# Patient Record
Sex: Male | Born: 1955
Health system: Southern US, Community
[De-identification: ages and names within clinical notes are randomized; demographics above are authoritative.]

## PROBLEM LIST (undated history)

## (undated) DIAGNOSIS — F329 Major depressive disorder, single episode, unspecified: Secondary | ICD-10-CM

## (undated) DIAGNOSIS — Z85828 Personal history of other malignant neoplasm of skin: Secondary | ICD-10-CM

## (undated) DIAGNOSIS — F32A Depression, unspecified: Secondary | ICD-10-CM

## (undated) DIAGNOSIS — A6002 Herpesviral infection of other male genital organs: Secondary | ICD-10-CM

## (undated) DIAGNOSIS — Z8619 Personal history of other infectious and parasitic diseases: Secondary | ICD-10-CM

## (undated) DIAGNOSIS — T7840XA Allergy, unspecified, initial encounter: Secondary | ICD-10-CM

## (undated) DIAGNOSIS — F1020 Alcohol dependence, uncomplicated: Secondary | ICD-10-CM

## (undated) DIAGNOSIS — R569 Unspecified convulsions: Secondary | ICD-10-CM

## (undated) DIAGNOSIS — C801 Malignant (primary) neoplasm, unspecified: Secondary | ICD-10-CM

## (undated) HISTORY — DX: Unspecified convulsions: R56.9

## (undated) HISTORY — DX: Malignant (primary) neoplasm, unspecified: C80.1

## (undated) HISTORY — DX: Allergy, unspecified, initial encounter: T78.40XA

## (undated) HISTORY — DX: Depression, unspecified: F32.A

## (undated) HISTORY — PX: FOOT FRACTURE SURGERY: SHX645

## (undated) HISTORY — PX: VASECTOMY: SHX75

## (undated) HISTORY — DX: Personal history of other malignant neoplasm of skin: Z85.828

## (undated) HISTORY — DX: Alcohol dependence, uncomplicated: F10.20

## (undated) HISTORY — PX: NOSE SURGERY: SHX723

## (undated) HISTORY — DX: Personal history of other infectious and parasitic diseases: Z86.19

## (undated) HISTORY — DX: Herpesviral infection of other male genital organs: A60.02

---
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
## 2001-06-15 ENCOUNTER — Ambulatory Visit (HOSPITAL_BASED_OUTPATIENT_CLINIC_OR_DEPARTMENT_OTHER): Admission: RE | Admit: 2001-06-15 | Discharge: 2001-06-15 | Payer: Self-pay | Admitting: Orthopedic Surgery

## 2001-10-21 ENCOUNTER — Encounter: Payer: Self-pay | Admitting: Emergency Medicine

## 2001-10-21 ENCOUNTER — Emergency Department (HOSPITAL_COMMUNITY): Admission: EM | Admit: 2001-10-21 | Discharge: 2001-10-21 | Payer: Self-pay | Admitting: Emergency Medicine

## 2002-04-25 ENCOUNTER — Emergency Department (HOSPITAL_COMMUNITY): Admission: EM | Admit: 2002-04-25 | Discharge: 2002-04-25 | Payer: Self-pay | Admitting: Emergency Medicine

## 2002-10-26 ENCOUNTER — Emergency Department (HOSPITAL_COMMUNITY): Admission: EM | Admit: 2002-10-26 | Discharge: 2002-10-26 | Payer: Self-pay | Admitting: Emergency Medicine

## 2002-10-27 ENCOUNTER — Ambulatory Visit (HOSPITAL_BASED_OUTPATIENT_CLINIC_OR_DEPARTMENT_OTHER): Admission: RE | Admit: 2002-10-27 | Discharge: 2002-10-27 | Payer: Self-pay | Admitting: Orthopedic Surgery

## 2003-01-07 ENCOUNTER — Emergency Department (HOSPITAL_COMMUNITY): Admission: EM | Admit: 2003-01-07 | Discharge: 2003-01-07 | Payer: Self-pay | Admitting: Emergency Medicine

## 2006-02-19 ENCOUNTER — Emergency Department (HOSPITAL_COMMUNITY): Admission: EM | Admit: 2006-02-19 | Discharge: 2006-02-19 | Payer: Self-pay | Admitting: Emergency Medicine

## 2010-10-03 ENCOUNTER — Emergency Department (HOSPITAL_COMMUNITY): Payer: Self-pay

## 2010-10-03 ENCOUNTER — Inpatient Hospital Stay (HOSPITAL_COMMUNITY)
Admission: EM | Admit: 2010-10-03 | Discharge: 2010-10-09 | DRG: 504 | Disposition: A | Discharge status: Home Health | Payer: Self-pay

## 2010-10-03 DIAGNOSIS — F1020 Alcohol dependence, uncomplicated: Secondary | ICD-10-CM | POA: Diagnosis present

## 2010-10-03 DIAGNOSIS — E876 Hypokalemia: Secondary | ICD-10-CM | POA: Diagnosis not present

## 2010-10-03 DIAGNOSIS — Z79899 Other long term (current) drug therapy: Secondary | ICD-10-CM

## 2010-10-03 DIAGNOSIS — F10939 Alcohol use, unspecified with withdrawal, unspecified: Secondary | ICD-10-CM | POA: Diagnosis present

## 2010-10-03 DIAGNOSIS — S92009A Unspecified fracture of unspecified calcaneus, initial encounter for closed fracture: Principal | ICD-10-CM | POA: Diagnosis present

## 2010-10-03 DIAGNOSIS — Y93H9 Activity, other involving exterior property and land maintenance, building and construction: Secondary | ICD-10-CM

## 2010-10-03 DIAGNOSIS — D62 Acute posthemorrhagic anemia: Secondary | ICD-10-CM | POA: Diagnosis not present

## 2010-10-03 DIAGNOSIS — Y92009 Unspecified place in unspecified non-institutional (private) residence as the place of occurrence of the external cause: Secondary | ICD-10-CM

## 2010-10-03 DIAGNOSIS — W138XXA Fall from, out of or through other building or structure, initial encounter: Secondary | ICD-10-CM | POA: Diagnosis present

## 2010-10-03 DIAGNOSIS — F10239 Alcohol dependence with withdrawal, unspecified: Secondary | ICD-10-CM | POA: Diagnosis present

## 2010-10-03 DIAGNOSIS — Z880 Allergy status to penicillin: Secondary | ICD-10-CM

## 2010-10-03 DIAGNOSIS — F17200 Nicotine dependence, unspecified, uncomplicated: Secondary | ICD-10-CM | POA: Diagnosis present

## 2010-10-03 LAB — BASIC METABOLIC PANEL
BUN: 6 mg/dL (ref 6–23)
CO2: 22 mEq/L (ref 19–32)
Calcium: 8.5 mg/dL (ref 8.4–10.5)
Chloride: 99 mEq/L (ref 96–112)
Creatinine, Ser: 0.56 mg/dL (ref 0.50–1.35)
GFR calc Af Amer: >60 mL/min (ref >60)
GFR calc non Af Amer: >60 mL/min (ref >60)
Glucose, Bld: 101 mg/dL — ABNORMAL HIGH (ref 70–99)
Potassium: 4 mEq/L (ref 3.5–5.1)
Sodium: 133 mEq/L — ABNORMAL LOW (ref 135–145)

## 2010-10-03 LAB — DIFFERENTIAL
Basophils Absolute: 0 10*3/uL (ref 0.0–0.1)
Basophils Relative: 0 % (ref 0–1)
Eosinophils Absolute: 0.1 10*3/uL (ref 0.0–0.7)
Eosinophils Relative: 1 % (ref 0–5)
Lymphocytes Relative: 21 % (ref 12–46)
Lymphs Abs: 2.9 10*3/uL (ref 0.7–4.0)
Monocytes Absolute: 0.9 10*3/uL (ref 0.1–1.0)
Monocytes Relative: 6 % (ref 3–12)
Neutro Abs: 10 10*3/uL — ABNORMAL HIGH (ref 1.7–7.7)
Neutrophils Relative %: 72 % (ref 43–77)

## 2010-10-03 LAB — ETHANOL: Alcohol, Ethyl (B): 236 mg/dL — ABNORMAL HIGH (ref 0–11)

## 2010-10-03 LAB — CBC
HCT: 42.9 % (ref 39.0–52.0)
Hemoglobin: 15.7 g/dL (ref 13.0–17.0)
MCH: 35 pg — ABNORMAL HIGH (ref 26.0–34.0)
MCHC: 36.6 g/dL — ABNORMAL HIGH (ref 30.0–36.0)
MCV: 95.8 fL (ref 78.0–100.0)
Platelets: 228 10*3/uL (ref 150–400)
RBC: 4.48 MIL/uL (ref 4.22–5.81)
RDW: 12.1 % (ref 11.5–15.5)
WBC: 14 10*3/uL — ABNORMAL HIGH (ref 4.0–10.5)

## 2010-10-03 LAB — SAMPLE TO BLOOD BANK

## 2010-10-04 ENCOUNTER — Inpatient Hospital Stay (HOSPITAL_COMMUNITY): Payer: Self-pay

## 2010-10-04 LAB — BASIC METABOLIC PANEL
BUN: 5 mg/dL — ABNORMAL LOW (ref 6–23)
CO2: 22 mEq/L (ref 19–32)
Calcium: 8.6 mg/dL (ref 8.4–10.5)
Chloride: 105 mEq/L (ref 96–112)
Creatinine, Ser: <0.47 mg/dL — ABNORMAL LOW (ref 0.50–1.35)
Glucose, Bld: 94 mg/dL (ref 70–99)
Potassium: 4.1 mEq/L (ref 3.5–5.1)
Sodium: 140 mEq/L (ref 135–145)

## 2010-10-04 LAB — SURGICAL PCR SCREEN
MRSA, PCR: NEGATIVE
Staphylococcus aureus: POSITIVE — AB

## 2010-10-04 LAB — CBC
HCT: 38.7 % — ABNORMAL LOW (ref 39.0–52.0)
Hemoglobin: 13.8 g/dL (ref 13.0–17.0)
MCH: 34.3 pg — ABNORMAL HIGH (ref 26.0–34.0)
MCHC: 35.7 g/dL (ref 30.0–36.0)
MCV: 96.3 fL (ref 78.0–100.0)
Platelets: 210 10*3/uL (ref 150–400)
RBC: 4.02 MIL/uL — ABNORMAL LOW (ref 4.22–5.81)
RDW: 12.3 % (ref 11.5–15.5)
WBC: 12.1 10*3/uL — ABNORMAL HIGH (ref 4.0–10.5)
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
## 2010-10-05 ENCOUNTER — Inpatient Hospital Stay (HOSPITAL_COMMUNITY): Payer: Self-pay

## 2010-10-06 LAB — BASIC METABOLIC PANEL
Calcium: 8.5 mg/dL (ref 8.4–10.5)
Chloride: 101 mEq/L (ref 96–112)
Creatinine, Ser: 0.48 mg/dL — ABNORMAL LOW (ref 0.50–1.35)
GFR calc Af Amer: >60 mL/min (ref >60)
Sodium: 137 mEq/L (ref 135–145)

## 2010-10-06 LAB — CBC
HCT: 30.3 % — ABNORMAL LOW (ref 39.0–52.0)
Hemoglobin: 10.8 g/dL — ABNORMAL LOW (ref 13.0–17.0)
MCH: 34.2 pg — ABNORMAL HIGH (ref 26.0–34.0)
MCHC: 35.6 g/dL (ref 30.0–36.0)
MCV: 95.9 fL (ref 78.0–100.0)
Platelets: 151 10*3/uL (ref 150–400)
RBC: 3.16 MIL/uL — ABNORMAL LOW (ref 4.22–5.81)
RDW: 11.8 % (ref 11.5–15.5)
WBC: 13.4 10*3/uL — ABNORMAL HIGH (ref 4.0–10.5)

## 2010-10-07 LAB — COMPREHENSIVE METABOLIC PANEL
ALT: 20 U/L (ref 0–53)
Albumin: 2.9 g/dL — ABNORMAL LOW (ref 3.5–5.2)
Alkaline Phosphatase: 70 U/L (ref 39–117)
Chloride: 102 mEq/L (ref 96–112)
Potassium: 3.1 mEq/L — ABNORMAL LOW (ref 3.5–5.1)
Sodium: 139 mEq/L (ref 135–145)
Total Bilirubin: 1.4 mg/dL — ABNORMAL HIGH (ref 0.3–1.2)
Total Protein: 6.1 g/dL (ref 6.0–8.3)

## 2010-10-07 LAB — CBC
HCT: 30.3 % — ABNORMAL LOW (ref 39.0–52.0)
Platelets: 186 10*3/uL (ref 150–400)
RBC: 3.2 MIL/uL — ABNORMAL LOW (ref 4.22–5.81)
RDW: 11.6 % (ref 11.5–15.5)
WBC: 10 10*3/uL (ref 4.0–10.5)
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
## 2010-10-16 ENCOUNTER — Encounter (HOSPITAL_COMMUNITY)
Admission: RE | Admit: 2010-10-16 | Discharge: 2010-10-16 | Disposition: A | Discharge status: Home / Self Care | Payer: Self-pay | Source: Ambulatory Visit | Attending: Orthopedic Surgery | Admitting: Orthopedic Surgery

## 2010-10-16 LAB — BASIC METABOLIC PANEL
BUN: 3 mg/dL — ABNORMAL LOW (ref 6–23)
Calcium: 9.6 mg/dL (ref 8.4–10.5)
Creatinine, Ser: 0.49 mg/dL — ABNORMAL LOW (ref 0.50–1.35)
GFR calc Af Amer: >60 mL/min (ref >60)
GFR calc non Af Amer: >60 mL/min (ref >60)

## 2010-10-16 LAB — CBC
HCT: 38.7 % — ABNORMAL LOW (ref 39.0–52.0)
MCH: 33.7 pg (ref 26.0–34.0)
MCHC: 35.7 g/dL (ref 30.0–36.0)
MCV: 94.4 fL (ref 78.0–100.0)
RDW: 12.5 % (ref 11.5–15.5)

## 2010-10-16 LAB — SURGICAL PCR SCREEN: Staphylococcus aureus: NEGATIVE

## 2010-10-18 ENCOUNTER — Ambulatory Visit (HOSPITAL_COMMUNITY): Payer: Self-pay

## 2010-10-18 ENCOUNTER — Ambulatory Visit (HOSPITAL_COMMUNITY)
Admission: RE | Admit: 2010-10-18 | Discharge: 2010-10-19 | Disposition: A | Discharge status: Home / Self Care | Payer: Self-pay | Source: Ambulatory Visit | Attending: Orthopedic Surgery | Admitting: Orthopedic Surgery

## 2010-10-18 DIAGNOSIS — Y999 Unspecified external cause status: Secondary | ICD-10-CM | POA: Insufficient documentation

## 2010-10-18 DIAGNOSIS — W138XXA Fall from, out of or through other building or structure, initial encounter: Secondary | ICD-10-CM | POA: Insufficient documentation

## 2010-10-18 DIAGNOSIS — S92009A Unspecified fracture of unspecified calcaneus, initial encounter for closed fracture: Secondary | ICD-10-CM | POA: Insufficient documentation

## 2010-10-18 DIAGNOSIS — F17200 Nicotine dependence, unspecified, uncomplicated: Secondary | ICD-10-CM | POA: Insufficient documentation

## 2010-10-18 DIAGNOSIS — Z01812 Encounter for preprocedural laboratory examination: Secondary | ICD-10-CM | POA: Insufficient documentation
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
## 2010-10-31 ENCOUNTER — Inpatient Hospital Stay (HOSPITAL_COMMUNITY)
Admission: AD | Admit: 2010-10-31 | Discharge: 2010-11-04 | DRG: 909 | Disposition: A | Discharge status: Home Health | Payer: Self-pay | Source: Ambulatory Visit | Attending: Orthopedic Surgery | Admitting: Orthopedic Surgery

## 2010-10-31 DIAGNOSIS — Y849 Medical procedure, unspecified as the cause of abnormal reaction of the patient, or of later complication, without mention of misadventure at the time of the procedure: Secondary | ICD-10-CM | POA: Diagnosis present

## 2010-10-31 DIAGNOSIS — F17200 Nicotine dependence, unspecified, uncomplicated: Secondary | ICD-10-CM | POA: Diagnosis present

## 2010-10-31 DIAGNOSIS — T8131XA Disruption of external operation (surgical) wound, not elsewhere classified, initial encounter: Principal | ICD-10-CM | POA: Diagnosis present

## 2010-10-31 DIAGNOSIS — IMO0002 Reserved for concepts with insufficient information to code with codable children: Secondary | ICD-10-CM

## 2010-10-31 DIAGNOSIS — F1010 Alcohol abuse, uncomplicated: Secondary | ICD-10-CM | POA: Diagnosis present

## 2010-10-31 LAB — CBC
HCT: 41.2 % (ref 39.0–52.0)
Hemoglobin: 14.6 g/dL (ref 13.0–17.0)
MCHC: 35.4 g/dL (ref 30.0–36.0)
MCV: 92.4 fL (ref 78.0–100.0)

## 2010-11-01 ENCOUNTER — Inpatient Hospital Stay (HOSPITAL_COMMUNITY): Admission: RE | Admit: 2010-11-01 | Payer: Self-pay | Source: Ambulatory Visit | Admitting: Orthopedic Surgery

## 2010-11-01 LAB — BASIC METABOLIC PANEL
CO2: 29 mEq/L (ref 19–32)
Calcium: 9.6 mg/dL (ref 8.4–10.5)
GFR calc non Af Amer: >60 mL/min (ref >60)
Sodium: 139 mEq/L (ref 135–145)

## 2010-11-01 LAB — PROTIME-INR: INR: 1.03 (ref 0.00–1.49)

## 2010-11-01 LAB — SURGICAL PCR SCREEN: Staphylococcus aureus: POSITIVE — AB

## 2010-11-02 NOTE — H&P (Signed)
  NAMEJAZZ, Edwin Torres NO.:  [id number]  MEDICAL RECORD NO.:  [id number]  LOCATION:  5033                         FACILITY:  [facility]  PHYSICIAN:  Toni Arthurs, MD        DATE OF BIRTH:  04-24-1955  DATE OF ADMISSION:  10/31/2010 DATE OF DISCHARGE:                             HISTORY & PHYSICAL   HISTORY OF PRESENT ILLNESS:  The patient is a 55 year old male who is now 2 weeks status post ORIF of his left calcaneus fracture.  He presented to clinic today for a wound check and convert into a cast.  He was found to have a dehisced left heel wound and is admitted now for I and D and application of a wound VAC.  ADMISSION DIAGNOSES: 1. Left foot wound dehiscence status post open reduction and internal     fixation of left calcaneus fracture. 2. Status post open reduction and internal fixation right calcaneus     fracture. 3. History of alcohol abuse. 4. History of tobacco use. 5. History of alcohol withdrawal syndrome on his most recent     admission.  PAST SURGICAL HISTORY:  As above, ORIF of multiple facial fractures in the past.  SOCIAL HISTORY:  The patient is self-employed.  He smokes about three cigarettes a day.  Currently, he drinks 4-6 beers a day.  FAMILY HISTORY:  Unknown.  REVIEW OF SYSTEMS:  No recent fever, chills, nausea, vomiting or changes in his appetite.  Review of systems as above and otherwise normal.  MEDICATIONS:  Admission medications Xarelto, Dilaudid, Tylenol.  PHYSICAL EXAMINATION:  GENERAL:  The patient is a well-nourished, well- developed male in no apparent distress, alert and oriented x4. EYES:  Extraocular motions are intact. RESPIRATIONS:  Unlabored. NEUROLOGIC:  His gait is nonweightbearing on both lower extremities.  He has a splint on his left lower extremity and a cast on the right.  The left lower extremity cast is removed.  The posterolateral heel incision has extensive necrosis along the suture line with significant  fibrinous exudate.  The posterolateral corner has a fairly deep hole but no metal is evident.  There is no evidence of cellulitis surrounding this area. Pulses are palpable.  Sensibility to light touch is intact in the medial and lateral plantar nerve distribution.  No lymphadenopathy is noted. He has 5/5 strength.  Plantar flexion and dorsiflexion at the ankle.  ASSESSMENT:  Wound dehiscence left foot status post open reduction and internal fixation of left heel.  PLAN:  At this point, the patient is admitted to the inpatient ward 5000 at [facility] directly from clinic.  Will be n.p.o. overnight.  He will be taken to surgery tomorrow for I and D of the left heel wound with likely application of a wound VAC.  He understands this plan and agrees.     Toni Arthurs, MD     JH/MEDQ  D:  10/31/2010  T:  11/01/2010  Job:  [id number]  Electronically Signed by Toni Arthurs  on 11/02/2010 08:51:20 AM

## 2010-11-02 NOTE — Op Note (Signed)
NAMENICCOLAS, Torres NO.:  [id number]  MEDICAL RECORD NO.:  [id number]  LOCATION:  5033                         FACILITY:  [facility]  PHYSICIAN:  Toni Arthurs, MD        DATE OF BIRTH:  [birth year]  DATE OF PROCEDURE:  11/01/2010 DATE OF DISCHARGE:                              OPERATIVE REPORT   PREOPERATIVE DIAGNOSIS:  Left foot wound dehiscence status post open reduction and internal fixation of left calcaneus fracture.  POSTOPERATIVE DIAGNOSIS:  Left foot wound dehiscence status post open reduction and internal fixation of left calcaneus fracture.  PROCEDURE: 1. Incision and drainage of left foot, postoperative wound including     skin, subcutaneous tissue, muscle, and bone. 2. Application of wound VAC to left foot wound.  SURGEON:  Toni Arthurs, MD  ANESTHESIA:  General.  IV FLUIDS:  See anesthesia record.  ESTIMATED BLOOD LOSS:  Minimal.  COMPLICATIONS:  None apparent.  DISPOSITION:  Extubated, awakened, stable to recovery.  INDICATIONS FOR PROCEDURE:  The patient is a 55 year old male who is now 2 weeks status post ORIF of his left calcaneus fracture.  He has a long history of cigarette smoking and has continued to smoke in the postoperative period.  He presented to clinic yesterday for routine followup and was found to have dehisced left hindfoot wound.  He was admitted to the hospital, was started on vancomycin given his PENICILLIN allergy.  He presents now for I and D of this wound and application of a wound VAC.  He understands the risks and benefits of this procedure including risks of bleeding, infection, nerve damage, blood clots, need for additional surgery, amputation and death.  He elects to proceed.  PROCEDURE IN DETAIL:  After preoperative consent was obtained and the correct operative site was identified, the patient was brought to the operating room and placed supine on the operating table.  General anesthesia was induced.  The  patient was already on therapeutic antibiotics.  A surgical time-out was taken.  The patient was then turned into the lateral decubitus position with the left side up.  The left lower extremity was prepped and draped in standard sterile fashion. The patient's previous surgical incision was reopened.  There was fibrinous exudate noted at the superficial aspect of the wound, but no gross purulence was noted.  There is no evidence of surrounding cellulitis.  The wound edges appeared necrotic.  The wound was circumferentially debrided from the level of the skin down through the level of subcutaneous tissue, muscle, and bone.  The wound was irrigated copiously with 3 liters of normal saline.  Hemostasis was achieved. Inverted simple sutures of #2 PDS were used to approximate the subcutaneous tissue.  The distal and proximal ends of the wound were closed in tension-free manner with horizontal mattress sutures of 3-0 nylon.  The most central portion of the wound on the inferior end could not be closed due to the extensive skin loss in this area.  A wound VAC sponge was cut to fit this hole, which measured 1 cm x 27 mm x 1 cm deep.  The proximal and distal extent of the suture lines were covered with Adaptic.  The wound VAC sponge was cut to fit the full length of the incision and a sponge was placed in the central wound and then over the pieces of Adaptic over the proximal and distal ends of the incisions.  The wound VAC occlusive dressing was applied followed by 125 mmHg of vacuum.  An appropriate seal was achieved.  Sterile dressings were applied followed by a posterior splint.  The patient was then awakened by Anesthesia and transported to recovery room in stable condition.  FOLLOWUP PLAN:  The patient will be discharged home on oral antibiotics. He will have wound VAC changes on Monday, Wednesday, Friday and will follow up with me in approximately 2 weeks.     Toni Arthurs,  MD     JH/MEDQ  D:  11/01/2010  T:  11/02/2010  Job:  [id number]  Electronically Signed by Jonny Ruiz Kalle Bernath  on 11/02/2010 08:51:23 AM

## 2010-11-04 LAB — VANCOMYCIN, TROUGH: Vancomycin Tr: 7.9 ug/mL — ABNORMAL LOW (ref 10.0–20.0)

## 2010-11-04 NOTE — Discharge Summary (Signed)
NAMESTEEL, Edwin Torres  MEDICAL RECORD NO.:  [id number]  LOCATION:  5022                         FACILITY:  [facility]  PHYSICIAN:  Edwin Torres, M.D.    DATE OF BIRTH:  November 23, 1955  DATE OF ADMISSION:  10/03/2010 DATE OF DISCHARGE:  10/09/2010                              DISCHARGE SUMMARY   The patient was discharged home with Home Health Services and followup.  ADMITTING TRAUMA SURGEON:  Juanetta Gosling, MD  CONSULTANTS:  Toni Arthurs, MD and Almedia Balls. Ranell Patrick, MD, Orthopedic Surgery.  PROCEDURES:  ORIF right calcaneus fracture and splinting of left calcaneus fracture on October 05, 2010 with Dr. Victorino Dike.  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  He reports hearing pop in both of his ankles upon landing.  He is complaining of severe pain in both his feet.  He has no other complaints of pain.  Workup in the ED including bilateral lower extremity x-rays did show bilateral severely comminuted calcaneal fractures.  He had plain films of his cervical and lumbar spine which were negative for acute fractures.  The patient was admitted to the Trauma Service.  He was seen in consultation by Dr. Ranell Patrick of Orthopedic Surgery and was admitted for pain control and probable surgery.  He was initially started on a PCA. He did have significant history of EtOH use and was started on an Ativan, alcohol withdrawal protocol as well as allowed to drink beer with which he was normal.  He was then seen in consultation by Dr. Victorino Dike and it was felt he would benefit from ORIF and he was taken to the OR for ORIF of his right calcaneus fracture and application of a short-leg splint to the left lower extremity per Dr. Victorino Dike on October 05, 2010.  The left lower extremity could not be operated at this time due to skin issues  and the patient was maintained nonweightbearing on both lower extremities.  He was mobilized bed to chair with therapies and is doing well enough that he can be discharged home with the assistance of his family at this point.  He did have a lot of pain control issues and was started on OxyContin sustained release 20 mg b.i.d. by Dr. Victorino Dike as well as Lisabeth Devoid for breakthrough pain and Celebrex.  He will be discharged on all these medications.  He will be following up with Dr. Victorino Dike early next week to follow up on the ORIF of his right ankle and for wound recheck on the left side as this will likely need to be done in delayed fashion.  Alcohol withdrawal syndrome.  The patient did develop significant alcohol withdrawal syndrome in the postoperative course.  This took several days to clear and once he cleared sufficiently, he was able to fully participate in therapies and is ready at this point for discharge home.  MEDICATIONS AT TIME OF DISCHARGE: 1. Celebrex 200 mg p.o. b.i.d.  He was given a prescription for 60, no     refill. 2.  Benadryl 25 mg to 50 mg p.o. q.6 h. p.r.n. itching. 3. Colace 100 mg p.o. b.i.d. 4. Multivitamin 1 p.o. daily. 5. Oxycodone 20 mg sustained release tablets b.i.d., #20, no refill.     Further refills will need to be per Dr. Victorino Dike as he feels     necessary. 6. OxyIR 5 mg tablets, 10-15 mg p.o. q.3 h. p.r.n. breakthrough pain,     #80, no refill. 7. Senna tabs 2 daily.  Again the patient is going to follow up with Dr. Victorino Dike early next week. He will follow up with Trauma Service on as-needed basis.  He is nonweightbearing, bilateral lower extremities.  Diet is regular as tolerated.  He was seen by the social worker during this admission for multiple issues but substance abuse was also addressed.  The patient will pursue this community resources for assistance with alcohol dependence after discharge as he does wish some assistance with this.  It was  discussed with both the patient and his wife that he should not abruptly stop drinking as he had significant symptoms of alcohol withdrawal syndrome and this could be potentially life threatening.     Shawn Rayburn, P.A.   ______________________________ Edwin Torres, M.D.    SR/MEDQ  D:  10/09/2010  T:  10/09/2010  Job:  [id number]  cc:   Toni Arthurs, MD [facility] Surgery  Electronically Signed by Lazaro Arms P.A. on 10/15/2010 06:09:27 PM Electronically Signed by Jimmye Norman M.D. on 11/04/2010 08:46:47 AM

## 2010-11-07 NOTE — Discharge Summary (Signed)
  NAMEBRAYLEE, Edwin Torres NO.:  [id number]  MEDICAL RECORD NO.:  [id number]  LOCATION:  5033                         FACILITY:  [facility]  PHYSICIAN:  Toni Arthurs, MD        DATE OF BIRTH:  03-05-1955  DATE OF ADMISSION:  10/31/2010 DATE OF DISCHARGE:  11/04/2010                              DISCHARGE SUMMARY   ADMISSION DIAGNOSES: 1. Left foot wound dehiscence status post open reduction and internal     fixation of the left calcaneus fracture. 2. Status post open reduction and internal fixation right calcaneus     fracture. 3. History of alcohol abuse. 4. History of tobacco use. 5. History of alcohol withdrawal syndrome.  DISCHARGE DIAGNOSES: 1. Left foot wound dehiscence status post open reduction and internal     fixation of the left calcaneus fracture. 2. Status post open reduction and internal fixation right calcaneus     fracture. 3. History of alcohol abuse. 4. History of tobacco use. 5. History of alcohol withdrawal syndrome. 6. Status post incision and drainage of left foot wound with     application of wound VAC.  HOSPITAL COURSE:  The patient was admitted to the hospital on October 31, 2010, directly from clinic.  He was found to have a dehiscence of his left heel wound.  He was taken to surgery the following day where he underwent I and D of the left foot wound with application of a wound VAC.  He has been maintained on IV vancomycin, then transitioned to oral Bactrim.  He now has home health wound VAC treatment set up and is stable for discharge.  He is discharged home in good condition tonight.  DISCHARGE CONDITION:  Stable.  WEIGHTBEARING STATUS:  Nonweightbearing bilateral lower extremities.  FOLLOWUP PLAN:  The patient will have the left foot wound VAC changed Monday, Wednesday, Friday by [facility].  He will go back on his oral anticoagulant once a day.  He will be on Bactrim DS one p.o. b.i.d.  He will follow up with me on  November 15, 2010, in clinic.  DISCHARGE MEDICATIONS:  See medication reconciliation.     Toni Arthurs, MD     JH/MEDQ  D:  11/04/2010  T:  11/05/2010  Job:  [id number]  Electronically Signed by Toni Arthurs  on 11/07/2010 03:29:09 PM

## 2012-06-09 IMAGING — RF DG OS CALCIS 2+V*L*
1 series · 3 of 3 positions shown · non-contrast
Comparison: 10/03/2010.

CLINICAL DATA: ORIF left calcaneal fracture.

LEFT OS CALCIS - 2+ VIEW

[Series 1: run · 3 of 3 slices shown]
[im 1/3]
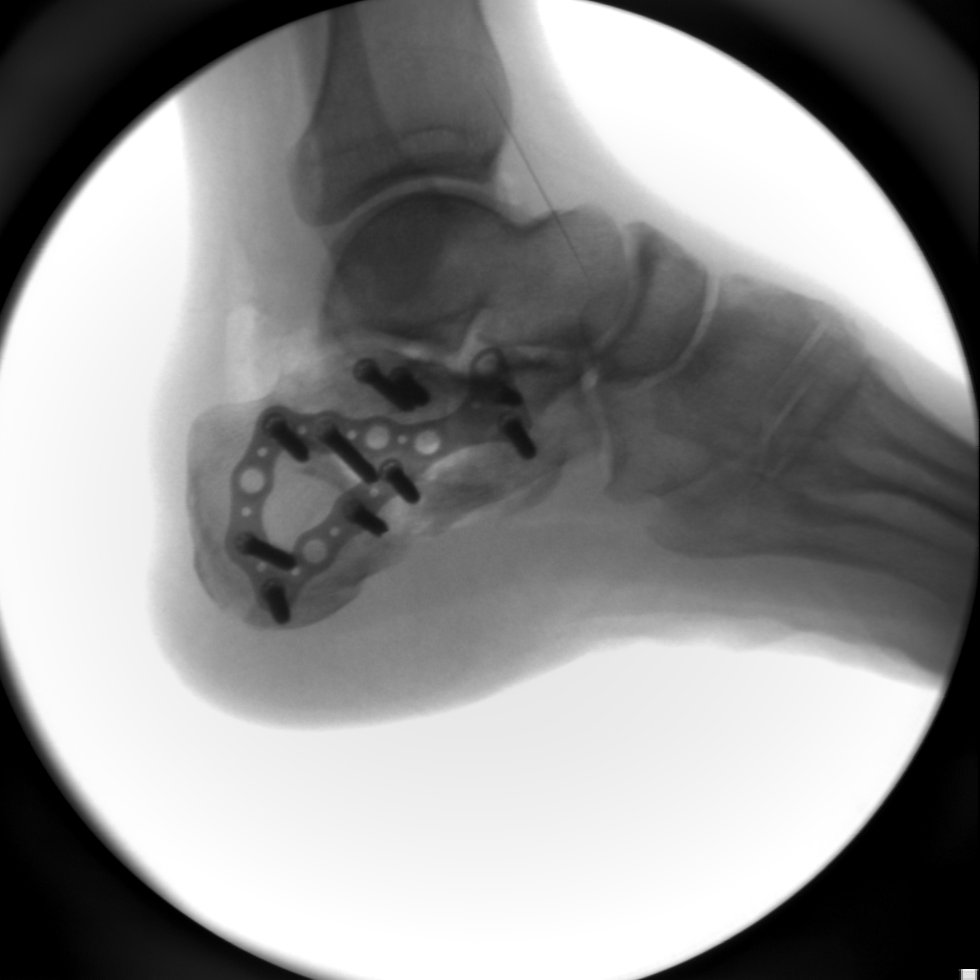
[im 2/3]
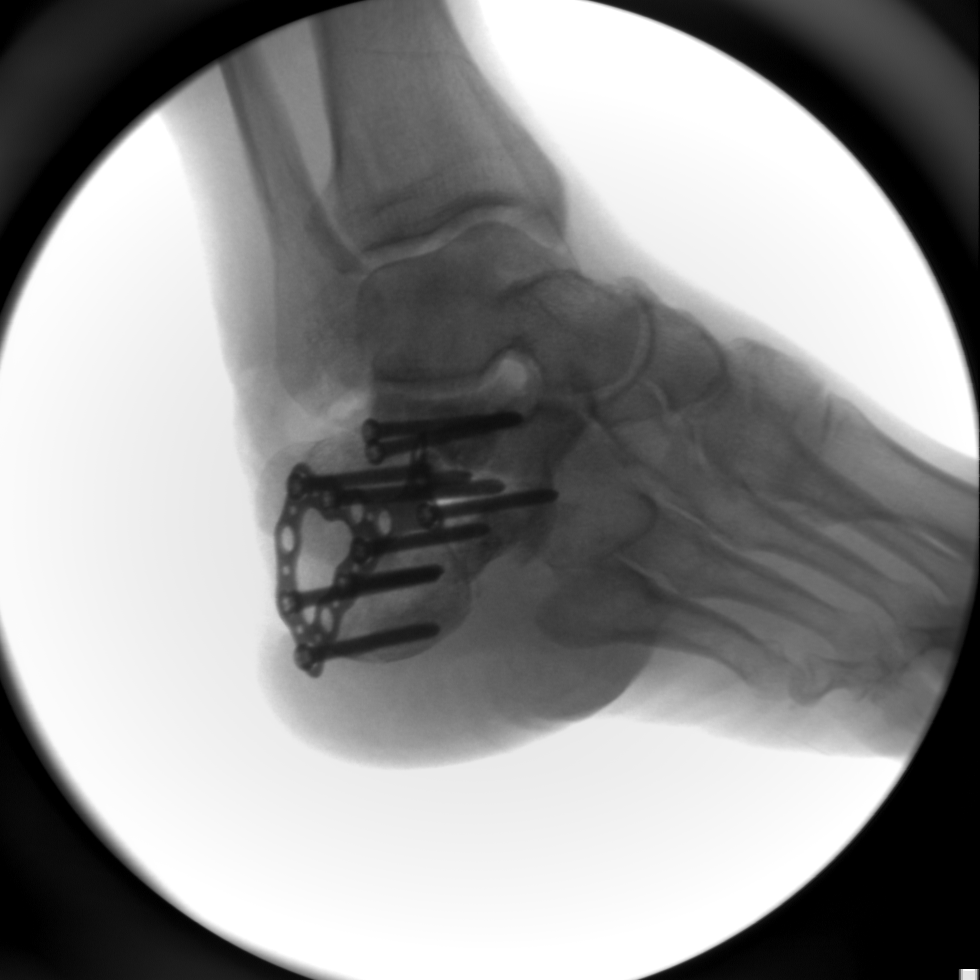
[im 3/3]
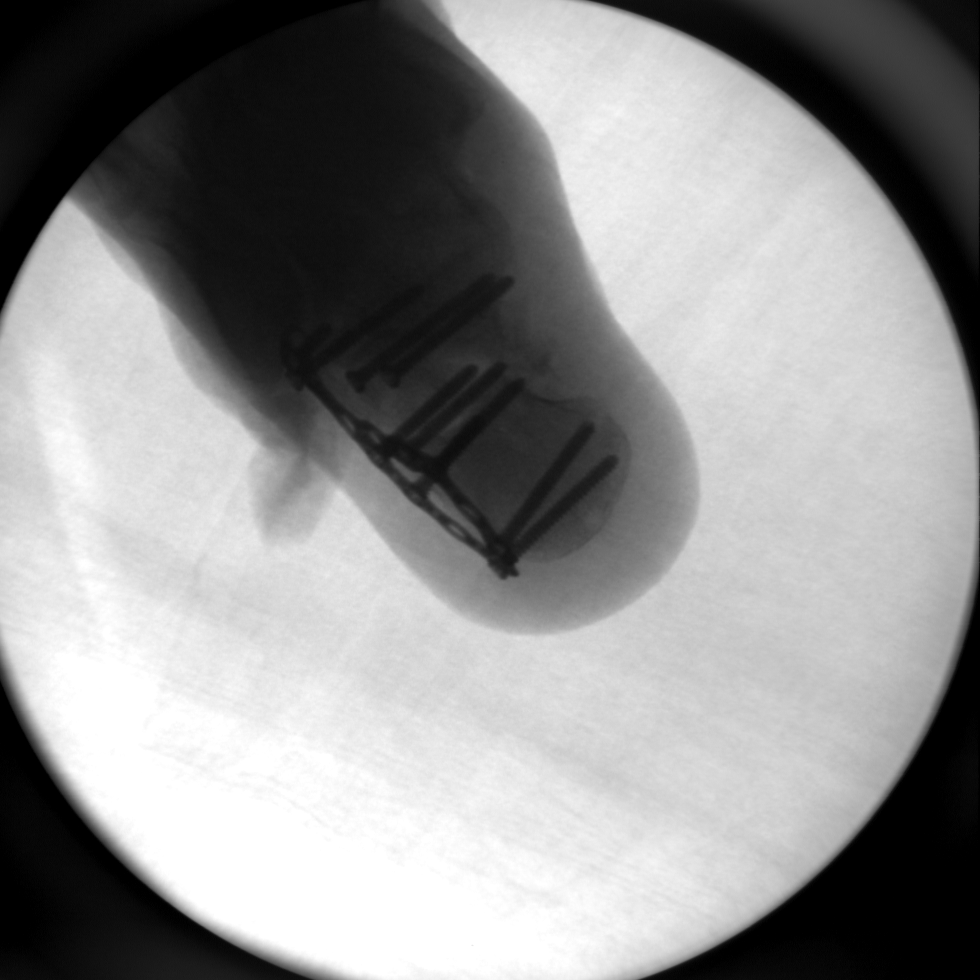

[3 of 3 positions shown; findings below may reference images not displayed]

FINDINGS: Three intraoperative fluoroscopic spot films demonstrate
ORIF of comminuted calcaneal fracture of the lateral plate and
screw fixation.  There are at least 2 screws that do not extend
through the calcaneal plate, compatible with interfragmentary
screws.  Expected soft tissue changes.
IMPRESSION: ORIF left calcaneus.

## 2016-03-10 ENCOUNTER — Ambulatory Visit (INDEPENDENT_AMBULATORY_CARE_PROVIDER_SITE_OTHER): Payer: PPO | Admitting: Emergency Medicine

## 2016-03-10 VITALS — BP 140/80 | HR 110 | Temp 97.7°F | Resp 18 | Ht 66.0 in | Wt 140.0 lb

## 2016-03-10 DIAGNOSIS — H1031 Unspecified acute conjunctivitis, right eye: Secondary | ICD-10-CM

## 2016-03-10 MED ORDER — TOBRAMYCIN 0.3 % OP SOLN: 2.0000 [drp] | Freq: Four times a day (QID) | OPHTHALMIC | 0 refills | Status: AC

## 2016-03-10 MED ORDER — PREDNISONE 20 MG PO TABS: 40.0000 mg | ORAL_TABLET | Freq: Every day | ORAL | 0 refills | Status: AC

## 2016-03-10 NOTE — Progress Notes (Signed)
Edwin Torres 61 y.o.   Chief Complaint  Patient presents with  . eye infection    2 weeks    HISTORY OF PRESENT ILLNESS: This is a 61 y.o. male complaining of redness, itching, and discharge to right eye for several days.  Conjunctivitis   The current episode started 3 to 5 days ago. The onset was gradual. The problem occurs continuously. The problem has been gradually worsening. The problem is mild. Associated symptoms include eye itching, congestion, eye discharge and eye redness. Pertinent negatives include no fever, no decreased vision, no double vision, no photophobia, no abdominal pain, no nausea, no vomiting, no headaches, no rhinorrhea, no sore throat, no neck pain, no neck stiffness, no cough, no URI, no rash and no eye pain. The eye pain is mild. The right eye is affected. The eye pain is not associated with movement. The eyelid exhibits swelling.     Prior to Admission medications   Not on File    Allergies  Allergen Reactions  . Penicillins Anaphylaxis    There are no active problems to display for this patient.   Past Medical History:  Diagnosis Date  . Seizures ([place])     Past Surgical History:  Procedure Laterality Date  . VASECTOMY      Social History   Social History  . Marital status: Married    Spouse name: N/A  . Number of children: N/A  . Years of education: N/A   Occupational History  . Not on file.   Social History Main Topics  . Smoking status: Current Every Day Smoker  . Smokeless tobacco: Never Used  . Alcohol use Yes  . Drug use: No  . Sexual activity: Not on file   Other Topics Concern  . Not on file   Social History Narrative  . No narrative on file    Family History  Problem Relation Age of Onset  . Cancer Mother   . Diabetes Mother   . Hyperlipidemia Father      Review of Systems  Constitutional: Negative for chills and fever.  HENT: Positive for congestion. Negative for rhinorrhea and sore throat.   Eyes:  Positive for discharge, redness and itching. Negative for blurred vision, double vision, photophobia and pain.  Respiratory: Negative for cough.   Cardiovascular: Negative for chest pain and palpitations.  Gastrointestinal: Negative for abdominal pain, nausea and vomiting.  Genitourinary: Negative.   Musculoskeletal: Negative for neck pain.  Skin: Negative for rash.  Neurological: Negative for headaches.  Endo/Heme/Allergies: Negative.   Psychiatric/Behavioral: Negative.    Vitals:   03/10/16 1727  BP: 140/80  Pulse: (!) 110  Resp: 18  Temp: 97.7 F (36.5 C)    Physical Exam  Constitutional: He is oriented to person, place, and time. He appears well-developed and well-nourished.  HENT:  Head: Normocephalic and atraumatic.  Eyes: EOM are normal. Pupils are equal, round, and reactive to light. Right conjunctiva is injected.  Right eyelid edema  Neck: Normal range of motion. Neck supple.  Cardiovascular: Normal rate, regular rhythm and normal heart sounds.   Pulmonary/Chest: Effort normal and breath sounds normal.  Musculoskeletal: Normal range of motion.  Neurological: He is alert and oriented to person, place, and time.  Skin: Skin is warm and dry.  Psychiatric: He has a normal mood and affect. His behavior is normal.  Vitals reviewed.    ASSESSMENT & PLAN: Edwin Torres was seen today for eye infection.  Diagnoses and all orders for this visit:  Acute conjunctivitis of right eye, unspecified acute conjunctivitis type -     Ambulatory referral to Ophthalmology  Other orders -     tobramycin (TOBREX) 0.3 % ophthalmic solution; Place 2 drops into the right eye every 6 (six) hours. -     predniSONE (DELTASONE) 20 MG tablet; Take 2 tablets (40 mg total) by mouth daily with breakfast.    Patient Instructions       IF you received an x-ray today, you will receive an invoice from [facility]. Please contact [facility] at [phone] with questions or  concerns regarding your invoice.   IF you received labwork today, you will receive an invoice from [facility]. Please contact [facility] at [phone] with questions or concerns regarding your invoice.   Our billing staff will not be able to assist you with questions regarding bills from these companies.  You will be contacted with the lab results as soon as they are available. The fastest way to get your results is to activate your My Chart account. Instructions are located on the last page of this paperwork. If you have not heard from [place] regarding the results in 2 weeks, please contact this office.     Bacterial Conjunctivitis Introduction Bacterial conjunctivitis is an infection of your conjunctiva. This is the clear membrane that covers the white part of your eye and the inner surface of your eyelid. This condition can make your eye:  Red or pink.  Itchy. This condition is caused by bacteria. This condition spreads very easily from person to person (is contagious) and from one eye to the other eye. Follow these instructions at home: Medicines  Take or apply your antibiotic medicine as told by your doctor. Do not stop taking or applying the antibiotic even if you start to feel better.  Take or apply over-the-counter and prescription medicines only as told by your doctor.  Do not touch your eyelid with the eye drop bottle or the ointment tube. Managing discomfort  Wipe any fluid from your eye with a warm, wet washcloth or a cotton ball.  Place a cool, clean washcloth on your eye. Do this for 10-20 minutes, 3-4 times per day. General instructions  Do not wear contact lenses until the irritation is gone. Wear glasses until your doctor says it is okay to wear contacts.  Do not wear eye makeup until your symptoms are gone. Throw away any old makeup.  Change or wash your pillowcase every day.  Do not share towels or washcloths with anyone.  Wash your hands often with soap and  water. Use paper towels to dry your hands.  Do not touch or rub your eyes.  Do not drive or use heavy machinery if your vision is blurry. Contact a doctor if:  You have a fever.  Your symptoms do not get better after 10 days. Get help right away if:  You have a fever and your symptoms suddenly get worse.  You have very bad pain when you move your eye.  Your face:  Hurts.  Is red.  Is swollen.  You have sudden loss of vision. This information is not intended to replace advice given to you by your health care provider. Make sure you discuss any questions you have with your health care provider. Document Released: 11/20/2007 Document Revised: 07/19/2015 Document Reviewed: 11/23/2014  2017 Elsevier      Agustina Caroli, MD [facility]

## 2016-03-10 NOTE — Patient Instructions (Addendum)
     IF you received an x-ray today, you will receive an invoice from [facility]. Please contact [facility] at [phone] with questions or concerns regarding your invoice.   IF you received labwork today, you will receive an invoice from [facility]. Please contact [facility] at [phone] with questions or concerns regarding your invoice.   Our billing staff will not be able to assist you with questions regarding bills from these companies.  You will be contacted with the lab results as soon as they are available. The fastest way to get your results is to activate your My Chart account. Instructions are located on the last page of this paperwork. If you have not heard from [place] regarding the results in 2 weeks, please contact this office.     Bacterial Conjunctivitis Introduction Bacterial conjunctivitis is an infection of your conjunctiva. This is the clear membrane that covers the white part of your eye and the inner surface of your eyelid. This condition can make your eye:  Red or pink.  Itchy. This condition is caused by bacteria. This condition spreads very easily from person to person (is contagious) and from one eye to the other eye. Follow these instructions at home: Medicines  Take or apply your antibiotic medicine as told by your doctor. Do not stop taking or applying the antibiotic even if you start to feel better.  Take or apply over-the-counter and prescription medicines only as told by your doctor.  Do not touch your eyelid with the eye drop bottle or the ointment tube. Managing discomfort  Wipe any fluid from your eye with a warm, wet washcloth or a cotton ball.  Place a cool, clean washcloth on your eye. Do this for 10-20 minutes, 3-4 times per day. General instructions  Do not wear contact lenses until the irritation is gone. Wear glasses until your doctor says it is okay to wear contacts.  Do not wear eye makeup until your symptoms are gone.  Throw away any old makeup.  Change or wash your pillowcase every day.  Do not share towels or washcloths with anyone.  Wash your hands often with soap and water. Use paper towels to dry your hands.  Do not touch or rub your eyes.  Do not drive or use heavy machinery if your vision is blurry. Contact a doctor if:  You have a fever.  Your symptoms do not get better after 10 days. Get help right away if:  You have a fever and your symptoms suddenly get worse.  You have very bad pain when you move your eye.  Your face:  Hurts.  Is red.  Is swollen.  You have sudden loss of vision. This information is not intended to replace advice given to you by your health care provider. Make sure you discuss any questions you have with your health care provider. Document Released: 11/20/2007 Document Revised: 07/19/2015 Document Reviewed: 11/23/2014  2017 Elsevier

## 2016-03-18 ENCOUNTER — Emergency Department (HOSPITAL_COMMUNITY)
Admission: EM | Admit: 2016-03-18 | Discharge: 2016-03-19 | Disposition: A | Discharge status: Home / Self Care | Payer: PPO | Attending: Emergency Medicine | Admitting: Emergency Medicine

## 2016-03-18 ENCOUNTER — Emergency Department (HOSPITAL_COMMUNITY): Payer: PPO

## 2016-03-18 ENCOUNTER — Encounter (HOSPITAL_COMMUNITY): Payer: Self-pay | Admitting: Emergency Medicine

## 2016-03-18 DIAGNOSIS — H02409 Unspecified ptosis of unspecified eyelid: Secondary | ICD-10-CM | POA: Diagnosis not present

## 2016-03-18 DIAGNOSIS — H4901 Third [oculomotor] nerve palsy, right eye: Secondary | ICD-10-CM | POA: Diagnosis not present

## 2016-03-18 DIAGNOSIS — H5711 Ocular pain, right eye: Secondary | ICD-10-CM | POA: Diagnosis not present

## 2016-03-18 DIAGNOSIS — H02401 Unspecified ptosis of right eyelid: Secondary | ICD-10-CM | POA: Diagnosis not present

## 2016-03-18 DIAGNOSIS — F17200 Nicotine dependence, unspecified, uncomplicated: Secondary | ICD-10-CM | POA: Insufficient documentation

## 2016-03-18 DIAGNOSIS — H2513 Age-related nuclear cataract, bilateral: Secondary | ICD-10-CM | POA: Diagnosis not present

## 2016-03-18 LAB — CBC WITH DIFFERENTIAL/PLATELET
Basophils Absolute: 0 10*3/uL (ref 0.0–0.1)
Basophils Relative: 0 %
EOS ABS: 0.1 10*3/uL (ref 0.0–0.7)
Eosinophils Relative: 1 %
HEMATOCRIT: 47.8 % (ref 39.0–52.0)
HEMOGLOBIN: 17 g/dL (ref 13.0–17.0)
LYMPHS ABS: 3.2 10*3/uL (ref 0.7–4.0)
LYMPHS PCT: 30 %
MCH: 34.8 pg — AB (ref 26.0–34.0)
MCHC: 35.6 g/dL (ref 30.0–36.0)
MCV: 97.8 fL (ref 78.0–100.0)
MONOS PCT: 9 %
Monocytes Absolute: 0.9 10*3/uL (ref 0.1–1.0)
NEUTROS ABS: 6.6 10*3/uL (ref 1.7–7.7)
NEUTROS PCT: 60 %
Platelets: 193 10*3/uL (ref 150–400)
RBC: 4.89 MIL/uL (ref 4.22–5.81)
RDW: 13.2 % (ref 11.5–15.5)
WBC: 10.8 10*3/uL — AB (ref 4.0–10.5)

## 2016-03-18 LAB — BASIC METABOLIC PANEL
ANION GAP: 13 (ref 5–15)
BUN: <5 mg/dL — ABNORMAL LOW (ref 6–20)
CO2: 20 mmol/L — ABNORMAL LOW (ref 22–32)
CREATININE: 0.6 mg/dL — AB (ref 0.61–1.24)
Calcium: 9.2 mg/dL (ref 8.9–10.3)
Chloride: 104 mmol/L (ref 101–111)
GFR calc non Af Amer: >60 mL/min (ref >60)
Glucose, Bld: 70 mg/dL (ref 65–99)
Potassium: 4.3 mmol/L (ref 3.5–5.1)
SODIUM: 137 mmol/L (ref 135–145)

## 2016-03-18 LAB — C-REACTIVE PROTEIN: CRP: <0.8 mg/dL (ref <1.0)

## 2016-03-18 LAB — SEDIMENTATION RATE: SED RATE: 3 mm/h (ref 0–16)

## 2016-03-18 MED ORDER — GADOBENATE DIMEGLUMINE 529 MG/ML IV SOLN
15.0000 mL | Freq: Once | INTRAVENOUS | Status: AC | PRN
  Administered 2016-03-18: 13 mL via INTRAVENOUS

## 2016-03-18 MED ORDER — IOPAMIDOL (ISOVUE-370) INJECTION 76%
INTRAVENOUS | Status: AC
  Administered 2016-03-18: 50 mL
  Filled 2016-03-18: qty 50

## 2016-03-18 NOTE — ED Provider Notes (Signed)
[facility] DEPT Provider Note   CSN: [id number] Arrival date & time: 03/18/16  1729     History   Chief Complaint Chief Complaint  Patient presents with  . Eye Pain    HPI Orien AAYAAN AL is a 61 y.o. male who presents with R eye ptosis. PMH significant for tobacco abuse and hx of seizures (not currently on meds). He started to have eye redness, swelling, and discharge from the R eye for the past couple weeks. He was seen by his PCP who diagnosed him with conjunctivitis and was given antibiotic drops and PO steroids. He was given Ophthalmology f/u. His symptoms have been constant. Nothing has made it better or worse. No diplopia. He went to Dr. Katy Fitch with Opthalmology today and exam was concerning for incomplete right third nerve palsy. He recommended to come to the ED for imaging and recommends MRI of brain and CTA of head/neck to r/o aneurysm.  HPI  Past Medical History:  Diagnosis Date  . Seizures ([place])     There are no active problems to display for this patient.   Past Surgical History:  Procedure Laterality Date  . VASECTOMY       Home Medications    Prior to Admission medications   Not on File    Family History Family History  Problem Relation Age of Onset  . Cancer Mother   . Diabetes Mother   . Hyperlipidemia Father     Social History Social History  Substance Use Topics  . Smoking status: Current Every Day Smoker  . Smokeless tobacco: Never Used  . Alcohol use Yes     Allergies   Penicillins   Review of Systems Review of Systems  Constitutional: Negative for chills and fever.  HENT: Negative for congestion, rhinorrhea and sore throat.           Eyes: Positive for discharge (mild, intermittent) and visual disturbance. Negative for photophobia, pain, redness and itching.       +Right eye lid drooping  Neurological: Positive for weakness (eye lid). Negative for numbness and headaches.  All other systems reviewed and are  negative.    Physical Exam Updated Vital Signs BP 134/97 (BP Location: Right Arm)   Pulse 86   Temp 98.3 F (36.8 C) (Oral)   Resp 18   Ht 5\' 6"  (1.676 m)   Wt 63.5 kg   SpO2 99%   BMI 22.60 kg/m   Physical Exam  Constitutional: He is oriented to person, place, and time. He appears well-developed and well-nourished. No distress.  HENT:  Head: Normocephalic and atraumatic.  Eyes: Conjunctivae are normal. Right eye exhibits no discharge. Left eye exhibits no discharge. Right conjunctiva is not injected. Left conjunctiva is not injected. No scleral icterus. Right eye exhibits normal extraocular motion. Left eye exhibits normal extraocular motion.  Obvious R sided ptosis. Left pupil is dilated, round, reactive. Right pupil is dilated, round, sluggish but reactive.   Neck: Normal range of motion.  Cardiovascular: Normal rate.   Pulmonary/Chest: Effort normal. No respiratory distress.  Abdominal: He exhibits no distension.  Neurological: He is alert and oriented to person, place, and time.  Mental Status:  Alert, oriented, thought content appropriate, able to give a coherent history. Speech fluent without evidence of aphasia. Able to follow 2 step commands without difficulty.  Cranial Nerves:  II:  See eye for details III,IV, VI: See eye for details V,VII: smile symmetric, facial light touch sensation equal VIII: hearing grossly normal to voice  X: uvula elevates symmetrically  XI: bilateral shoulder shrug symmetric and strong XII: midline tongue extension without fassiculations   Skin: Skin is warm and dry.  Psychiatric: He has a normal mood and affect. His behavior is normal.  Nursing note and vitals reviewed.    ED Treatments / Results  Labs (all labs ordered are listed, but only abnormal results are displayed) Labs Reviewed  CBC WITH DIFFERENTIAL/PLATELET - Abnormal; Notable for the following:       Result Value   WBC 10.8 (*)    MCH 34.8 (*)    All other components  within normal limits  BASIC METABOLIC PANEL - Abnormal; Notable for the following:    CO2 20 (*)    BUN <5 (*)    Creatinine, Ser 0.60 (*)    All other components within normal limits  SEDIMENTATION RATE  C-REACTIVE PROTEIN    EKG  EKG Interpretation None       Radiology Ct Angio Head W Or Wo Contrast  Result Date: 03/19/2016 CLINICAL DATA:  Initial evaluation for right-sided ptosis. EXAM: CT ANGIOGRAPHY HEAD AND NECK TECHNIQUE: Multidetector CT imaging of the head and neck was performed using the standard protocol during bolus administration of intravenous contrast. Multiplanar CT image reconstructions and MIPs were obtained to evaluate the vascular anatomy. Carotid stenosis measurements (when applicable) are obtained utilizing NASCET criteria, using the distal internal carotid diameter as the denominator. CONTRAST:  50 cc of Isovue 370. COMPARISON:  Comparison made with prior brain MRI performed earlier the same day. FINDINGS: CT HEAD FINDINGS Brain: Cerebral volume normal. No evidence for acute intracranial hemorrhage or infarct. No mass lesion, midline shift or mass effect. No hydrocephalus. No extra-axial fluid collection. Vascular: No hyperdense vessel. Vascular calcifications noted within the carotid siphons. Skull: Scalp soft tissues within normal limits.  Calvarium intact. Sinuses: Visualized globes and orbits within normal limits. Paranasal sinuses are clear. Mastoids grossly clear. CTA NECK FINDINGS Aortic arch: Visualized aortic arch of normal caliber with normal branch pattern. No high-grade stenosis at the origin of the great vessels. Visualized subclavian arteries are widely patent. Right carotid system: Right common carotid artery patent from its origin to the bifurcation. Scattered atheromatous plaque about the carotid bifurcation without flow-limiting stenosis. Right ICA widely patent from the bifurcation to the skullbase. No stenosis, dissection, or vascular occlusion within  the right carotid artery system. Right external carotid artery and its branches grossly unremarkable. Left carotid system: Left common carotid artery patent from its origin to the bifurcation. Scattered calcified plaque about the left bifurcation without flow-limiting stenosis. Left ICA patent distally without stenosis, dissection, or occlusion. Left external carotid artery and its branches grossly unremarkable. Vertebral arteries: Left vertebral artery arises separately from the aortic arch. Vertebral arteries are code dominant. Vertebral arteries widely patent within the neck without stenosis, dissection, or occlusion. Skeleton: Advanced degenerative spondylolysis throughout the cervical spine, most notable at C6-7. Multilevel facet arthropathy noted as well. No acute osseous abnormality. No worrisome lytic or blastic osseous lesions. Other neck: No acute soft tissue abnormality within the neck. No adenopathy. Upper chest: Visualized upper esophagus demonstrates wall thickening and is somewhat patulous in appearance. Upper mediastinum otherwise unremarkable. Visualized lungs are clear. Review of the MIP images confirms the above findings CTA HEAD FINDINGS Anterior circulation: Petrous segments widely patent bilaterally. Minimal plaque within the cavernous ICAs without stenosis. Supraclinoid segments widely patent. A1 segments patent. Anterior communicating artery normal. Anterior cerebral arteries widely patent to their distal aspects. M1 segments widely patent  without stenosis or occlusion. MCA bifurcations normal. Distal MCA branches well opacified and symmetric. Posterior circulation: Vertebral arteries widely patent to the vertebrobasilar junction. Posterior inferior cerebral arteries patent proximally. Basilar artery widely patent. Superior cerebral arteries patent bilaterally. Left PCA supplied via the basilar artery and is widely patent to its distal aspect. There is a fetal type right PCA supplied via a  widely patent right posterior communicating artery. Right PCA also patent to its distal aspect. Venous sinuses: Grossly patent. Anatomic variants: No significant anatomic variant. No aneurysm or vascular malformation. Delayed phase: No pathologic enhancement. Review of the MIP images confirms the above findings IMPRESSION: 1. Negative CTA of the head and neck. No acute vascular abnormality identified. No aneurysm. No high-grade stenosis. 2. Mild atheromatous plaque about the carotid bifurcations bilaterally without flow-limiting stenosis. 3. Patulous upper esophagus with associated circumferential wall thickening. Primary differential considerations include esophagitis, sequelae of reflux disease, or possibly achalasia. Clinical correlation recommended. Electronically Signed   By: Jeannine Boga M.D.   On: 03/19/2016 01:13   Ct Angio Neck W Or Wo Contrast  Result Date: 03/19/2016 CLINICAL DATA:  Initial evaluation for right-sided ptosis. EXAM: CT ANGIOGRAPHY HEAD AND NECK TECHNIQUE: Multidetector CT imaging of the head and neck was performed using the standard protocol during bolus administration of intravenous contrast. Multiplanar CT image reconstructions and MIPs were obtained to evaluate the vascular anatomy. Carotid stenosis measurements (when applicable) are obtained utilizing NASCET criteria, using the distal internal carotid diameter as the denominator. CONTRAST:  50 cc of Isovue 370. COMPARISON:  Comparison made with prior brain MRI performed earlier the same day. FINDINGS: CT HEAD FINDINGS Brain: Cerebral volume normal. No evidence for acute intracranial hemorrhage or infarct. No mass lesion, midline shift or mass effect. No hydrocephalus. No extra-axial fluid collection. Vascular: No hyperdense vessel. Vascular calcifications noted within the carotid siphons. Skull: Scalp soft tissues within normal limits.  Calvarium intact. Sinuses: Visualized globes and orbits within normal limits. Paranasal  sinuses are clear. Mastoids grossly clear. CTA NECK FINDINGS Aortic arch: Visualized aortic arch of normal caliber with normal branch pattern. No high-grade stenosis at the origin of the great vessels. Visualized subclavian arteries are widely patent. Right carotid system: Right common carotid artery patent from its origin to the bifurcation. Scattered atheromatous plaque about the carotid bifurcation without flow-limiting stenosis. Right ICA widely patent from the bifurcation to the skullbase. No stenosis, dissection, or vascular occlusion within the right carotid artery system. Right external carotid artery and its branches grossly unremarkable. Left carotid system: Left common carotid artery patent from its origin to the bifurcation. Scattered calcified plaque about the left bifurcation without flow-limiting stenosis. Left ICA patent distally without stenosis, dissection, or occlusion. Left external carotid artery and its branches grossly unremarkable. Vertebral arteries: Left vertebral artery arises separately from the aortic arch. Vertebral arteries are code dominant. Vertebral arteries widely patent within the neck without stenosis, dissection, or occlusion. Skeleton: Advanced degenerative spondylolysis throughout the cervical spine, most notable at C6-7. Multilevel facet arthropathy noted as well. No acute osseous abnormality. No worrisome lytic or blastic osseous lesions. Other neck: No acute soft tissue abnormality within the neck. No adenopathy. Upper chest: Visualized upper esophagus demonstrates wall thickening and is somewhat patulous in appearance. Upper mediastinum otherwise unremarkable. Visualized lungs are clear. Review of the MIP images confirms the above findings CTA HEAD FINDINGS Anterior circulation: Petrous segments widely patent bilaterally. Minimal plaque within the cavernous ICAs without stenosis. Supraclinoid segments widely patent. A1 segments patent. Anterior communicating artery  normal.  Anterior cerebral arteries widely patent to their distal aspects. M1 segments widely patent without stenosis or occlusion. MCA bifurcations normal. Distal MCA branches well opacified and symmetric. Posterior circulation: Vertebral arteries widely patent to the vertebrobasilar junction. Posterior inferior cerebral arteries patent proximally. Basilar artery widely patent. Superior cerebral arteries patent bilaterally. Left PCA supplied via the basilar artery and is widely patent to its distal aspect. There is a fetal type right PCA supplied via a widely patent right posterior communicating artery. Right PCA also patent to its distal aspect. Venous sinuses: Grossly patent. Anatomic variants: No significant anatomic variant. No aneurysm or vascular malformation. Delayed phase: No pathologic enhancement. Review of the MIP images confirms the above findings IMPRESSION: 1. Negative CTA of the head and neck. No acute vascular abnormality identified. No aneurysm. No high-grade stenosis. 2. Mild atheromatous plaque about the carotid bifurcations bilaterally without flow-limiting stenosis. 3. Patulous upper esophagus with associated circumferential wall thickening. Primary differential considerations include esophagitis, sequelae of reflux disease, or possibly achalasia. Clinical correlation recommended. Electronically Signed   By: Jeannine Boga M.D.   On: 03/19/2016 01:13   Mr Jeri Cos And Wo Contrast  Result Date: 03/19/2016 CLINICAL DATA:  Initial evaluation for right-sided ptosis. EXAM: MRI HEAD WITHOUT AND WITH CONTRAST TECHNIQUE: Multiplanar, multiecho pulse sequences of the brain and surrounding structures were obtained without and with intravenous contrast. CONTRAST:  50mL MULTIHANCE GADOBENATE DIMEGLUMINE 529 MG/ML IV SOLN COMPARISON:  None available. FINDINGS: Brain: Age related cerebral volume loss present. No significant cerebral white matter disease. No abnormal foci of restricted diffusion to suggest  acute or subacute ischemia. Gray-white matter differentiation well maintained. No evidence for acute or chronic intracranial hemorrhage. No areas of chronic infarction identified. The no mass lesion, midline shift, or mass effect. No hydrocephalus. No extra-axial fluid collection. Major dural sinuses are grossly patent. No abnormal enhancement. Pituitary gland and suprasellar region within normal limits. Vascular: Major intracranial vascular flow voids are maintained. Skull and upper cervical spine: Craniocervical junction normal. Degenerative spondylolysis noted within the upper cervical spine without significant stenosis. Bone marrow signal intensity within normal limits. No scalp soft tissue abnormality. Sinuses/Orbits: Globes and orbital soft tissues demonstrate no acute abnormality. Paranasal sinuses are clear. Small right mastoid effusion noted. Inner ear structures grossly normal. IMPRESSION: 1. Normal MRI of the brain. No acute intracranial process identified. 2. Small right mastoid effusion. Electronically Signed   By: Jeannine Boga M.D.   On: 03/19/2016 00:26    Procedures Procedures (including critical care time)  Medications Ordered in ED Medications  iopamidol (ISOVUE-370) 76 % injection (50 mLs  Contrast Given 03/18/16 2347)  gadobenate dimeglumine (MULTIHANCE) injection 15 mL (13 mLs Intravenous Contrast Given 03/18/16 2340)     Initial Impression / Assessment and Plan / ED Course  I have reviewed the triage vital signs and the nursing notes.  Pertinent labs & imaging results that were available during my care of the patient were reviewed by me and considered in my medical decision making (see chart for details).  61 year old male with symptoms concerning for third CN palsy. Imaging is negative. Spoke with Dr. Nicole Kindred who recommends outpatient f/u with neurology for possible EMG study. Discussed results with patient and wife. Outpatient neuro referral given. Opportunity for  questions provided and all questions answered. Return precautions given.   Final Clinical Impressions(s) / ED Diagnoses   Final diagnoses:  Ptosis    New Prescriptions New Prescriptions   No medications on file  Recardo Evangelist, PA-C 03/19/16 [id number]    Milton Ferguson, MD 03/20/16 1640

## 2016-03-18 NOTE — ED Triage Notes (Signed)
Pt sent to ED from eye doctor for possible MRI of eye.  Pt unable to open right eye and vision is blurry

## 2016-03-18 NOTE — ED Notes (Signed)
Pt taken to MRI  

## 2016-03-19 ENCOUNTER — Telehealth: Payer: Self-pay | Admitting: Physician Assistant

## 2016-03-19 NOTE — Telephone Encounter (Signed)
Dr. Katy Fitch contacted our office yesterday (03/19/16) for Edwin Torres who was referred to him by Dr. Mitchel Honour. Pt was in his office yesterday and was experiencing ptosis of R eye. Dr. Katy Fitch was wondering if he should send him to the ED for further evaluation or if our office wanted to order specific imaging. I informed him that the patient should go to the ED for further evaluation. Dr. Katy Fitch agreed and sent pt to the ED.

## 2016-03-19 NOTE — Discharge Instructions (Signed)
Please follow up with neurology to evaluate your symptoms further

## 2016-03-19 NOTE — ED Notes (Signed)
Patient Alert and oriented X4. Stable and ambulatory. Patient verbalized understanding of the discharge instructions.  Patient belongings were taken by the patient.  

## 2016-03-21 ENCOUNTER — Encounter: Payer: Self-pay | Admitting: Neurology

## 2016-03-21 ENCOUNTER — Other Ambulatory Visit (INDEPENDENT_AMBULATORY_CARE_PROVIDER_SITE_OTHER): Payer: PPO

## 2016-03-21 ENCOUNTER — Ambulatory Visit (INDEPENDENT_AMBULATORY_CARE_PROVIDER_SITE_OTHER): Payer: PPO | Admitting: Neurology

## 2016-03-21 VITALS — BP 136/82 | HR 89 | Ht 66.0 in | Wt 141.0 lb

## 2016-03-21 DIAGNOSIS — H4901 Third [oculomotor] nerve palsy, right eye: Secondary | ICD-10-CM

## 2016-03-21 LAB — HEMOGLOBIN A1C: Hgb A1c MFr Bld: 5.5 % (ref 4.6–6.5)

## 2016-03-21 NOTE — Patient Instructions (Signed)
1. Bloodwork for HBA1c, myasthenia panel 2. Consider smoking cessation 3. Monitor blood pressure 4. If symptoms change, go to ER immediately 5. Follow-up in 2 months, call for any changes

## 2016-03-21 NOTE — Progress Notes (Addendum)
NEUROLOGY CONSULTATION NOTE  Edwin Torres MRN: [id number] DOB: [birth year]  Referring provider: Dr. Milton Ferguson (ER) Primary care provider: none  Reason for consult:  ptosis  Dear Dr Roderic Palau:  Thank you for your kind referral of Edwin Torres for consultation of the above symptoms. Although his history is well known to you, please allow me to reiterate it for the purpose of our medical record. The patient was accompanied to the clinic by his wife who also provides collateral information. Records and images were personally reviewed where available.  HISTORY OF PRESENT ILLNESS: This is a pleasant 61 year old right-handed man with a remote history of seizures, not on any antiepileptic medication, no medical follow-up for at least 6 years, presenting for evaluation of third nerve palsy. They report that symptoms started 02/24/16, he may have woke up with it, but felt that his right eyelid was droopy. He denied having any pain, he was having tearing and eye discharge, denied any itching. They did not think his eye was red. He noted blurred vision, with vertical diplopia if he looked certain ways. He went for an acute visit with Primary Care on 03/10/16 reporting eye itching, congestion, discharge, redness, and mild eye pain, no pain with eye movements. He was noted to have eyelid edema and diagnosed with conjunctivitis and started on Tobramycin eye drops and Prednisone, with no improvement in symptoms. He was referred to [facility] and evaluated by ophthalmologist Dr. Katy Fitch on 03/18/16. Note reviewed, he was noted to have asymmetric pupils, 4-->3 OD, sluggish and 3-->2 OS somewhat brisk. There was mild weakness with right eye adduction, depression, and elevation, eye was noted to be slightly down and out. Left eye normal. He had a dilated exam with normal optic disc. He was diagnosed with incomplete third nerve palsy, pupil-involving, and was instructed to proceed to the ER where he had an MRI brain  with and without contrast and CTA head and neck. I discussed CTA head and neck with neuroradiologist Dr. Jeralyn Ruths, it is excellent quality with no note of aneurysm. MRI brain normal. ESR and CRP were normal. He presents today for continued symptoms, he feels dizzy because he is unable to see well. He denies any recent head trauma or infection. He has occasional headaches, unchanged from prior frequency. He denies any dysarthria/dysphagia, neck/back pain, focal numbness/tingling/weakness, bowel/bladder dysfunction.   PAST MEDICAL HISTORY: Past Medical History:  Diagnosis Date  . Seizures ([place])     PAST SURGICAL HISTORY: Past Surgical History:  Procedure Laterality Date  . VASECTOMY      MEDICATIONS: Current Outpatient Prescriptions on File Prior to Visit  Medication Sig Dispense Refill  . ibuprofen (ADVIL,MOTRIN) 200 MG tablet Take 600 mg by mouth every 6 (six) hours as needed for moderate pain.    Marland Kitchen tobramycin (TOBREX) 0.3 % ophthalmic ointment Place 2 application into the right eye every 6 (six) hours.     No current facility-administered medications on file prior to visit.     ALLERGIES: Allergies  Allergen Reactions  . Penicillins Anaphylaxis    FAMILY HISTORY: Family History  Problem Relation Age of Onset  . Cancer Mother   . Diabetes Mother   . Hyperlipidemia Father     SOCIAL HISTORY: Social History   Social History  . Marital status: Married    Spouse name: N/A  . Number of children: N/A  . Years of education: N/A   Occupational History  . Not on file.   Social History Main Topics  .  Smoking status: Current Every Day Smoker  . Smokeless tobacco: Never Used  . Alcohol use Yes  . Drug use: No  . Sexual activity: Not on file   Other Topics Concern  . Not on file   Social History Narrative  . No narrative on file    REVIEW OF SYSTEMS: Constitutional: No fevers, chills, or sweats, no generalized fatigue, change in appetite Eyes: as above Ear, nose and  throat: No hearing loss, ear pain, nasal congestion, sore throat Cardiovascular: No chest pain, palpitations Respiratory:  No shortness of breath at rest or with exertion, wheezes GastrointestinaI: No nausea, vomiting, diarrhea, abdominal pain, fecal incontinence Genitourinary:  No dysuria, urinary retention or frequency Musculoskeletal:  No neck pain, back pain Integumentary: No rash, pruritus, skin lesions Neurological: as above Psychiatric: No depression, insomnia, anxiety Endocrine: No palpitations, fatigue, diaphoresis, mood swings, change in appetite, change in weight, increased thirst Hematologic/Lymphatic:  No anemia, purpura, petechiae. Allergic/Immunologic: no itchy/runny eyes, nasal congestion, recent allergic reactions, rashes  PHYSICAL EXAM: Vitals:   03/21/16 1018  BP: 136/82  Pulse: 89   General: No acute distress, right ptosis Head:  Normocephalic/atraumatic Eyes: Fundoscopic exam shows bilateral sharp discs, no vessel changes, exudates, or hemorrhages Neck: supple, no paraspinal tenderness, full range of motion Back: No paraspinal tenderness Heart: regular rate and rhythm Lungs: Clear to auscultation bilaterally. Vascular: No carotid bruits. Skin/Extremities: No rash, no edema Neurological Exam: Mental status: alert and oriented to person, place, and time, no dysarthria or aphasia, Fund of knowledge is appropriate.  Recent and remote memory are intact.  Attention and concentration are normal.    Able to name objects and repeat phrases. Cranial nerves: CN I: not tested CN II: pupils equal, round. Left pupil is sluggish but reactive to light. Right pupil is unreactive, tested in dark room. Visual fields intact, fundi unremarkable. CN III, IV, VI:  full range of motion, no nystagmus, +right ptosis CN V: facial sensation intact CN VII: upper and lower face symmetric CN VIII: hearing intact to finger rub CN IX, X: gag intact, uvula midline CN XI: sternocleidomastoid  and trapezius muscles intact CN XII: tongue midline Bulk & Tone: normal, no fasciculations. Motor: 5/5 throughout with no pronator drift. Sensation: intact to light touch, cold, pin, vibration and joint position sense.  No extinction to double simultaneous stimulation.  Romberg test negative Deep Tendon Reflexes: +2 throughout, no ankle clonus Plantar responses: downgoing bilaterally Cerebellar: no incoordination on finger to nose, heel to shin. No dysdiadochokinesia Gait: narrow-based and steady, able to tandem walk adequately. Tremor: none  IMPRESSION: This is a pleasant 61 year old right-handed man with a remote history of seizures, not on any seizure medications, presenting for third nerve palsy that started on 02/24/16. He was evaluated by ophthalmologist Dr. Katy Fitch 3 days ago with note of asymmetric pupils and sluggish reaction on right, ptosis, and gaze palsy with upgaze/downgaze/adduction, concerning for non-pupil sparing third nerve palsy. He had an MRI brain with and without contrast with no acute infarct seen, CTA head and neck were reviewed with radiology today, noted to be excellent quality with no aneurysm seen. It was felt that a cerebral angiogram would be low yield. Findings were discussed with the patient and his wife, certainly the pupil-involvement is concerning for a compressive lesion, none seen on imaging studies. There may be some improvement as range of motion appears normal today compared to 3 days ago with some reduced upgaze/downgaze and adduction with Dr. Katy Fitch. The patient has not had  any medical follow-up in more than 5 years, diabetic third nerve palsy is considered, less likely myasthenia due to pupil involvement. Check HbA1c and myasthenia panel. ESR and CRP were normal. Smoking cessation and BP monitoring was advised. We have agreed to do close clinical monitoring of symptoms for now, he knows to go to the ER for any change in symptoms and will follow-up in 2  months.  Thank you for allowing me to participate in the care of this patient. Please do not hesitate to call for any questions or concerns.   Ellouise Newer, M.D.  CC: Dr. Katy Fitch, Dr. Mitchel Honour

## 2016-03-25 LAB — MYASTHENIA GRAVIS PANEL 2: Anti-striation Abs: NEGATIVE

## 2016-03-28 ENCOUNTER — Telehealth: Payer: Self-pay | Admitting: Neurology

## 2016-03-28 NOTE — Telephone Encounter (Signed)
PT called and wanted to know if the blood work results were in yet/Dawn CB#[phone]

## 2016-03-31 NOTE — Telephone Encounter (Signed)
Patient notified

## 2016-03-31 NOTE — Telephone Encounter (Signed)
-----   Message from Cameron Sprang, MD sent at 03/27/2016  3:55 PM EST ----- Pls let him know bloodwork is normal, thanks

## 2016-05-14 ENCOUNTER — Encounter: Payer: Self-pay | Admitting: Neurology

## 2016-05-14 ENCOUNTER — Ambulatory Visit (INDEPENDENT_AMBULATORY_CARE_PROVIDER_SITE_OTHER): Payer: PPO | Admitting: Neurology

## 2016-05-14 VITALS — BP 130/80 | HR 81 | Temp 98.1°F | Resp 16 | Ht 63.0 in | Wt 138.7 lb

## 2016-05-14 DIAGNOSIS — H4901 Third [oculomotor] nerve palsy, right eye: Secondary | ICD-10-CM

## 2016-05-14 NOTE — Patient Instructions (Addendum)
Looking good! Continue to monitor blood sugar and blood pressure with your PCP. Follow-up in 6 months. If symptoms change, call our office.

## 2016-05-14 NOTE — Progress Notes (Signed)
NEUROLOGY FOLLOW UP OFFICE NOTE  Edwin Torres [id number]  HISTORY OF PRESENT ILLNESS: I had the pleasure of seeing Edwin Torres in follow-up in the neurology clinic on 05/14/2016.  The patient was last seen 2 months ago for third nerve palsy that started at the end of December. He had seen ophthalmologist Dr. Katy Fitch who noted asymmetric pupils, mild weakness of right eye adduction, depression, and elevation, and ptosis, consistent with incomplete third nerve palsy, pupil-involving. He was sent urgently to the ER where he had an MRI brain with and without contrast and a CTA head and neck which were normal, no evidence of infarct, mass, or aneurysm. ESR and CRP normal. Myasthenia panel negative, HbA1c was 5.5. We agreed to do close clinical monitoring, he comes today and reports an improvement in the ptosis, he is able to open his eyelid better the past 2 weeks. His vision is still a little blurred but a lot better, no diplopia. He denies any headaches, dizziness, focal numbness/tingling/weakness. No falls. He drinks 4-5 beers daily.   HPI 03/21/2016: This is a pleasant 61 yo RH man with a remote history of seizures, not on any antiepileptic medication, no medical follow-up for at least 6 years, presenting for evaluation of third nerve palsy. They report that symptoms started 02/24/16, he may have woke up with it, but felt that his right eyelid was droopy. He denied having any pain, he was having tearing and eye discharge, denied any itching. They did not think his eye was red. He noted blurred vision, with vertical diplopia if he looked certain ways. He went for an acute visit with Primary Care on 03/10/16 reporting eye itching, congestion, discharge, redness, and mild eye pain, no pain with eye movements. He was noted to have eyelid edema and diagnosed with conjunctivitis and started on Tobramycin eye drops and Prednisone, with no improvement in symptoms. He was [facility] to [facility] and evaluated by  ophthalmologist Dr. Katy Fitch on 03/18/16. Note reviewed, he was noted to have asymmetric pupils, 4-->3 OD, sluggish and 3-->2 OS somewhat brisk. There was mild weakness with right eye adduction, depression, and elevation, eye was noted to be slightly down and out. Left eye normal. He had a dilated exam with normal optic disc. He was diagnosed with incomplete third nerve palsy, pupil-involving, and was instructed to proceed to the ER where he had an MRI brain with and without contrast and CTA head and neck. I discussed CTA head and neck with neuroradiologist Dr. Jeralyn Ruths, it is excellent quality with no note of aneurysm. MRI brain normal. ESR and CRP were normal. He presents today for continued symptoms, he feels dizzy because he is unable to see well. He denies any recent head trauma or infection. He has occasional headaches, unchanged from prior frequency. He denies any dysarthria/dysphagia, neck/back pain, focal numbness/tingling/weakness, bowel/bladder dysfunction.   PAST MEDICAL HISTORY: Past Medical History:  Diagnosis Date  . Seizures ([place])     MEDICATIONS: Current Outpatient Prescriptions on File Prior to Visit  Medication Sig Dispense Refill  . ibuprofen (ADVIL,MOTRIN) 200 MG tablet Take 600 mg by mouth every 6 (six) hours as needed for moderate pain.     No current facility-administered medications on file prior to visit.     ALLERGIES: Allergies  Allergen Reactions  . Penicillins Anaphylaxis    FAMILY HISTORY: Family History  Problem Relation Age of Onset  . Cancer Mother   . Diabetes Mother   . Hyperlipidemia Father     SOCIAL HISTORY:  Social History   Social History  . Marital status: Married    Spouse name: N/A  . Number of children: N/A  . Years of education: N/A   Occupational History  . Not on file.   Social History Main Topics  . Smoking status: Current Every Day Smoker  . Smokeless tobacco: Current User  . Alcohol use 4.8 oz/week    8 Cans of beer per week      Comment: daily  . Drug use: No  . Sexual activity: Yes    Partners: Female    Birth control/ protection: None   Other Topics Concern  . Not on file   Social History Narrative  . No narrative on file    REVIEW OF SYSTEMS: Constitutional: No fevers, chills, or sweats, no generalized fatigue, change in appetite Eyes: No visual changes, double vision, eye pain Ear, nose and throat: No hearing loss, ear pain, nasal congestion, sore throat Cardiovascular: No chest pain, palpitations Respiratory:  No shortness of breath at rest or with exertion, wheezes GastrointestinaI: No nausea, vomiting, diarrhea, abdominal pain, fecal incontinence Genitourinary:  No dysuria, urinary retention or frequency Musculoskeletal:  No neck pain, back pain Integumentary: No rash, pruritus, skin lesions Neurological: as above Psychiatric: No depression, insomnia, anxiety Endocrine: No palpitations, fatigue, diaphoresis, mood swings, change in appetite, change in weight, increased thirst Hematologic/Lymphatic:  No anemia, purpura, petechiae. Allergic/Immunologic: no itchy/runny eyes, nasal congestion, recent allergic reactions, rashes  PHYSICAL EXAM: Vitals:   05/14/16 1338  BP: 130/80  Pulse: 81  Resp: 16  Temp: 98.1 F (36.7 C)   General: No acute distress, right ptosis still present but much improved from last visit Head:  Normocephalic/atraumatic Neck: supple, no paraspinal tenderness, full range of motion Back: No paraspinal tenderness Heart: regular rate and rhythm Lungs: Clear to auscultation bilaterally. Vascular: No carotid bruits. Skin/Extremities: No rash, no edema Neurological Exam: Mental status: alert and oriented to person, place, and time, no dysarthria or aphasia, Fund of knowledge is appropriate.  Recent and remote memory are intact.  Attention and concentration are normal.    Able to name objects and repeat phrases. Cranial nerves: CN I: not tested CN II: pupils equal, round  reactive bilaterally. Visual fields intact CN III, IV, VI:  full range of motion, no nystagmus, improved ptosis CN V: facial sensation intact CN VII: upper and lower face symmetric CN VIII: hearing intact to finger rub CN IX, X: gag intact, uvula midline CN XI: sternocleidomastoid and trapezius muscles intact CN XII: tongue midline Bulk & Tone: normal, no fasciculations. Motor: 5/5 throughout with no pronator drift. Sensation: intact to light touch.  No extinction to double simultaneous stimulation.  Romberg test negative Deep Tendon Reflexes: +2 throughout, no ankle clonus Plantar responses: downgoing bilaterally Cerebellar: no incoordination on finger to nose, heel to shin. No dysdiadochokinesia Gait: narrow-based and steady, able to tandem walk adequately. Tremor: none  IMPRESSION: This is a pleasant 61 yo RH man with a remote history of seizures, not on any seizure medications, who presented with an incomplete third nerve palsy, pupil-involving that occurred at the end of December. MRI brain with and without contrast no acute infarct seen, CTA head and neck normal, no evidence of aneurysm. Concern with pupil-involvement was for a compressive lesion, we agreed on close clinical monitoring as radiology felt angiogram would be low yield. He presents today with an improvement in symptoms, ptosis is better, his vision is still blurred but better as well, no diplopia. Bloodwork normal. Etiology  of third nerve palsy unclear, diabetic third nerve palsy is considered, however his HbA1c is normal. It is likely still ischemic, continue to monitor vascular risk factors. Smoking cessation and BP monitoring was advised. He was advised to set up PCP care. We will continue close clinical monitoring of symptoms for now, he knows to go to the ER for any change in symptoms and will follow-up in 6 months.  Thank you for allowing me to participate in his care.  Please do not hesitate to call for any questions or  concerns.  The duration of this appointment visit was 15 minutes of face-to-face time with the patient.  Greater than 50% of this time was spent in counseling, explanation of diagnosis, planning of further management, and coordination of care.   Ellouise Newer, M.D.

## 2016-07-11 ENCOUNTER — Emergency Department (HOSPITAL_COMMUNITY)
Admission: EM | Admit: 2016-07-11 | Discharge: 2016-07-11 | Disposition: A | Discharge status: Home / Self Care | Payer: PPO | Attending: Emergency Medicine | Admitting: Emergency Medicine

## 2016-07-11 ENCOUNTER — Emergency Department (HOSPITAL_COMMUNITY): Payer: PPO

## 2016-07-11 ENCOUNTER — Encounter (HOSPITAL_COMMUNITY): Payer: Self-pay

## 2016-07-11 DIAGNOSIS — Y939 Activity, unspecified: Secondary | ICD-10-CM | POA: Diagnosis not present

## 2016-07-11 DIAGNOSIS — Y929 Unspecified place or not applicable: Secondary | ICD-10-CM | POA: Insufficient documentation

## 2016-07-11 DIAGNOSIS — S0993XA Unspecified injury of face, initial encounter: Secondary | ICD-10-CM | POA: Diagnosis not present

## 2016-07-11 DIAGNOSIS — S0181XA Laceration without foreign body of other part of head, initial encounter: Secondary | ICD-10-CM

## 2016-07-11 DIAGNOSIS — R930 Abnormal findings on diagnostic imaging of skull and head, not elsewhere classified: Secondary | ICD-10-CM | POA: Insufficient documentation

## 2016-07-11 DIAGNOSIS — S098XXA Other specified injuries of head, initial encounter: Secondary | ICD-10-CM | POA: Diagnosis not present

## 2016-07-11 DIAGNOSIS — W19XXXA Unspecified fall, initial encounter: Secondary | ICD-10-CM

## 2016-07-11 DIAGNOSIS — Z23 Encounter for immunization: Secondary | ICD-10-CM | POA: Insufficient documentation

## 2016-07-11 DIAGNOSIS — F17200 Nicotine dependence, unspecified, uncomplicated: Secondary | ICD-10-CM | POA: Diagnosis not present

## 2016-07-11 DIAGNOSIS — Y900 Blood alcohol level of less than 20 mg/100 ml: Secondary | ICD-10-CM | POA: Diagnosis not present

## 2016-07-11 DIAGNOSIS — Y999 Unspecified external cause status: Secondary | ICD-10-CM | POA: Diagnosis not present

## 2016-07-11 DIAGNOSIS — W25XXXA Contact with sharp glass, initial encounter: Secondary | ICD-10-CM | POA: Diagnosis not present

## 2016-07-11 DIAGNOSIS — F10120 Alcohol abuse with intoxication, uncomplicated: Secondary | ICD-10-CM | POA: Insufficient documentation

## 2016-07-11 DIAGNOSIS — F10920 Alcohol use, unspecified with intoxication, uncomplicated: Secondary | ICD-10-CM

## 2016-07-11 DIAGNOSIS — S01412A Laceration without foreign body of left cheek and temporomandibular area, initial encounter: Secondary | ICD-10-CM | POA: Diagnosis not present

## 2016-07-11 DIAGNOSIS — S199XXA Unspecified injury of neck, initial encounter: Secondary | ICD-10-CM | POA: Diagnosis not present

## 2016-07-11 DIAGNOSIS — F10220 Alcohol dependence with intoxication, uncomplicated: Secondary | ICD-10-CM | POA: Diagnosis not present

## 2016-07-11 DIAGNOSIS — S0590XA Unspecified injury of unspecified eye and orbit, initial encounter: Secondary | ICD-10-CM | POA: Diagnosis not present

## 2016-07-11 DIAGNOSIS — T148XXA Other injury of unspecified body region, initial encounter: Secondary | ICD-10-CM

## 2016-07-11 HISTORY — DX: Alcohol dependence, uncomplicated: F10.20

## 2016-07-11 MED ORDER — TETANUS-DIPHTH-ACELL PERTUSSIS 5-2.5-18.5 LF-MCG/0.5 IM SUSP
0.5000 mL | Freq: Once | INTRAMUSCULAR | Status: AC
  Administered 2016-07-11: 0.5 mL via INTRAMUSCULAR
  Filled 2016-07-11: qty 0.5

## 2016-07-11 MED ORDER — MUPIROCIN CALCIUM 2 % EX CREA: 1.0000 "application " | TOPICAL_CREAM | Freq: Two times a day (BID) | CUTANEOUS | 0 refills | Status: DC

## 2016-07-11 NOTE — ED Provider Notes (Signed)
[facility] DEPT Provider Note   CSN: [id number] Arrival date & time: 07/11/16  0251  By signing my name below, I, Margit Banda, attest that this documentation has been prepared under the direction and in the presence of [place], Quenisha Lovins, MD. Electronically Signed: Margit Banda, ED Scribe. 07/11/16. 3:30 AM.  History   Chief Complaint Chief Complaint  Patient presents with  . Alcohol Intoxication  . lacerations    HPI Edwin Torres is a 61 y.o. male who presents to the Emergency Department complaining of a wound under his left eye that occurred PTA. Pt reports he had been drinking alcohol when he tripped and fell earlier in the evening. He also notes that most of his wounds are from a previous fall that occurred a couple of days ago. His last drink was ~ 9 pm on 07/10/16. Pt denies LOC.  The history is provided by the patient. No language interpreter was used.  Alcohol Intoxication  This is a chronic problem. The current episode started more than 1 week ago. The problem occurs constantly. The problem has not changed since onset.Pertinent negatives include no chest pain, no abdominal pain, no headaches and no shortness of breath. Nothing relieves the symptoms. He has tried nothing for the symptoms. The treatment provided no relief.    Past Medical History:  Diagnosis Date  . Alcoholic ([place])   . Seizures [facility])     Patient Active Problem List   Diagnosis Date Noted  . Weakness of right third cranial nerve 03/21/2016    Past Surgical History:  Procedure Laterality Date  . VASECTOMY         Home Medications    Prior to Admission medications   Medication Sig Start Date End Date Taking? Authorizing Provider  ibuprofen (ADVIL,MOTRIN) 200 MG tablet Take 600 mg by mouth every 6 (six) hours as needed for moderate pain.    [provider]    Family History Family History  Problem Relation Age of Onset  . Cancer Mother   . Diabetes Mother   . Hyperlipidemia  Father     Social History Social History  Substance Use Topics  . Smoking status: Current Every Day Smoker  . Smokeless tobacco: Current User  . Alcohol use 4.8 oz/week    8 Cans of beer per week     Comment: daily     Allergies   Penicillins   Review of Systems Review of Systems  Respiratory: Negative for shortness of breath.   Cardiovascular: Negative for chest pain.  Gastrointestinal: Negative for abdominal pain.  Skin: Positive for wound.  Neurological: Negative for syncope and headaches.  Psychiatric/Behavioral: Negative for self-injury, sleep disturbance and suicidal ideas. The patient is not nervous/anxious.   All other systems reviewed and are negative.    Physical Exam Updated Vital Signs BP 133/86 (BP Location: Right Arm)   Pulse 91   Temp 97.8 F (36.6 C) (Oral)   Resp 18   Ht 5\' 6"  (1.676 m)   Wt 145 lb (65.8 kg)   SpO2 98%   BMI 23.40 kg/m   Physical Exam  Constitutional: He is oriented to person, place, and time. He appears well-developed and well-nourished.  HENT:  Head: Normocephalic and atraumatic. Head is without raccoon's eyes and without Battle's sign.    Right Ear: External ear normal.  Left Ear: External ear normal.  Mouth/Throat: Oropharynx is clear and moist. No oropharyngeal exudate.  Eyes: Conjunctivae and EOM are normal. Pupils are equal, round, and reactive to  light. Right eye exhibits no discharge. Left eye exhibits no discharge. No scleral icterus.  Neck: Normal range of motion. Neck supple. No JVD present. No tracheal deviation present.  Trachea is midline. No stridor or carotid bruits. C-spine normal.  Cardiovascular: Normal rate, regular rhythm, normal heart sounds and intact distal pulses.   No murmur heard. Pulmonary/Chest: Effort normal and breath sounds normal. No stridor. No respiratory distress. He has no wheezes. He has no rales.  Lungs CTA bilaterally.  Abdominal: Soft. Bowel sounds are normal. He exhibits no  distension and no mass. There is no tenderness. There is no rebound and no guarding.  Musculoskeletal: Normal range of motion. He exhibits no edema or tenderness.  All compartments are soft. No palpable cords.   Lymphadenopathy:    He has no cervical adenopathy.  Neurological: He is alert and oriented to person, place, and time. He has normal reflexes. He displays normal reflexes.  Skin: Skin is warm and dry. Capillary refill takes less than 2 seconds.  bruises and abrasions. Bruise on face is a couple of days old.  Psychiatric: He has a normal mood and affect. His behavior is normal. He is not agitated. Thought content is not delusional. He does not express impulsivity. He expresses no suicidal plans and no homicidal plans.  Nursing note and vitals reviewed.    ED Treatments / Results  DIAGNOSTIC STUDIES: Oxygen Saturation is 98% on RA, normal by my interpretation.   COORDINATION OF CARE: 3:25 AM-Discussed next steps with pt. Pt verbalized understanding and is agreeable with the plan.     Radiology Results for orders placed or performed in visit on 03/21/16  Myasthenia gravis panel 2  Result Value Ref Range   AChR Binding Ab, Serum <0.03 0.00 - 0.24 nmol/L   Anti-striation Abs Negative Neg:<1:40  Hemoglobin A1c  Result Value Ref Range   Hgb A1c MFr Bld 5.5 4.6 - 6.5 %   Ct Head Wo Contrast  Result Date: 07/11/2016 CLINICAL DATA:  Fall tonight, wound under left eye, no loss of consciousness. EXAM: CT HEAD WITHOUT CONTRAST CT CERVICAL SPINE WITHOUT CONTRAST TECHNIQUE: Multidetector CT imaging of the head and cervical spine was performed following the standard protocol without intravenous contrast. Multiplanar CT image reconstructions of the cervical spine were also generated. COMPARISON:  Head CT 03/18/2016 FINDINGS: CT HEAD FINDINGS Brain: No evidence of acute infarction, hemorrhage, hydrocephalus, extra-axial collection or mass lesion/mass effect. Vascular: Atherosclerosis of  skullbase vasculature without hyperdense vessel or abnormal calcification. Skull: No fracture or focal abnormality. Sinuses/Orbits: Opacification of lower right mastoid air cells, new from prior exam. Left mastoid air cells are clear. Paranasal sinuses are well-aerated. Left periorbital soft tissue edema is partially included. Other: None. CT CERVICAL SPINE FINDINGS Alignment: Straightening of normal lordosis. Minimal anterolisthesis of C7 on T1 is unchanged from prior neck CTA. No jumped or perched facets. Lateral masses of C1 are well aligned on C2. Skull base and vertebrae: Chronic minimal loss of height of C6 vertebra. No acute fracture. Probable remote fracture of left lamina of T1 and remote left transverse process fracture. This is unchanged from prior exam with sclerotic margins. The dens and skull base are intact. Soft tissues and spinal canal: No prevertebral fluid or swelling. No visible canal hematoma. Disc levels: Advanced disc space narrowing and endplate spurring throughout the entire cervical spine. This is most severe at C6-C7. There is multilevel neural foraminal narrowing. Mild central canal narrowing at C3-C4. Upper chest: No acute or traumatic abnormality. Other: Carotid  atherosclerosis. IMPRESSION: 1. No acute intracranial abnormality. Left periorbital soft tissue edema is partially included. 2. Advanced degenerative change in the cervical spine without acute fracture or subluxation. Electronically Signed   By: Jeb Levering M.D.   On: 07/11/2016 05:31   Ct Cervical Spine Wo Contrast  Result Date: 07/11/2016 CLINICAL DATA:  Fall tonight, wound under left eye, no loss of consciousness. EXAM: CT HEAD WITHOUT CONTRAST CT CERVICAL SPINE WITHOUT CONTRAST TECHNIQUE: Multidetector CT imaging of the head and cervical spine was performed following the standard protocol without intravenous contrast. Multiplanar CT image reconstructions of the cervical spine were also generated. COMPARISON:  Head  CT 03/18/2016 FINDINGS: CT HEAD FINDINGS Brain: No evidence of acute infarction, hemorrhage, hydrocephalus, extra-axial collection or mass lesion/mass effect. Vascular: Atherosclerosis of skullbase vasculature without hyperdense vessel or abnormal calcification. Skull: No fracture or focal abnormality. Sinuses/Orbits: Opacification of lower right mastoid air cells, new from prior exam. Left mastoid air cells are clear. Paranasal sinuses are well-aerated. Left periorbital soft tissue edema is partially included. Other: None. CT CERVICAL SPINE FINDINGS Alignment: Straightening of normal lordosis. Minimal anterolisthesis of C7 on T1 is unchanged from prior neck CTA. No jumped or perched facets. Lateral masses of C1 are well aligned on C2. Skull base and vertebrae: Chronic minimal loss of height of C6 vertebra. No acute fracture. Probable remote fracture of left lamina of T1 and remote left transverse process fracture. This is unchanged from prior exam with sclerotic margins. The dens and skull base are intact. Soft tissues and spinal canal: No prevertebral fluid or swelling. No visible canal hematoma. Disc levels: Advanced disc space narrowing and endplate spurring throughout the entire cervical spine. This is most severe at C6-C7. There is multilevel neural foraminal narrowing. Mild central canal narrowing at C3-C4. Upper chest: No acute or traumatic abnormality. Other: Carotid atherosclerosis. IMPRESSION: 1. No acute intracranial abnormality. Left periorbital soft tissue edema is partially included. 2. Advanced degenerative change in the cervical spine without acute fracture or subluxation. Electronically Signed   By: Jeb Levering M.D.   On: 07/11/2016 05:31    Procedures .Marland KitchenLaceration Repair Date/Time: 07/11/2016 6:27 AM Performed by: Veatrice Kells Authorized by: Veatrice Kells   Consent:    Consent obtained:  Verbal   Consent given by:  Patient   Risks discussed:  Infection, pain and poor cosmetic  result   Alternatives discussed:  No treatment Anesthesia (see MAR for exact dosages):    Anesthesia method:  None Laceration details:    Location:  Face   Face location:  L cheek   Length (cm):  1   Depth (mm):  1 Repair type:    Repair type:  Simple Exploration:    Hemostasis achieved with:  Direct pressure   Wound exploration: wound explored through full range of motion     Wound extent: no areolar tissue violation noted     Contaminated: no   Treatment:    Area cleansed with:  Betadine   Amount of cleaning:  Standard   Irrigation solution:  Sterile saline Skin repair:    Repair method:  Tissue adhesive Approximation:    Approximation:  Close   Vermilion border: well-aligned   Post-procedure details:    Dressing:  Open (no dressing)   Patient tolerance of procedure:  Tolerated well, no immediate complications   (including critical care time)  Medications Ordered in ED Medications  Tdap (BOOSTRIX) injection 0.5 mL (not administered)     Final Clinical Impressions(s) / ED Diagnoses  Alcohol abuse and intoxication:   The patient is nontoxic-appearing on exam and vital signs are within normal limits.  Return for persistent fever, vomiting, weakness, lethargy, leg swelling or any concerns. If you are feeling depressed or suicidal return immediately.    I have reviewed the triage vital signs and the nursing notes. Pertinent labs &imaging results that were available during my care of the patient were reviewed by me and considered in my medical decision making (see chart for details). The patient is nontoxic-appearing on exam and vital signs are within normal limits. Return for fevers, chest pain with exertion, weakness, numbness, neck pain or stiffness, inability to make or understand speech or any concerns.   After history, exam, and medical workup I feel the patient has been appropriately medically screened and is safe for discharge home. Pertinent diagnoses were  discussed with the patient. Patient was given return precautions.   I personally performed the services described in this documentation, which was scribed in my presence. The recorded information has been reviewed and is accurate.       Taevon Aschoff, MD 07/11/16 [phone]

## 2016-07-11 NOTE — ED Notes (Signed)
Pt is intoxicated and fell tonight into glass and has multiple lacerations on his face and arms

## 2016-08-11 ENCOUNTER — Encounter (HOSPITAL_COMMUNITY): Payer: Self-pay | Admitting: *Deleted

## 2016-08-11 ENCOUNTER — Emergency Department (HOSPITAL_COMMUNITY)
Admission: EM | Admit: 2016-08-11 | Discharge: 2016-08-11 | Disposition: A | Discharge status: Home / Self Care | Payer: PPO | Attending: Emergency Medicine | Admitting: Emergency Medicine

## 2016-08-11 DIAGNOSIS — F10929 Alcohol use, unspecified with intoxication, unspecified: Secondary | ICD-10-CM | POA: Diagnosis not present

## 2016-08-11 DIAGNOSIS — Y9389 Activity, other specified: Secondary | ICD-10-CM | POA: Insufficient documentation

## 2016-08-11 DIAGNOSIS — F10129 Alcohol abuse with intoxication, unspecified: Secondary | ICD-10-CM | POA: Diagnosis not present

## 2016-08-11 DIAGNOSIS — T148XXA Other injury of unspecified body region, initial encounter: Secondary | ICD-10-CM

## 2016-08-11 DIAGNOSIS — Y92009 Unspecified place in unspecified non-institutional (private) residence as the place of occurrence of the external cause: Secondary | ICD-10-CM | POA: Diagnosis not present

## 2016-08-11 DIAGNOSIS — S51811A Laceration without foreign body of right forearm, initial encounter: Secondary | ICD-10-CM | POA: Insufficient documentation

## 2016-08-11 DIAGNOSIS — S0181XA Laceration without foreign body of other part of head, initial encounter: Secondary | ICD-10-CM | POA: Diagnosis not present

## 2016-08-11 DIAGNOSIS — W0110XA Fall on same level from slipping, tripping and stumbling with subsequent striking against unspecified object, initial encounter: Secondary | ICD-10-CM | POA: Diagnosis not present

## 2016-08-11 DIAGNOSIS — S51812A Laceration without foreign body of left forearm, initial encounter: Secondary | ICD-10-CM | POA: Diagnosis not present

## 2016-08-11 DIAGNOSIS — Y999 Unspecified external cause status: Secondary | ICD-10-CM | POA: Insufficient documentation

## 2016-08-11 DIAGNOSIS — W19XXXA Unspecified fall, initial encounter: Secondary | ICD-10-CM

## 2016-08-11 MED ORDER — LIDOCAINE-EPINEPHRINE (PF) 2 %-1:200000 IJ SOLN
10.0000 mL | Freq: Once | INTRAMUSCULAR | Status: AC
  Administered 2016-08-11: 10 mL via INTRADERMAL
  Filled 2016-08-11: qty 20

## 2016-08-11 NOTE — Discharge Instructions (Signed)
Please monitor your condition carefully, and do not hesitate to return here for concerning changes in your condition.    Otherwise, keep your wounds clean, dry, with a light coating of antibiotic ointment.

## 2016-08-11 NOTE — ED Provider Notes (Signed)
[facility] DEPT Provider Note   CSN: [id number] Arrival date & time: 08/11/16  0093     History   Chief Complaint Chief Complaint  Patient presents with  . Fall    HPI [place] ABRIEL GEESEY is a 61 y.o. male.  HPI  Patient presents after mechanical fall. Patient recalls entirety of the fall which occurred just prior to ED arrival. She does acknowledge a history of drinking alcohol, but has had no alcohol since yesterday. Patient states that he was walking on concrete steps, slipped, fell striking his head, both forearms. No loss of consciousness, no subscore head pain, neck pain, confusion, disorientation, weakness in his extremities. Since the event he has had minimal pain, but has had ongoing bleeding from a left forehead laceration. He also has bleeding from both dorsal forearms, where he has substantial skin tears.   Past Medical History:  Diagnosis Date  . Alcoholic ([place])   . Seizures [facility])     Patient Active Problem List   Diagnosis Date Noted  . Weakness of right third cranial nerve 03/21/2016    Past Surgical History:  Procedure Laterality Date  . VASECTOMY         Home Medications    Prior to Admission medications   Medication Sig Start Date End Date Taking? Authorizing Provider  ibuprofen (ADVIL,MOTRIN) 200 MG tablet Take 600 mg by mouth every 6 (six) hours as needed for moderate pain.    [provider]  mupirocin cream (BACTROBAN) 2 % Apply 1 application topically 2 (two) times daily. 07/11/16   Palumbo, April, MD    Family History Family History  Problem Relation Age of Onset  . Cancer Mother   . Diabetes Mother   . Hyperlipidemia Father     Social History Social History  Substance Use Topics  . Smoking status: Current Every Day Smoker  . Smokeless tobacco: Current User  . Alcohol use 4.8 oz/week    8 Cans of beer per week     Comment: daily     Allergies   Penicillins   Review of Systems Review of Systems    Constitutional:       Per HPI, otherwise negative  HENT:       Per HPI, otherwise negative  Respiratory:       Per HPI, otherwise negative  Cardiovascular:       Per HPI, otherwise negative  Gastrointestinal: Negative for vomiting.  Endocrine:       Negative aside from HPI  Genitourinary:       Neg aside from HPI   Musculoskeletal:       Per HPI, otherwise negative  Skin: Positive for color change and wound.  Neurological: Negative for syncope, weakness and headaches.  Hematological: Negative.      Physical Exam Updated Vital Signs BP 100/69 (BP Location: Right Arm)   Pulse 77   Temp 98.4 F (36.9 C) (Oral)   Resp 16   SpO2 98%   Physical Exam  Constitutional: He is oriented to person, place, and time. He appears well-developed. No distress.  HENT:  Head: Normocephalic and atraumatic.  Left forehead 5 cm laceration macerated edges  Eyes: Conjunctivae and EOM are normal.  Neck: No spinous process tenderness and no muscular tenderness present. No neck rigidity. No erythema and normal range of motion present.  Cardiovascular: Normal rate and regular rhythm.   Pulmonary/Chest: Effort normal. No stridor. No respiratory distress.  Abdominal: He exhibits no distension.  Musculoskeletal: He exhibits no edema.  Right elbow: Normal.      Left elbow: Normal.  Neurological: He is alert and oriented to person, place, and time. He displays no atrophy and no tremor. No sensory deficit. He exhibits normal muscle tone. He displays no seizure activity.  Skin: Skin is warm and dry.     Psychiatric: He has a normal mood and affect.  Nursing note and vitals reviewed.    ED Treatments / Results    After my initial evaluation patient had wound care performed by nursing staff including cleaning of multiple areas of skin tear, abrasion. Patient was placed on pulse oximetry, and pulse oxygen rating of 97% on room air this is normal.   LACERATION REPAIR Performed by: Carmin Muskrat Authorized by: Carmin Muskrat Consent: Verbal consent obtained. Risks and benefits: risks, benefits and alternatives were discussed Consent given by: patient Patient identity confirmed: provided demographic data Prepped and Draped in normal sterile fashion Wound explored  Laceration Location: L forehead  Laceration Length: 5cm  No Foreign Bodies seen or palpated  Anesthesia: local infiltration  Local anesthetic: lidocaine 1% w epinephrine  Anesthetic total: 3 ml  Irrigation method: syringe Amount of cleaning: standard  Skin closure: 6-0 sutures  Number of sutures: 4  Technique: close (as possible)  Patient tolerance: Patient tolerated the procedure well with no immediate complications.    Procedures Procedures (including critical care time)  Medications Ordered in ED Medications  lidocaine-EPINEPHrine (XYLOCAINE W/EPI) 2 %-1:200000 (PF) injection 10 mL (not administered)     Initial Impression / Assessment and Plan / ED Course  I have reviewed the triage vital signs and the nursing notes.  Pertinent labs & imaging results that were available during my care of the patient were reviewed by me and considered in my medical decision making (see chart for details).   Patient presents after mechanical fall with minimal pain, no neurologic deficiencies, no neck pain, no loss of consciousness, and there is low suspicion for intracranial pathology. However, patient does have multiple cutaneous wounds, requiring wound care by nursing staff, as well as laceration repair by myself, performed without complication. She discharged in the care of his wife with outpatient follow-up.    Carmin Muskrat, MD 08/11/16 [phone]

## 2016-08-11 NOTE — ED Triage Notes (Signed)
To ED for eval after tripping and falling at home. States he was walking on his porch, tripped, and fell hitting head on bricks. Per EMS pt's lac to head is possibly an arterial lac. Pt denies syncope/loc. Alert and oriented. Denies pain. Moves all extremites

## 2016-08-18 ENCOUNTER — Ambulatory Visit: Payer: PPO | Admitting: Emergency Medicine

## 2016-08-20 ENCOUNTER — Encounter: Payer: Self-pay | Admitting: Emergency Medicine

## 2016-08-20 ENCOUNTER — Ambulatory Visit (INDEPENDENT_AMBULATORY_CARE_PROVIDER_SITE_OTHER): Payer: PPO | Admitting: Emergency Medicine

## 2016-08-20 VITALS — BP 132/78 | HR 96 | Temp 98.4°F | Resp 17 | Ht 66.0 in | Wt 133.0 lb

## 2016-08-20 DIAGNOSIS — S0181XD Laceration without foreign body of other part of head, subsequent encounter: Secondary | ICD-10-CM

## 2016-08-20 DIAGNOSIS — Z4802 Encounter for removal of sutures: Secondary | ICD-10-CM

## 2016-08-20 NOTE — Patient Instructions (Addendum)
     IF you received an x-ray today, you will receive an invoice from [facility]. Please contact [facility] at [phone] with questions or concerns regarding your invoice.   IF you received labwork today, you will receive an invoice from [facility]. Please contact [facility] at [phone] with questions or concerns regarding your invoice.   Our billing staff will not be able to assist you with questions regarding bills from these companies.  You will be contacted with the lab results as soon as they are available. The fastest way to get your results is to activate your My Chart account. Instructions are located on the last page of this paperwork. If you have not heard from us regarding the results in 2 weeks, please contact this office.    Suture Removal, Care After Refer to this sheet in the next few weeks. These instructions provide you with information on caring for yourself after your procedure. Your health care provider may also give you more specific instructions. Your treatment has been planned according to current medical practices, but problems sometimes occur. Call your health care provider if you have any problems or questions after your procedure. What can I expect after the procedure? After your stitches (sutures) are removed, it is typical to have the following:  Some discomfort and swelling in the wound area.  Slight redness in the area.  Follow these instructions at home:  If you have skin adhesive strips over the wound area, do not take the strips off. They will fall off on their own in a few days. If the strips remain in place after 14 days, you may remove them.  Change any bandages (dressings) at least once a day or as directed by your health care provider. If the bandage sticks, soak it off with warm, soapy water.  Apply cream or ointment only as directed by your health care provider. If using cream or ointment, wash the area with soap and water 2  times a day to remove all the cream or ointment. Rinse off the soap and pat the area dry with a clean towel.  Keep the wound area dry and clean. If the bandage becomes wet or dirty, or if it develops a bad smell, change it as soon as possible.  Continue to protect the wound from injury.  Use sunscreen when out in the sun. New scars become sunburned easily. Contact a health care provider if:  You have increasing redness, swelling, or pain in the wound.  You see pus coming from the wound.  You have a fever.  You notice a bad smell coming from the wound or dressing.  Your wound breaks open (edges not staying together). This information is not intended to replace advice given to you by your health care provider. Make sure you discuss any questions you have with your health care provider. Document Released: 11/05/2000 Document Revised: 07/19/2015 Document Reviewed: 09/22/2012 Elsevier Interactive Patient Education  2017 [employer]  

## 2016-08-20 NOTE — Progress Notes (Signed)
Edwin Torres 61 y.o.   Chief Complaint  Patient presents with  . Suture / Staple Removal    HISTORY OF PRESENT ILLNESS: This is a 61 y.o. male here for suture removal of left eyebrow laceration sustained 9 days ago. No complaints and doing well.  HPI   Prior to Admission medications   Medication Sig Start Date End Date Taking? Authorizing Provider  ibuprofen (ADVIL,MOTRIN) 200 MG tablet Take 600 mg by mouth every 6 (six) hours as needed for moderate pain.    [provider]  mupirocin cream (BACTROBAN) 2 % Apply 1 application topically 2 (two) times daily. Patient not taking: Reported on 08/20/2016 07/11/16   Randal Buba, April, MD    Allergies  Allergen Reactions  . Penicillins Anaphylaxis    Has patient had a PCN reaction causing immediate rash, facial/tongue/throat swelling, SOB or lightheadedness with hypotension: Yes Has patient had a PCN reaction causing severe rash involving mucus membranes or skin necrosis: No Has patient had a PCN reaction that required hospitalization: Yes Has patient had a PCN reaction occurring within the last 10 years: No If all of the above answers are "NO", then may proceed with Cephalosporin use.     Patient Active Problem List   Diagnosis Date Noted  . Weakness of right third cranial nerve 03/21/2016    Past Medical History:  Diagnosis Date  . Alcoholic ([place])   . Seizures ([place])     Past Surgical History:  Procedure Laterality Date  . VASECTOMY      Social History   Social History  . Marital status: Married    Spouse name: N/A  . Number of children: N/A  . Years of education: N/A   Occupational History  . Not on file.   Social History Main Topics  . Smoking status: Current Every Day Smoker  . Smokeless tobacco: Current User  . Alcohol use 4.8 oz/week    8 Cans of beer per week     Comment: daily  . Drug use: No  . Sexual activity: Yes    Partners: Female    Birth control/ protection: None   Other Topics  Concern  . Not on file   Social History Narrative  . No narrative on file    Family History  Problem Relation Age of Onset  . Cancer Mother   . Diabetes Mother   . Hyperlipidemia Father      Review of Systems  Constitutional: Negative.  Negative for chills and fever.  HENT: Negative.   Eyes: Negative.  Negative for blurred vision and double vision.  Respiratory: Negative for shortness of breath.   Cardiovascular: Negative.  Negative for chest pain.  Gastrointestinal: Negative for nausea and vomiting.  Skin: Negative for rash.  Neurological: Negative for dizziness and headaches.  All other systems reviewed and are negative.  Vitals:   08/20/16 1722  BP: 132/78  Pulse: 96  Resp: 17  Temp: 98.4 F (36.9 C)     Physical Exam  Constitutional: He is oriented to person, place, and time. He appears well-developed and well-nourished.  HENT:  Head: Normocephalic and atraumatic.  Eyes: Conjunctivae and EOM are normal. Pupils are equal, round, and reactive to light.  Neck: Normal range of motion. Neck supple.  Cardiovascular: Normal rate.   Pulmonary/Chest: Effort normal.  Musculoskeletal: Normal range of motion.  Neurological: He is alert and oriented to person, place, and time.  Skin: Skin is warm and dry. Capillary refill takes less than 2 seconds.  Left eyebrow area: well healed scabbed over laceration with 4 sutures in place.  Psychiatric: He has a normal mood and affect. His behavior is normal.  Vitals reviewed.  Patient presents for suture removal. The wound is well healed without signs of infection.  The sutures are removed. Wound care and activity instructions given. Return prn.   ASSESSMENT & PLAN: Edwin Torres was seen today for suture / staple removal.  Diagnoses and all orders for this visit:  Facial laceration, subsequent encounter  Visit for suture removal    Patient Instructions       IF you received an x-ray today, you will receive an invoice from  [facility]. Please contact [facility] at [phone] with questions or concerns regarding your invoice.   IF you received labwork today, you will receive an invoice from [facility]. Please contact [facility] at [phone] with questions or concerns regarding your invoice.   Our billing staff will not be able to assist you with questions regarding bills from these companies.  You will be contacted with the lab results as soon as they are available. The fastest way to get your results is to activate your My Chart account. Instructions are located on the last page of this paperwork. If you have not heard from [place] regarding the results in 2 weeks, please contact this office.     Suture Removal, Care After Refer to this sheet in the next few weeks. These instructions provide you with information on caring for yourself after your procedure. Your health care provider may also give you more specific instructions. Your treatment has been planned according to current medical practices, but problems sometimes occur. Call your health care provider if you have any problems or questions after your procedure. What can I expect after the procedure? After your stitches (sutures) are removed, it is typical to have the following:  Some discomfort and swelling in the wound area.  Slight redness in the area.  Follow these instructions at home:  If you have skin adhesive strips over the wound area, do not take the strips off. They will fall off on their own in a few days. If the strips remain in place after 14 days, you may remove them.  Change any bandages (dressings) at least once a day or as directed by your health care provider. If the bandage sticks, soak it off with warm, soapy water.  Apply cream or ointment only as directed by your health care provider. If using cream or ointment, wash the area with soap and water 2 times a day to remove all the cream or ointment. Rinse off the soap and  pat the area dry with a clean towel.  Keep the wound area dry and clean. If the bandage becomes wet or dirty, or if it develops a bad smell, change it as soon as possible.  Continue to protect the wound from injury.  Use sunscreen when out in the sun. New scars become sunburned easily. Contact a health care provider if:  You have increasing redness, swelling, or pain in the wound.  You see pus coming from the wound.  You have a fever.  You notice a bad smell coming from the wound or dressing.  Your wound breaks open (edges not staying together). This information is not intended to replace advice given to you by your health care provider. Make sure you discuss any questions you have with your health care provider. Document Released: 11/05/2000 Document Revised: 07/19/2015 Document Reviewed: 09/22/2012 Elsevier Interactive  Patient Education  2017 [employer].      Agustina Caroli, MD [facility]

## 2016-11-18 ENCOUNTER — Ambulatory Visit: Payer: PPO | Admitting: Neurology

## 2017-06-22 ENCOUNTER — Emergency Department (HOSPITAL_COMMUNITY): Payer: PPO

## 2017-06-22 ENCOUNTER — Encounter (HOSPITAL_COMMUNITY): Payer: Self-pay | Admitting: Emergency Medicine

## 2017-06-22 ENCOUNTER — Emergency Department (HOSPITAL_COMMUNITY)
Admission: EM | Admit: 2017-06-22 | Discharge: 2017-06-22 | Disposition: A | Discharge status: Home / Self Care | Payer: PPO | Attending: Emergency Medicine | Admitting: Emergency Medicine

## 2017-06-22 DIAGNOSIS — S098XXA Other specified injuries of head, initial encounter: Secondary | ICD-10-CM | POA: Diagnosis not present

## 2017-06-22 DIAGNOSIS — W1830XA Fall on same level, unspecified, initial encounter: Secondary | ICD-10-CM | POA: Insufficient documentation

## 2017-06-22 DIAGNOSIS — Y92000 Kitchen of unspecified non-institutional (private) residence as  the place of occurrence of the external cause: Secondary | ICD-10-CM | POA: Diagnosis not present

## 2017-06-22 DIAGNOSIS — S199XXA Unspecified injury of neck, initial encounter: Secondary | ICD-10-CM | POA: Diagnosis not present

## 2017-06-22 DIAGNOSIS — Y999 Unspecified external cause status: Secondary | ICD-10-CM | POA: Diagnosis not present

## 2017-06-22 DIAGNOSIS — F17200 Nicotine dependence, unspecified, uncomplicated: Secondary | ICD-10-CM | POA: Insufficient documentation

## 2017-06-22 DIAGNOSIS — W19XXXA Unspecified fall, initial encounter: Secondary | ICD-10-CM

## 2017-06-22 DIAGNOSIS — S0181XA Laceration without foreign body of other part of head, initial encounter: Secondary | ICD-10-CM | POA: Diagnosis not present

## 2017-06-22 DIAGNOSIS — Y939 Activity, unspecified: Secondary | ICD-10-CM | POA: Diagnosis not present

## 2017-06-22 DIAGNOSIS — S0180XA Unspecified open wound of other part of head, initial encounter: Secondary | ICD-10-CM | POA: Diagnosis not present

## 2017-06-22 DIAGNOSIS — S0990XA Unspecified injury of head, initial encounter: Secondary | ICD-10-CM | POA: Diagnosis not present

## 2017-06-22 MED ORDER — LIDOCAINE-EPINEPHRINE (PF) 2 %-1:200000 IJ SOLN
30.0000 mL | Freq: Once | INTRAMUSCULAR | Status: DC
  Filled 2017-06-22: qty 40

## 2017-06-22 NOTE — ED Notes (Signed)
Bed: [facility] Expected date:  Expected time:  Means of arrival:  Comments: Pt moved to 5 for sutures

## 2017-06-22 NOTE — ED Notes (Signed)
Bed: WA05 Expected date:  Expected time:  Means of arrival:  Comments: 

## 2017-06-22 NOTE — ED Provider Notes (Signed)
[facility] DEPT Provider Note   CSN: [id number] Arrival date & time: 06/22/17  0353     History   Chief Complaint Chief Complaint  Patient presents with  . Head Injury  . Fall  . Alcohol Intoxication    HPI Edwin Torres is a 62 y.o. male.  The history is provided by the patient.  Head Injury   The incident occurred less than 1 hour ago. He came to the ER via EMS. The injury mechanism was a fall. There was no loss of consciousness. The volume of blood lost was minimal. The quality of the pain is described as dull. The pain is mild. Pertinent negatives include no numbness, no blurred vision, no vomiting, no tinnitus, no disorientation, no weakness and no memory loss. He was found conscious by EMS personnel. Treatment on the scene included a c-collar. He has tried nothing for the symptoms. The treatment provided no relief.    Past Medical History:  Diagnosis Date  . Alcoholic ([place])   . Seizures [facility])     Patient Active Problem List   Diagnosis Date Noted  . Visit for suture removal 08/20/2016  . Weakness of right third cranial nerve 03/21/2016    Past Surgical History:  Procedure Laterality Date  . VASECTOMY          Home Medications    Prior to Admission medications   Medication Sig Start Date End Date Taking? Authorizing Provider  ibuprofen (ADVIL,MOTRIN) 200 MG tablet Take 600 mg by mouth every 6 (six) hours as needed for moderate pain.   Yes [provider]  mupirocin cream (BACTROBAN) 2 % Apply 1 application topically 2 (two) times daily. Patient not taking: Reported on 08/20/2016 07/11/16   Randal Buba, Damiel Barthold, MD    Family History Family History  Problem Relation Age of Onset  . Cancer Mother   . Diabetes Mother   . Hyperlipidemia Father     Social History Social History   Tobacco Use  . Smoking status: Current Every Day Smoker  . Smokeless tobacco: Current User  Substance Use Topics  . Alcohol use: Yes   Alcohol/week: 4.8 oz    Types: 8 Cans of beer per week    Comment: daily  . Drug use: No     Allergies   Penicillins   Review of Systems Review of Systems  Constitutional: Negative for fever.  HENT: Negative for tinnitus.   Eyes: Negative for blurred vision, photophobia and visual disturbance.  Respiratory: Negative for shortness of breath.   Gastrointestinal: Negative for vomiting.  Skin: Positive for wound.  Neurological: Negative for facial asymmetry, weakness and numbness.  Psychiatric/Behavioral: Negative for memory loss.  All other systems reviewed and are negative.    Physical Exam Updated Vital Signs BP 107/82 (BP Location: Left Arm)   Pulse 92   Temp 98.1 F (36.7 C) (Oral)   Resp 18   SpO2 98%   Physical Exam  Constitutional: He is oriented to person, place, and time. He appears well-developed and well-nourished. No distress.  HENT:  Head: Normocephalic.  Right Ear: External ear normal.  Left Ear: External ear normal.  Nose: Nose normal.  Mouth/Throat: Oropharynx is clear and moist. No oropharyngeal exudate.  Eyes: Pupils are equal, round, and reactive to light. Conjunctivae are normal.  Neck: Normal range of motion. Neck supple.  Cardiovascular: Normal rate, regular rhythm, normal heart sounds and intact distal pulses.  Pulmonary/Chest: Effort normal and breath sounds normal. No stridor. He has  no wheezes. He has no rales.  Abdominal: Soft. Bowel sounds are normal. He exhibits no mass. There is no tenderness. There is no rebound and no guarding.  Musculoskeletal: Normal range of motion.  Neurological: He is alert and oriented to person, place, and time. He displays normal reflexes.  Skin: Skin is warm and dry. Capillary refill takes less than 2 seconds.  Psychiatric: He has a normal mood and affect.     ED Treatments / Results  Labs (all labs ordered are listed, but only abnormal results are displayed) Labs Reviewed - No data to  display  EKG None  Radiology No results found.  Procedures .Marland KitchenLaceration Repair Date/Time: 06/22/2017 7:31 AM Performed by: Veatrice Kells, MD Authorized by: Veatrice Kells, MD   Consent:    Consent obtained:  Verbal   Consent given by:  Patient   Risks discussed:  Infection, need for additional repair, nerve damage, poor wound healing, poor cosmetic result, pain, retained foreign body, tendon damage and vascular damage   Alternatives discussed:  No treatment Anesthesia (see MAR for exact dosages):    Anesthesia method:  Local infiltration   Local anesthetic:  Lidocaine 2% WITH epi Laceration details:    Location: forehead.   Length (cm):  2   Depth (mm):  1 Repair type:    Repair type:  Simple Pre-procedure details:    Preparation:  Patient was prepped and draped in usual sterile fashion Exploration:    Hemostasis achieved with:  Direct pressure   Wound exploration: wound explored through full range of motion     Wound extent: no areolar tissue violation noted     Contaminated: no   Treatment:    Area cleansed with:  Betadine   Amount of cleaning:  Extensive   Irrigation solution:  Sterile saline   Visualized foreign bodies/material removed: no   Skin repair:    Repair method:  Sutures   Suture size:  6-0   Suture material:  Prolene   Suture technique:  Simple interrupted   Number of sutures:  6 Approximation:    Approximation:  Close Post-procedure details:    Dressing:  Open (no dressing)   Patient tolerance of procedure:  Tolerated well, no immediate complications   (including critical care time)  Medications Ordered in ED Medications  lidocaine-EPINEPHrine (XYLOCAINE W/EPI) 2 %-1:200000 (PF) injection 30 mL (has no administration in time range)      Final Clinical Impressions(s) / ED Diagnoses   Suture removal in 5 days.   Return for weakness, numbness, changes in vision or speech, fevers >100.4 unrelieved by medication, shortness of breath,  intractable vomiting, or diarrhea, abdominal pain, Inability to tolerate liquids or food, cough, altered mental status or any concerns. No signs of systemic illness or infection. The patient is nontoxic-appearing on exam and vital signs are within normal limits.   I have reviewed the triage vital signs and the nursing notes. Pertinent labs &imaging results that were available during my care of the patient were reviewed by me and considered in my medical decision making (see chart for details).  After history, exam, and medical workup I feel the patient has been appropriately medically screened and is safe for discharge home. Pertinent diagnoses were discussed with the patient. Patient was given return precautions.      Jazalyn Mondor, MD 06/22/17 [phone]

## 2017-06-22 NOTE — ED Notes (Signed)
Bed: WA06 Expected date:  Expected time:  Means of arrival:  Comments: 

## 2017-06-22 NOTE — ED Triage Notes (Addendum)
Per ems ,pt. From home with complaint of head injury after a fall at 2am today in his kitchen, pt. Admitted of etoh. Denied LOC. Laceration with bleeding on left forehead , denied of taking blood thinner.  Per EMS , pt. Was ambulatory towards EMS stretcher . Dressing applied , bleeding controlled.alert and oriented x2.

## 2017-06-22 NOTE — ED Notes (Signed)
Bed: [facility] Expected date:  Expected time:  Means of arrival:  Comments: 62 yo M/ Fall

## 2017-07-15 ENCOUNTER — Emergency Department (HOSPITAL_COMMUNITY)
Admission: EM | Admit: 2017-07-15 | Discharge: 2017-07-16 | Disposition: A | Discharge status: Home / Self Care | Payer: PPO | Attending: Emergency Medicine | Admitting: Emergency Medicine

## 2017-07-15 ENCOUNTER — Encounter (HOSPITAL_COMMUNITY): Payer: Self-pay

## 2017-07-15 ENCOUNTER — Emergency Department (HOSPITAL_COMMUNITY): Payer: PPO

## 2017-07-15 DIAGNOSIS — R410 Disorientation, unspecified: Secondary | ICD-10-CM | POA: Diagnosis not present

## 2017-07-15 DIAGNOSIS — R404 Transient alteration of awareness: Secondary | ICD-10-CM | POA: Diagnosis not present

## 2017-07-15 DIAGNOSIS — S50812A Abrasion of left forearm, initial encounter: Secondary | ICD-10-CM | POA: Insufficient documentation

## 2017-07-15 DIAGNOSIS — R270 Ataxia, unspecified: Secondary | ICD-10-CM | POA: Diagnosis not present

## 2017-07-15 DIAGNOSIS — R531 Weakness: Secondary | ICD-10-CM | POA: Diagnosis not present

## 2017-07-15 DIAGNOSIS — Y999 Unspecified external cause status: Secondary | ICD-10-CM | POA: Diagnosis not present

## 2017-07-15 DIAGNOSIS — Y929 Unspecified place or not applicable: Secondary | ICD-10-CM | POA: Insufficient documentation

## 2017-07-15 DIAGNOSIS — F10120 Alcohol abuse with intoxication, uncomplicated: Secondary | ICD-10-CM | POA: Insufficient documentation

## 2017-07-15 DIAGNOSIS — Y908 Blood alcohol level of 240 mg/100 ml or more: Secondary | ICD-10-CM | POA: Diagnosis not present

## 2017-07-15 DIAGNOSIS — T07XXXA Unspecified multiple injuries, initial encounter: Secondary | ICD-10-CM

## 2017-07-15 DIAGNOSIS — S50811A Abrasion of right forearm, initial encounter: Secondary | ICD-10-CM | POA: Insufficient documentation

## 2017-07-15 DIAGNOSIS — S0990XA Unspecified injury of head, initial encounter: Secondary | ICD-10-CM | POA: Diagnosis not present

## 2017-07-15 DIAGNOSIS — F10920 Alcohol use, unspecified with intoxication, uncomplicated: Secondary | ICD-10-CM

## 2017-07-15 DIAGNOSIS — W19XXXA Unspecified fall, initial encounter: Secondary | ICD-10-CM | POA: Diagnosis not present

## 2017-07-15 DIAGNOSIS — F17210 Nicotine dependence, cigarettes, uncomplicated: Secondary | ICD-10-CM | POA: Insufficient documentation

## 2017-07-15 DIAGNOSIS — Y939 Activity, unspecified: Secondary | ICD-10-CM | POA: Insufficient documentation

## 2017-07-15 DIAGNOSIS — F10129 Alcohol abuse with intoxication, unspecified: Secondary | ICD-10-CM | POA: Diagnosis not present

## 2017-07-15 LAB — COMPREHENSIVE METABOLIC PANEL
ALK PHOS: 94 U/L (ref 38–126)
ALT: 32 U/L (ref 17–63)
AST: 56 U/L — AB (ref 15–41)
Albumin: 3.7 g/dL (ref 3.5–5.0)
Anion gap: 16 — ABNORMAL HIGH (ref 5–15)
BUN: 5 mg/dL — AB (ref 6–20)
CALCIUM: 8.3 mg/dL — AB (ref 8.9–10.3)
CHLORIDE: 100 mmol/L — AB (ref 101–111)
CO2: 19 mmol/L — ABNORMAL LOW (ref 22–32)
CREATININE: 0.5 mg/dL — AB (ref 0.61–1.24)
GFR calc Af Amer: >60 mL/min (ref >60)
Glucose, Bld: 101 mg/dL — ABNORMAL HIGH (ref 65–99)
Potassium: 3.5 mmol/L (ref 3.5–5.1)
Sodium: 135 mmol/L (ref 135–145)
Total Bilirubin: 0.6 mg/dL (ref 0.3–1.2)
Total Protein: 6.7 g/dL (ref 6.5–8.1)

## 2017-07-15 LAB — CBC WITH DIFFERENTIAL/PLATELET
BASOS ABS: 0.1 10*3/uL (ref 0.0–0.1)
Basophils Relative: 1 %
EOS PCT: 1 %
Eosinophils Absolute: 0 10*3/uL (ref 0.0–0.7)
HCT: 37.2 % — ABNORMAL LOW (ref 39.0–52.0)
Hemoglobin: 12.7 g/dL — ABNORMAL LOW (ref 13.0–17.0)
LYMPHS PCT: 38 %
Lymphs Abs: 2.5 10*3/uL (ref 0.7–4.0)
MCH: 33.5 pg (ref 26.0–34.0)
MCHC: 34.1 g/dL (ref 30.0–36.0)
MCV: 98.2 fL (ref 78.0–100.0)
MONO ABS: 0.6 10*3/uL (ref 0.1–1.0)
Monocytes Relative: 9 %
Neutro Abs: 3.4 10*3/uL (ref 1.7–7.7)
Neutrophils Relative %: 53 %
PLATELETS: 169 10*3/uL (ref 150–400)
RBC: 3.79 MIL/uL — ABNORMAL LOW (ref 4.22–5.81)
RDW: 12.4 % (ref 11.5–15.5)
WBC: 6.5 10*3/uL (ref 4.0–10.5)

## 2017-07-15 LAB — ETHANOL: ALCOHOL ETHYL (B): 353 mg/dL — AB (ref <10)

## 2017-07-15 NOTE — ED Triage Notes (Signed)
Pt has two falls this evening, no complaints of injury Pt is an alcoholic and is altered, oriented to self and location only

## 2017-07-15 NOTE — ED Notes (Signed)
Pt's wife states that he has been falling a lot recently and now has become incontinent of urine an feces, he drinks all day and is home alone, pt has a bad memory and has been depressed for about 4 years,  Wife states that when he was here last he was threatening to kill her but staff did not hear him state this

## 2017-07-15 NOTE — ED Notes (Signed)
Bed: EG31 Expected date:  Expected time:  Means of arrival:  Comments: 62 yr old confused, ETOH

## 2017-07-15 NOTE — ED Provider Notes (Addendum)
[facility] DEPT Provider Note   CSN: [id number] Arrival date & time: 07/15/17  2048     History   Chief Complaint Chief Complaint  Patient presents with  . Fall  . intoxicated    HPI Edwin Torres is a 62 y.o. male. Level 5 caveat due to intoxication. HPI Patient presents after falls.  Some confusion.  Has been drinking alcohol.  History of alcoholism.  Patient appears intoxicated at this time.  States he was having trouble walking earlier.  States he has not drank all that much. Past Medical History:  Diagnosis Date  . Alcoholic ([place])   . Seizures [facility])     Patient Active Problem List   Diagnosis Date Noted  . Visit for suture removal 08/20/2016  . Weakness of right third cranial nerve 03/21/2016    Past Surgical History:  Procedure Laterality Date  . VASECTOMY          Home Medications    Prior to Admission medications   Medication Sig Start Date End Date Taking? Authorizing Provider  ibuprofen (ADVIL,MOTRIN) 200 MG tablet Take 600 mg by mouth every 6 (six) hours as needed for moderate pain.   Yes [provider]  mupirocin cream (BACTROBAN) 2 % Apply 1 application topically 2 (two) times daily. Patient not taking: Reported on 08/20/2016 07/11/16   Randal Buba, April, MD    Family History Family History  Problem Relation Age of Onset  . Cancer Mother   . Diabetes Mother   . Hyperlipidemia Father     Social History Social History   Tobacco Use  . Smoking status: Current Every Day Smoker  . Smokeless tobacco: Current User  Substance Use Topics  . Alcohol use: Yes    Alcohol/week: 4.8 oz    Types: 8 Cans of beer per week    Comment: daily  . Drug use: No     Allergies   Penicillins and Ativan [lorazepam]   Review of Systems Review of Systems  Unable to perform ROS: Mental status change     Physical Exam Updated Vital Signs BP 126/73 (BP Location: Right Arm)   Pulse 75   Temp 97.6 F (36.4 C)  (Oral)   Resp 17   SpO2 98%   Physical Exam  Constitutional: He appears well-developed.  HENT:  Head: Atraumatic.  Eyes: Pupils are equal, round, and reactive to light.  Cardiovascular: Normal rate.  Pulmonary/Chest: Effort normal.  Abdominal: Soft. There is no tenderness.  Musculoskeletal:  Skin tears of bilateral forearms.  Dressing on right forearm from previous skin tears states it is been around 2 weeks.  No extremity tenderness.  Neurological: He is alert.  Patient is alert but somewhat confused.  Smells of alcohol.  Skin: Skin is warm. Capillary refill takes less than 2 seconds.     ED Treatments / Results  Labs (all labs ordered are listed, but only abnormal results are displayed) Labs Reviewed  COMPREHENSIVE METABOLIC PANEL - Abnormal; Notable for the following components:      Result Value   Chloride 100 (*)    CO2 19 (*)    Glucose, Bld 101 (*)    BUN 5 (*)    Creatinine, Ser 0.50 (*)    Calcium 8.3 (*)    AST 56 (*)    Anion gap 16 (*)    All other components within normal limits  ETHANOL - Abnormal; Notable for the following components:   Alcohol, Ethyl (B) 353 (*)  All other components within normal limits  CBC WITH DIFFERENTIAL/PLATELET - Abnormal; Notable for the following components:   RBC 3.79 (*)    Hemoglobin 12.7 (*)    HCT 37.2 (*)    All other components within normal limits    EKG None  Radiology Ct Head Wo Contrast  Result Date: 07/15/2017 CLINICAL DATA:  Head trauma and ataxia EXAM: CT HEAD WITHOUT CONTRAST TECHNIQUE: Contiguous axial images were obtained from the base of the skull through the vertex without intravenous contrast. COMPARISON:  Head CT 06/22/2017 FINDINGS: Brain: There is no mass, hemorrhage or extra-axial collection. No evidence of acute cortical infarct. There is generalized atrophy without lobar predilection. There is hypoattenuation of the periventricular white matter, most commonly indicating chronic ischemic  microangiopathy. Vascular: No hyperdense vessel or unexpected vascular calcification. Skull: Normal visualized skull base, calvarium and extracranial soft tissues. Sinuses/Orbits: Trace right mastoid fluid.  The orbits are normal. IMPRESSION: Atrophy and chronic small vessel disease without acute intracranial abnormality. Electronically Signed   By: Ulyses Jarred M.D.   On: 07/15/2017 21:49    Procedures Procedures (including critical care time)  Medications Ordered in ED Medications - No data to display   Initial Impression / Assessment and Plan / ED Course  I have reviewed the triage vital signs and the nursing notes.  Pertinent labs & imaging results that were available during my care of the patient were reviewed by me and considered in my medical decision making (see chart for details).     Patient with alcohol intoxication and fall.  Head CT reassuring.  Alcohol level is 350.  Patient's wife is here. patient states he does not want to stay but I think at this time he would do better for little monitoring.  Wife states he could not walk earlier and is likely related to the alcohol.  However if patient is not willing to stay especially once he becomes more sober I do not think we can keep him. Final Clinical Impressions(s) / ED Diagnoses   Final diagnoses:  Fall, initial encounter  Alcoholic intoxication without complication [facility])  Multiple abrasions    ED Discharge Orders    None      Davonna Belling, MD 07/15/17 2344  Davonna Belling, MD 07/15/17 [phone]

## 2017-07-16 NOTE — ED Notes (Signed)
Pt's wife said to call her when he is discharged [place] [phone]

## 2017-07-16 NOTE — ED Notes (Signed)
Pt unable to manage using urinal bed linen and clothing changed

## 2017-07-16 NOTE — ED Provider Notes (Signed)
7:00 AM Patient now awake and alert.  He is able to ambulate at his baseline (patient has abnormal gait due to remote bilateral heel injuries).  He is tremulous but declines Ativan due to adverse reactions in the past.     Shanon Rosser, MD 07/16/17 [phone]

## 2019-02-22 ENCOUNTER — Other Ambulatory Visit: Payer: Self-pay

## 2019-02-22 ENCOUNTER — Encounter (HOSPITAL_COMMUNITY): Payer: Self-pay | Admitting: Emergency Medicine

## 2019-02-22 ENCOUNTER — Inpatient Hospital Stay (HOSPITAL_COMMUNITY)
Admission: EM | Admit: 2019-02-22 | Discharge: 2019-03-03 | DRG: 896 | Disposition: A | Discharge status: Skilled Nursing Facility | Payer: PPO | Attending: Internal Medicine | Admitting: Internal Medicine

## 2019-02-22 ENCOUNTER — Emergency Department (HOSPITAL_COMMUNITY): Payer: PPO

## 2019-02-22 DIAGNOSIS — E871 Hypo-osmolality and hyponatremia: Secondary | ICD-10-CM | POA: Diagnosis present

## 2019-02-22 DIAGNOSIS — M25562 Pain in left knee: Secondary | ICD-10-CM | POA: Diagnosis not present

## 2019-02-22 DIAGNOSIS — R400 Somnolence: Secondary | ICD-10-CM | POA: Diagnosis not present

## 2019-02-22 DIAGNOSIS — Z20822 Contact with and (suspected) exposure to covid-19: Secondary | ICD-10-CM | POA: Diagnosis present

## 2019-02-22 DIAGNOSIS — M6281 Muscle weakness (generalized): Secondary | ICD-10-CM | POA: Diagnosis not present

## 2019-02-22 DIAGNOSIS — Z23 Encounter for immunization: Secondary | ICD-10-CM | POA: Diagnosis not present

## 2019-02-22 DIAGNOSIS — G92 Toxic encephalopathy: Secondary | ICD-10-CM | POA: Diagnosis present

## 2019-02-22 DIAGNOSIS — Z720 Tobacco use: Secondary | ICD-10-CM | POA: Diagnosis not present

## 2019-02-22 DIAGNOSIS — I6523 Occlusion and stenosis of bilateral carotid arteries: Secondary | ICD-10-CM | POA: Diagnosis not present

## 2019-02-22 DIAGNOSIS — R1312 Dysphagia, oropharyngeal phase: Secondary | ICD-10-CM | POA: Diagnosis not present

## 2019-02-22 DIAGNOSIS — S0990XA Unspecified injury of head, initial encounter: Secondary | ICD-10-CM | POA: Diagnosis not present

## 2019-02-22 DIAGNOSIS — E8729 Other acidosis: Secondary | ICD-10-CM | POA: Diagnosis present

## 2019-02-22 DIAGNOSIS — F17200 Nicotine dependence, unspecified, uncomplicated: Secondary | ICD-10-CM | POA: Diagnosis not present

## 2019-02-22 DIAGNOSIS — D696 Thrombocytopenia, unspecified: Secondary | ICD-10-CM | POA: Diagnosis present

## 2019-02-22 DIAGNOSIS — Z7401 Bed confinement status: Secondary | ICD-10-CM | POA: Diagnosis not present

## 2019-02-22 DIAGNOSIS — R58 Hemorrhage, not elsewhere classified: Secondary | ICD-10-CM | POA: Diagnosis not present

## 2019-02-22 DIAGNOSIS — F10229 Alcohol dependence with intoxication, unspecified: Secondary | ICD-10-CM | POA: Diagnosis present

## 2019-02-22 DIAGNOSIS — D539 Nutritional anemia, unspecified: Secondary | ICD-10-CM | POA: Diagnosis present

## 2019-02-22 DIAGNOSIS — R5381 Other malaise: Secondary | ICD-10-CM | POA: Diagnosis not present

## 2019-02-22 DIAGNOSIS — E872 Acidosis: Secondary | ICD-10-CM | POA: Diagnosis not present

## 2019-02-22 DIAGNOSIS — E860 Dehydration: Secondary | ICD-10-CM | POA: Diagnosis present

## 2019-02-22 DIAGNOSIS — W1830XA Fall on same level, unspecified, initial encounter: Secondary | ICD-10-CM | POA: Diagnosis not present

## 2019-02-22 DIAGNOSIS — R41841 Cognitive communication deficit: Secondary | ICD-10-CM | POA: Diagnosis not present

## 2019-02-22 DIAGNOSIS — W19XXXA Unspecified fall, initial encounter: Secondary | ICD-10-CM

## 2019-02-22 DIAGNOSIS — F10129 Alcohol abuse with intoxication, unspecified: Secondary | ICD-10-CM | POA: Diagnosis not present

## 2019-02-22 DIAGNOSIS — E876 Hypokalemia: Secondary | ICD-10-CM | POA: Diagnosis not present

## 2019-02-22 DIAGNOSIS — R296 Repeated falls: Secondary | ICD-10-CM | POA: Diagnosis not present

## 2019-02-22 DIAGNOSIS — D6959 Other secondary thrombocytopenia: Secondary | ICD-10-CM | POA: Diagnosis not present

## 2019-02-22 DIAGNOSIS — F10920 Alcohol use, unspecified with intoxication, uncomplicated: Secondary | ICD-10-CM | POA: Diagnosis not present

## 2019-02-22 DIAGNOSIS — S0003XA Contusion of scalp, initial encounter: Secondary | ICD-10-CM | POA: Diagnosis not present

## 2019-02-22 DIAGNOSIS — F10239 Alcohol dependence with withdrawal, unspecified: Principal | ICD-10-CM | POA: Diagnosis present

## 2019-02-22 DIAGNOSIS — S199XXA Unspecified injury of neck, initial encounter: Secondary | ICD-10-CM | POA: Diagnosis not present

## 2019-02-22 DIAGNOSIS — S0101XA Laceration without foreign body of scalp, initial encounter: Secondary | ICD-10-CM | POA: Diagnosis not present

## 2019-02-22 DIAGNOSIS — R4182 Altered mental status, unspecified: Secondary | ICD-10-CM | POA: Diagnosis not present

## 2019-02-22 DIAGNOSIS — R4189 Other symptoms and signs involving cognitive functions and awareness: Secondary | ICD-10-CM | POA: Diagnosis not present

## 2019-02-22 DIAGNOSIS — F10929 Alcohol use, unspecified with intoxication, unspecified: Secondary | ICD-10-CM | POA: Diagnosis present

## 2019-02-22 DIAGNOSIS — M2550 Pain in unspecified joint: Secondary | ICD-10-CM | POA: Diagnosis not present

## 2019-02-22 DIAGNOSIS — R000 Tachycardia, unspecified: Secondary | ICD-10-CM | POA: Diagnosis not present

## 2019-02-22 DIAGNOSIS — S0101XD Laceration without foreign body of scalp, subsequent encounter: Secondary | ICD-10-CM | POA: Diagnosis not present

## 2019-02-22 DIAGNOSIS — R29818 Other symptoms and signs involving the nervous system: Secondary | ICD-10-CM | POA: Diagnosis not present

## 2019-02-22 DIAGNOSIS — Y92009 Unspecified place in unspecified non-institutional (private) residence as the place of occurrence of the external cause: Secondary | ICD-10-CM

## 2019-02-22 DIAGNOSIS — S8992XA Unspecified injury of left lower leg, initial encounter: Secondary | ICD-10-CM | POA: Diagnosis not present

## 2019-02-22 LAB — CBC WITH DIFFERENTIAL/PLATELET
Abs Immature Granulocytes: 0.09 10*3/uL — ABNORMAL HIGH (ref 0.00–0.07)
Basophils Absolute: 0.1 10*3/uL (ref 0.0–0.1)
Basophils Relative: 1 %
Eosinophils Absolute: 0 10*3/uL (ref 0.0–0.5)
Eosinophils Relative: 0 %
HCT: 34.7 % — ABNORMAL LOW (ref 39.0–52.0)
Hemoglobin: 11.9 g/dL — ABNORMAL LOW (ref 13.0–17.0)
Immature Granulocytes: 1 %
Lymphocytes Relative: 21 %
Lymphs Abs: 1.9 10*3/uL (ref 0.7–4.0)
MCH: 34.4 pg — ABNORMAL HIGH (ref 26.0–34.0)
MCHC: 34.3 g/dL (ref 30.0–36.0)
MCV: 100.3 fL — ABNORMAL HIGH (ref 80.0–100.0)
Monocytes Absolute: 0.5 10*3/uL (ref 0.1–1.0)
Monocytes Relative: 6 %
Neutro Abs: 6.6 10*3/uL (ref 1.7–7.7)
Neutrophils Relative %: 71 %
Platelets: 144 10*3/uL — ABNORMAL LOW (ref 150–400)
RBC: 3.46 MIL/uL — ABNORMAL LOW (ref 4.22–5.81)
RDW: 13 % (ref 11.5–15.5)
WBC: 9.1 10*3/uL (ref 4.0–10.5)
nRBC: 0 % (ref 0.0–0.2)

## 2019-02-22 LAB — COMPREHENSIVE METABOLIC PANEL
ALT: 34 U/L (ref 0–44)
AST: 47 U/L — ABNORMAL HIGH (ref 15–41)
Albumin: 3.2 g/dL — ABNORMAL LOW (ref 3.5–5.0)
Alkaline Phosphatase: 114 U/L (ref 38–126)
Anion gap: 19 — ABNORMAL HIGH (ref 5–15)
BUN: 5 mg/dL — ABNORMAL LOW (ref 8–23)
CO2: 15 mmol/L — ABNORMAL LOW (ref 22–32)
Calcium: 8.7 mg/dL — ABNORMAL LOW (ref 8.9–10.3)
Chloride: 98 mmol/L (ref 98–111)
Creatinine, Ser: 0.91 mg/dL (ref 0.61–1.24)
GFR calc Af Amer: >60 mL/min (ref >60)
GFR calc non Af Amer: >60 mL/min (ref >60)
Glucose, Bld: 171 mg/dL — ABNORMAL HIGH (ref 70–99)
Potassium: 4.1 mmol/L (ref 3.5–5.1)
Sodium: 132 mmol/L — ABNORMAL LOW (ref 135–145)
Total Bilirubin: 0.6 mg/dL (ref 0.3–1.2)
Total Protein: 6 g/dL — ABNORMAL LOW (ref 6.5–8.1)

## 2019-02-22 LAB — URINALYSIS, ROUTINE W REFLEX MICROSCOPIC
Bilirubin Urine: NEGATIVE
Glucose, UA: NEGATIVE mg/dL
Hgb urine dipstick: NEGATIVE
Ketones, ur: NEGATIVE mg/dL
Leukocytes,Ua: NEGATIVE
Nitrite: NEGATIVE
Protein, ur: NEGATIVE mg/dL
Specific Gravity, Urine: 1.005 (ref 1.005–1.030)
pH: 6 (ref 5.0–8.0)

## 2019-02-22 LAB — RAPID URINE DRUG SCREEN, HOSP PERFORMED
Amphetamines: NOT DETECTED
Barbiturates: NOT DETECTED
Benzodiazepines: NOT DETECTED
Cocaine: NOT DETECTED
Opiates: NOT DETECTED
Tetrahydrocannabinol: NOT DETECTED

## 2019-02-22 LAB — PROTIME-INR
INR: 1.1 (ref 0.8–1.2)
Prothrombin Time: 13.7 seconds (ref 11.4–15.2)

## 2019-02-22 LAB — MAGNESIUM: Magnesium: 1.8 mg/dL (ref 1.7–2.4)

## 2019-02-22 LAB — HIV ANTIBODY (ROUTINE TESTING W REFLEX): HIV Screen 4th Generation wRfx: NONREACTIVE

## 2019-02-22 LAB — VITAMIN B12: Vitamin B-12: 396 pg/mL (ref 180–914)

## 2019-02-22 LAB — SARS CORONAVIRUS 2 (TAT 6-24 HRS): SARS Coronavirus 2: NEGATIVE

## 2019-02-22 LAB — PHOSPHORUS: Phosphorus: 4.3 mg/dL (ref 2.5–4.6)

## 2019-02-22 LAB — ETHANOL: Alcohol, Ethyl (B): 177 mg/dL — ABNORMAL HIGH (ref <10)

## 2019-02-22 MED ORDER — THIAMINE HCL 100 MG/ML IJ SOLN
100.0000 mg | Freq: Every day | INTRAMUSCULAR | Status: DC
  Administered 2019-02-22 – 2019-02-27 (×6): 100 mg via INTRAVENOUS
  Filled 2019-02-22 (×6): qty 2

## 2019-02-22 MED ORDER — MORPHINE SULFATE (PF) 4 MG/ML IV SOLN
4.0000 mg | Freq: Once | INTRAVENOUS | Status: AC
  Administered 2019-02-22: 4 mg via INTRAVENOUS
  Filled 2019-02-22: qty 1

## 2019-02-22 MED ORDER — LORAZEPAM 2 MG/ML IJ SOLN
1.0000 mg | INTRAMUSCULAR | Status: DC | PRN
  Administered 2019-02-22 (×2): 2 mg via INTRAVENOUS
  Administered 2019-02-23: 1 mg via INTRAVENOUS
  Administered 2019-02-23: 2 mg via INTRAVENOUS
  Administered 2019-02-23 (×2): 1 mg via INTRAVENOUS
  Administered 2019-02-23: 2 mg via INTRAVENOUS
  Administered 2019-02-23: 1 mg via INTRAVENOUS
  Administered 2019-02-23: 2 mg via INTRAVENOUS
  Administered 2019-02-23: 1 mg via INTRAVENOUS
  Administered 2019-02-23: 2 mg via INTRAVENOUS
  Administered 2019-02-23 (×2): 1 mg via INTRAVENOUS
  Administered 2019-02-23: 3 mg via INTRAVENOUS
  Administered 2019-02-24 (×2): 1 mg via INTRAVENOUS
  Administered 2019-02-24: 3 mg via INTRAVENOUS
  Administered 2019-02-24: 2 mg via INTRAVENOUS
  Filled 2019-02-22: qty 2
  Filled 2019-02-22 (×6): qty 1
  Filled 2019-02-22: qty 2
  Filled 2019-02-22 (×10): qty 1

## 2019-02-22 MED ORDER — THIAMINE HCL 100 MG/ML IJ SOLN
Freq: Once | INTRAVENOUS | Status: AC
  Filled 2019-02-22: qty 1000

## 2019-02-22 MED ORDER — ADULT MULTIVITAMIN W/MINERALS CH
1.0000 | ORAL_TABLET | Freq: Every day | ORAL | Status: DC
  Administered 2019-02-26 – 2019-03-03 (×6): 1 via ORAL
  Filled 2019-02-22 (×7): qty 1

## 2019-02-22 MED ORDER — LIDOCAINE-EPINEPHRINE (PF) 2 %-1:200000 IJ SOLN
20.0000 mL | Freq: Once | INTRAMUSCULAR | Status: AC
  Administered 2019-02-22: 20 mL
  Filled 2019-02-22: qty 20

## 2019-02-22 MED ORDER — ONDANSETRON HCL 4 MG PO TABS: 4.0000 mg | ORAL_TABLET | Freq: Four times a day (QID) | ORAL | Status: DC | PRN

## 2019-02-22 MED ORDER — ONDANSETRON HCL 4 MG/2ML IJ SOLN: 4.0000 mg | Freq: Four times a day (QID) | INTRAMUSCULAR | Status: DC | PRN

## 2019-02-22 MED ORDER — SODIUM CHLORIDE 0.9 % IV BOLUS
1000.0000 mL | Freq: Once | INTRAVENOUS | Status: AC
  Administered 2019-02-22: 1000 mL via INTRAVENOUS

## 2019-02-22 MED ORDER — FOLIC ACID 5 MG/ML IJ SOLN
1.0000 mg | Freq: Every day | INTRAMUSCULAR | Status: DC
  Administered 2019-02-22 – 2019-02-27 (×6): 1 mg via INTRAVENOUS
  Filled 2019-02-22 (×8): qty 0.2

## 2019-02-22 MED ORDER — LORAZEPAM 1 MG PO TABS: 1.0000 mg | ORAL_TABLET | ORAL | Status: DC | PRN

## 2019-02-22 MED ORDER — ACETAMINOPHEN 650 MG RE SUPP: 650.0000 mg | Freq: Four times a day (QID) | RECTAL | Status: DC | PRN

## 2019-02-22 MED ORDER — ACETAMINOPHEN 325 MG PO TABS
650.0000 mg | ORAL_TABLET | Freq: Four times a day (QID) | ORAL | Status: DC | PRN
  Administered 2019-02-27 – 2019-03-03 (×6): 650 mg via ORAL
  Filled 2019-02-22 (×6): qty 2

## 2019-02-22 MED ORDER — TETANUS-DIPHTH-ACELL PERTUSSIS 5-2.5-18.5 LF-MCG/0.5 IM SUSP
0.5000 mL | Freq: Once | INTRAMUSCULAR | Status: AC
  Administered 2019-02-22: 0.5 mL via INTRAMUSCULAR
  Filled 2019-02-22: qty 0.5

## 2019-02-22 NOTE — ED Notes (Addendum)
This RN and Tech attempted to ambulate pt.  Patient too unsteady and wobbly to walk.   Stood beside bed with 2 person assist for approx. 60 seconds.

## 2019-02-22 NOTE — ED Notes (Signed)
Patient transported to CT 

## 2019-02-22 NOTE — H&P (Signed)
History and Physical    Edwin Torres [id number] DOB: [birth year] DOA: 02/22/2019  PCP: Patient, No Pcp Per  Patient coming from: Home  I have personally briefly reviewed patient's old medical records in [place]  Chief Complaint: Alcohol intoxication and frequent falls  HPI: Edwin Torres is a 63 y.o. male with medical history significant of alcohol abuse, tobacco abuse brought by EMS and [facility] to emergency department for evaluation of recent fall.  Patient has history of alcohol abuse and frequent falls and this morning he fell 3 hours prior to arrival to emergency department and had large laceration to his left side of head.  Patient refused to come to the hospital so his wife called police and brought patient to the emergency department for further evaluation and management.  Patient tells me that he has fallen several times denies association with headache, blurry vision, lightheadedness, dizziness, chest pain, shortness of breath, palpitation, leg swelling, nausea, vomiting, decreased appetite, generalized weakness or lethargy.  He tells me that he drinks 4-5 beers per day for many years, smokes 1 pack of cigarettes per day, denies use of illicit drugs.  He denies history of depression, suicidal or homicidal thoughts.    I called patient's wife who reported that patient has been depressed and has very limited appetite.  She tells me that patient drinks alcohol all day long for many years.  He does not take any medications at home except over-the-counter ibuprofen.  No history of diabetes, hypertension, hyperlipidemia, coronary artery disease.  He have not seen his PCP in many years.  Frequent ED visits with similar symptoms in 2019 and 2018.  No history of alcohol withdrawal seizure or ICU admission due to alcohol withdrawal in the past.  ED Course: Upon arrival: Patient tachycardic, maintaining oxygen saturation on room air, ethanol level: 177, CBC  shows macrocytic anemia.  UA negative, UDS negative, CT head and cervical spine negative.  Left parietal scalp laceration-repaired in the ED. he received IV fluid bolus, banana bag and Tdap shot in the ED.  Review of Systems: As per HPI otherwise negative.    Past Medical History:  Diagnosis Date  . Alcoholic ([place])   . Seizures ([place])     Past Surgical History:  Procedure Laterality Date  . VASECTOMY       reports that he has been smoking. He uses smokeless tobacco. He reports current alcohol use of about 8.0 standard drinks of alcohol per week. He reports that he does not use drugs.  Allergies  Allergen Reactions  . Penicillins Anaphylaxis    Has patient had a PCN reaction causing immediate rash, facial/tongue/throat swelling, SOB or lightheadedness with hypotension: Yes Has patient had a PCN reaction causing severe rash involving mucus membranes or skin necrosis: No Has patient had a PCN reaction that required hospitalization: Yes Has patient had a PCN reaction occurring within the last 10 years: No If all of the above answers are "NO", then may proceed with Cephalosporin use.   . Ativan [Lorazepam] Other (See Comments)    Made him crazy    Family History  Problem Relation Age of Onset  . Cancer Mother   . Diabetes Mother   . Hyperlipidemia Father     Prior to Admission medications   Not on File    Physical Exam: Vitals:   02/22/19 1315 02/22/19 1330 02/22/19 1400 02/22/19 1404  BP: (!) 145/84 136/90 108/71   Pulse: (!) 111 (!) 123  (!) 115  Resp: 14 15 11 16   Temp:      TempSrc:      SpO2: 100% 98%  100%  Weight:      Height:        Constitutional: NAD, calm, comfortable, dehydrated, shaking, have tremors in his legs and hands.  On room air. Eyes: PERRL, lids and conjunctivae normal ENMT: Mucous membranes are dry. Posterior pharynx clear of any exudate or lesions.Normal dentition.  Neck: normal, supple, no masses, no thyromegaly Respiratory: clear to  auscultation bilaterally, no wheezing, no crackles. Normal respiratory effort. No accessory muscle use.  Cardiovascular: Regular rate and rhythm, no murmurs / rubs / gallops. No extremity edema. 2+ pedal pulses. No carotid bruits.  Abdomen: no tenderness, no masses palpated. No hepatosplenomegaly. Bowel sounds positive.  Musculoskeletal: no clubbing / cyanosis. No joint deformity upper and lower extremities. Good ROM, no contractures. Normal muscle tone.  Skin: Multiple bruises noted on arms and legs. Neurologic: CN 2-12 grossly intact. Sensation intact, DTR normal. Strength 5/5 in all 4.  Psychiatric: Normal judgment and insight. Alert and oriented x 3. Normal mood.    Labs on Admission: I have personally reviewed following labs and imaging studies  CBC: Recent Labs  Lab 02/22/19 0641  WBC 9.1  NEUTROABS 6.6  HGB 11.9*  HCT 34.7*  MCV 100.3*  PLT 123456*   Basic Metabolic Panel: Recent Labs  Lab 02/22/19 0641  NA 132*  K 4.1  CL 98  CO2 15*  GLUCOSE 171*  BUN 5*  CREATININE 0.91  CALCIUM 8.7*   GFR: Estimated Creatinine Clearance: 68.7 mL/min (by C-G formula based on SCr of 0.91 mg/dL). Liver Function Tests: Recent Labs  Lab 02/22/19 0641  AST 47*  ALT 34  ALKPHOS 114  BILITOT 0.6  PROT 6.0*  ALBUMIN 3.2*   No results for input(s): LIPASE, AMYLASE in the last 168 hours. No results for input(s): AMMONIA in the last 168 hours. Coagulation Profile: No results for input(s): INR, PROTIME in the last 168 hours. Cardiac Enzymes: No results for input(s): CKTOTAL, CKMB, CKMBINDEX, TROPONINI in the last 168 hours. BNP (last 3 results) No results for input(s): PROBNP in the last 8760 hours. HbA1C: No results for input(s): HGBA1C in the last 72 hours. CBG: No results for input(s): GLUCAP in the last 168 hours. Lipid Profile: No results for input(s): CHOL, HDL, LDLCALC, TRIG, CHOLHDL, LDLDIRECT in the last 72 hours. Thyroid Function Tests: No results for input(s): TSH,  T4TOTAL, FREET4, T3FREE, THYROIDAB in the last 72 hours. Anemia Panel: No results for input(s): VITAMINB12, FOLATE, FERRITIN, TIBC, IRON, RETICCTPCT in the last 72 hours. Urine analysis:    Component Value Date/Time   COLORURINE YELLOW 02/22/2019 1039   APPEARANCEUR HAZY (A) 02/22/2019 1039   LABSPEC 1.005 02/22/2019 1039   PHURINE 6.0 02/22/2019 1039   GLUCOSEU NEGATIVE 02/22/2019 1039   HGBUR NEGATIVE 02/22/2019 1039   BILIRUBINUR NEGATIVE 02/22/2019 1039   KETONESUR NEGATIVE 02/22/2019 1039   PROTEINUR NEGATIVE 02/22/2019 1039   NITRITE NEGATIVE 02/22/2019 1039   LEUKOCYTESUR NEGATIVE 02/22/2019 1039    Radiological Exams on Admission: CT Head Wo Contrast  Result Date: 02/22/2019 CLINICAL DATA:  Head trauma, minor, normal mental status. Additional provided: Fall EXAM: CT HEAD WITHOUT CONTRAST CT CERVICAL SPINE WITHOUT CONTRAST TECHNIQUE: Multidetector CT imaging of the head and cervical spine was performed following the standard protocol without intravenous contrast. Multiplanar CT image reconstructions of the cervical spine were also generated. COMPARISON:  Head CT 07/16/2017, CT cervical spine 06/22/2017 qa  FINDINGS: CT HEAD FINDINGS Brain: No evidence of acute intracranial hemorrhage. No demarcated cortical infarction. No midline shift or extra-axial fluid collection. Age advanced generalized parenchymal atrophy. Ill-defined hypoattenuation within the cerebral white matter is nonspecific, but consistent with chronic small vessel ischemic disease. Redemonstrated chronic left basal ganglia lacunar infarct. Unchanged 10 mm extra-axial calcified focus along the left frontal convexity, which may reflect a small incidentally calcified meningioma. Vascular: No hyperdense vessel.  Atherosclerotic calcifications. Skull: Normal. Negative for fracture or focal lesion. Sinuses/Orbits: Visualized orbits demonstrate no acute abnormality. No significant paranasal sinus disease at the imaged levels.  Small bilateral mastoid effusions. Other: Left-sided scalp hematoma/laceration. CT CERVICAL SPINE FINDINGS Alignment: Partially visualized thoracic levocurvature with cervicothoracic dextrocurvature. No significant spondylolisthesis. Skull base and vertebrae: The basion-dental and atlanto-dental intervals are maintained.No evidence of acute fracture to the cervical spine. Soft tissues and spinal canal: No prevertebral fluid or swelling. No visible canal hematoma. Carotid artery atherosclerotic disease Disc levels: Cervical spondylosis with multilevel posterior disc osteophytes, uncovertebral and facet hypertrophy. Disc height loss is severe at the C6-C7 through T1-T2 levels. Multilevel ventral osteophytes. Partial degenerative fusion of the posterior elements at C2-C3. Upper chest: No consolidation within the imaged lung apices. No visible pneumothorax. IMPRESSION: CT head: 1. No evidence of acute intracranial abnormality. Left-sided scalp hematoma/laceration. 2. Age advanced generalized parenchymal atrophy with chronic small vessel ischemic disease. 3. Unchanged 10 mm extra-axial calcified focus along the left frontal convexity, possibly reflecting a small incidental meningioma. 4. Small bilateral mastoid effusions. CT cervical spine: 1. No evidence of acute fracture to the cervical spine. 2. Cervical spondylosis as described. Electronically Signed   By: Kellie Simmering DO   On: 02/22/2019 07:57   CT Cervical Spine Wo Contrast  Result Date: 02/22/2019 CLINICAL DATA:  Head trauma, minor, normal mental status. Additional provided: Fall EXAM: CT HEAD WITHOUT CONTRAST CT CERVICAL SPINE WITHOUT CONTRAST TECHNIQUE: Multidetector CT imaging of the head and cervical spine was performed following the standard protocol without intravenous contrast. Multiplanar CT image reconstructions of the cervical spine were also generated. COMPARISON:  Head CT 07/16/2017, CT cervical spine 06/22/2017 qa FINDINGS: CT HEAD FINDINGS  Brain: No evidence of acute intracranial hemorrhage. No demarcated cortical infarction. No midline shift or extra-axial fluid collection. Age advanced generalized parenchymal atrophy. Ill-defined hypoattenuation within the cerebral white matter is nonspecific, but consistent with chronic small vessel ischemic disease. Redemonstrated chronic left basal ganglia lacunar infarct. Unchanged 10 mm extra-axial calcified focus along the left frontal convexity, which may reflect a small incidentally calcified meningioma. Vascular: No hyperdense vessel.  Atherosclerotic calcifications. Skull: Normal. Negative for fracture or focal lesion. Sinuses/Orbits: Visualized orbits demonstrate no acute abnormality. No significant paranasal sinus disease at the imaged levels. Small bilateral mastoid effusions. Other: Left-sided scalp hematoma/laceration. CT CERVICAL SPINE FINDINGS Alignment: Partially visualized thoracic levocurvature with cervicothoracic dextrocurvature. No significant spondylolisthesis. Skull base and vertebrae: The basion-dental and atlanto-dental intervals are maintained.No evidence of acute fracture to the cervical spine. Soft tissues and spinal canal: No prevertebral fluid or swelling. No visible canal hematoma. Carotid artery atherosclerotic disease Disc levels: Cervical spondylosis with multilevel posterior disc osteophytes, uncovertebral and facet hypertrophy. Disc height loss is severe at the C6-C7 through T1-T2 levels. Multilevel ventral osteophytes. Partial degenerative fusion of the posterior elements at C2-C3. Upper chest: No consolidation within the imaged lung apices. No visible pneumothorax. IMPRESSION: CT head: 1. No evidence of acute intracranial abnormality. Left-sided scalp hematoma/laceration. 2. Age advanced generalized parenchymal atrophy with chronic small vessel ischemic disease. 3. Unchanged  10 mm extra-axial calcified focus along the left frontal convexity, possibly reflecting a small  incidental meningioma. 4. Small bilateral mastoid effusions. CT cervical spine: 1. No evidence of acute fracture to the cervical spine. 2. Cervical spondylosis as described. Electronically Signed   By: Kellie Simmering DO   On: 02/22/2019 07:57    EKG: Normal sinus rhythm, borderline prolonged QT interval, no ST elevation or depression noted.  Assessment/Plan Principal Problem:   Frequent falls Active Problems:   Alcohol abuse with intoxication (HCC)   Increased anion gap metabolic acidosis   Macrocytic anemia   Tobacco abuse   Physical deconditioning   Alcohol intoxication ([place])   Thrombocytopenia ([place])   Scalp laceration   Frequent Falls 2/2 to Alcohol intoxication: -Patient drinks 4-5 beverages of alcohol per day for many years.  Ethanol level: 177. -CT head without contrast and CT cervical spine: Negative for acute findings.  UA and UDS: Negative. -Admit patient to stepdown unit for close monitoring.  On telemetry. -Started on CIWA protocol. -Received banana bag x1 in ED.  Started on IV thiamine and folic acid. -Frequent neuro checks. -On seizure and aspiration precautions. -We will keep him n.p.o. until he passes the bedside swallow evaluation. -Consulted PT/ST/OT  Increased anion gap metabolic acidosis: Anion gap of 19 -Likely secondary to alcohol -Continue to monitor.  Macrocytic anemia: -Secondary to alcohol abuse. -Check folate, B12 and TSH level  Thrombocytopenia: Platelet count is 144 -Likely secondary from alcohol abuse -Monitor platelet count and signs of bleeding.  Scalp laceration: -On left side.  Repaired in ED -Tylenol as needed for pain control.  Physical deconditioning/poor appetite: -Consulted dietitian  DVT prophylaxis: TED/SCD Code Status: Full code Family Communication: None present at bedside.  Plan of care discussed with patient in length and he verbalized understanding and agreed with it. Talked to his wife Ms. [name] & discussed about the  patient & she verbalized understanding. Disposition Plan: TBD Consults called: None Admission status: Inpatient at [place] MD [facility] Pager [phone]  If 7PM-7AM, please contact night-coverage [email or web address] Password [email or web address]  02/22/2019, 3:11 PM

## 2019-02-22 NOTE — ED Notes (Signed)
Sitter at bedside.

## 2019-02-22 NOTE — ED Provider Notes (Signed)
[facility] EMERGENCY DEPARTMENT Provider Note   CSN: [id number] Arrival date & time: 02/22/19  [id number]     History Chief Complaint  Patient presents with  . Fall  . IVC    Edwin Torres is a 63 y.o. male.  The history is provided by the patient and the police. No language interpreter was used.  Fall       63 year old male with history of alcohol abuse, alcohol-related seizure, brought here via EMS from home for evaluation of a recent fall.  Patient accompanied by Edwin Torres.  [employer] received a call from patient's home from wife.  Patient has history of alcohol abuse, has had recurrent falls, this morning he fell approximately 3 hours ago and suffered a large laceration to his head with copious amount of bleeding.  Patient however refused hospital care and due to the concern, lifestyle IVC and patient was brought here for further evaluation.  He was initially combative, requiring handcuffs but now reported much more calm.  Additional information obtained from patient.  Patient reported he has difficulty sleeping last night, and believes he may have tripped on something and fell.  He does not report any loss of consciousness.  Denies having any significant pain.  Denies headache, neck pain, chest pain, abdominal pain, back pain or pain to his extremities.  He denies any weakness or numbness.  He admits that he has fallen several times most recent fall was approximately a week ago.  He admits to consuming approximately 4-6 alcoholic beverage daily, last was last night.  He denies any SI or HI.  Is unable to recall last tetanus status.  He denies taking any medication regular basis.  No report of nausea vomiting or diarrhea.  Past Medical History:  Diagnosis Date  . Alcoholic ([place])   . Seizures [facility])     Patient Active Problem List   Diagnosis Date Noted  . Visit for suture removal 08/20/2016  . Weakness of right third cranial nerve 03/21/2016     Past Surgical History:  Procedure Laterality Date  . VASECTOMY         Family History  Problem Relation Age of Onset  . Cancer Mother   . Diabetes Mother   . Hyperlipidemia Father     Social History   Tobacco Use  . Smoking status: Current Every Day Smoker  . Smokeless tobacco: Current User  Substance Use Topics  . Alcohol use: Yes    Alcohol/week: 8.0 standard drinks    Types: 8 Cans of beer per week    Comment: daily  . Drug use: No    Home Medications Prior to Admission medications   Medication Sig Start Date End Date Taking? Authorizing Provider  ibuprofen (ADVIL,MOTRIN) 200 MG tablet Take 600 mg by mouth every 6 (six) hours as needed for moderate pain.    [provider]    Allergies    Penicillins and Ativan [lorazepam]  Review of Systems   Review of Systems  All other systems reviewed and are negative.   Physical Exam Updated Vital Signs BP (!) 96/55 (BP Location: Right Arm)   Pulse (!) 102   Resp 15   Ht 5\' 4"  (1.626 m)   Wt 63.5 kg   SpO2 97%   BMI 24.03 kg/m   Physical Exam Vitals and nursing note reviewed.  Constitutional:      General: He is not in acute distress.    Appearance: He is well-developed.  Comments: Edwin Torres male, appears tremulous but in no acute discomfort.  HENT:     Head: Normocephalic.     Comments: Scalp: 12 centimeter crescent-shaped laceration noted to left parietal region not actively bleeding but tender to palpation.  No crepitus.  No hemotympanum, no septal hematoma, no malocclusion, no midface tenderness. Eyes:     Extraocular Movements: Extraocular movements intact.     Conjunctiva/sclera: Conjunctivae normal.     Pupils: Pupils are equal, round, and reactive to light.  Neck:     Comments: No cervical midline spine tenderness Cardiovascular:     Rate and Rhythm: Tachycardia present.     Pulses: Normal pulses.     Heart sounds: Normal heart sounds.  Pulmonary:     Effort: Pulmonary  effort is normal.     Breath sounds: Normal breath sounds.  Abdominal:     Palpations: Abdomen is soft.     Tenderness: There is no abdominal tenderness.     Comments: Small lipoma noted to right abdominal wall nontender.  Musculoskeletal:     Cervical back: Normal range of motion and neck supple.     Comments: No significant midline spine tenderness crepitus or step-off.  Skin:    Findings: No rash.     Comments: Skin tear noted to bilateral wrist from recent encounters.  Minimal tenderness to palpation no deformity noted.  Neurological:     Mental Status: He is alert and oriented to person, place, and time.     GCS: GCS eye subscore is 4. GCS verbal subscore is 5. GCS motor subscore is 6.     Cranial Nerves: No dysarthria.     Motor: Tremor present.     ED Results / Procedures / Treatments   Labs (all labs ordered are listed, but only abnormal results are displayed) Labs Reviewed  ETHANOL - Abnormal; Notable for the following components:      Result Value   Alcohol, Ethyl (B) 177 (*)    All other components within normal limits  COMPREHENSIVE METABOLIC PANEL - Abnormal; Notable for the following components:   Sodium 132 (*)    CO2 15 (*)    Glucose, Bld 171 (*)    BUN 5 (*)    Calcium 8.7 (*)    Total Protein 6.0 (*)    Albumin 3.2 (*)    AST 47 (*)    Anion gap 19 (*)    All other components within normal limits  CBC WITH DIFFERENTIAL/PLATELET - Abnormal; Notable for the following components:   RBC 3.46 (*)    Hemoglobin 11.9 (*)    HCT 34.7 (*)    MCV 100.3 (*)    MCH 34.4 (*)    Platelets 144 (*)    Abs Immature Granulocytes 0.09 (*)    All other components within normal limits  URINALYSIS, ROUTINE W REFLEX MICROSCOPIC - Abnormal; Notable for the following components:   APPearance HAZY (*)    All other components within normal limits  RAPID URINE DRUG SCREEN, HOSP PERFORMED  MAGNESIUM  PHOSPHORUS    EKG EKG Interpretation  Date/Time:  Tuesday February 22 2019 06:54:43 EST Ventricular Rate:  93 PR Interval:    QRS Duration: 111 QT Interval:  390 QTC Calculation: 486 R Axis:   71 Text Interpretation: Sinus rhythm Borderline prolonged QT interval No significant change since last tracing Confirmed by Wandra Arthurs [phone]) on 02/22/2019 7:06:15 AM   Radiology CT Head Wo Contrast  Result Date: 02/22/2019 CLINICAL DATA:  Head trauma, minor, normal mental status. Additional provided: Fall EXAM: CT HEAD WITHOUT CONTRAST CT CERVICAL SPINE WITHOUT CONTRAST TECHNIQUE: Multidetector CT imaging of the head and cervical spine was performed following the standard protocol without intravenous contrast. Multiplanar CT image reconstructions of the cervical spine were also generated. COMPARISON:  Head CT 07/16/2017, CT cervical spine 06/22/2017 qa FINDINGS: CT HEAD FINDINGS Brain: No evidence of acute intracranial hemorrhage. No demarcated cortical infarction. No midline shift or extra-axial fluid collection. Age advanced generalized parenchymal atrophy. Ill-defined hypoattenuation within the cerebral white matter is nonspecific, but consistent with chronic small vessel ischemic disease. Redemonstrated chronic left basal ganglia lacunar infarct. Unchanged 10 mm extra-axial calcified focus along the left frontal convexity, which may reflect a small incidentally calcified meningioma. Vascular: No hyperdense vessel.  Atherosclerotic calcifications. Skull: Normal. Negative for fracture or focal lesion. Sinuses/Orbits: Visualized orbits demonstrate no acute abnormality. No significant paranasal sinus disease at the imaged levels. Small bilateral mastoid effusions. Other: Left-sided scalp hematoma/laceration. CT CERVICAL SPINE FINDINGS Alignment: Partially visualized thoracic levocurvature with cervicothoracic dextrocurvature. No significant spondylolisthesis. Skull base and vertebrae: The basion-dental and atlanto-dental intervals are maintained.No evidence of acute fracture  to the cervical spine. Soft tissues and spinal canal: No prevertebral fluid or swelling. No visible canal hematoma. Carotid artery atherosclerotic disease Disc levels: Cervical spondylosis with multilevel posterior disc osteophytes, uncovertebral and facet hypertrophy. Disc height loss is severe at the C6-C7 through T1-T2 levels. Multilevel ventral osteophytes. Partial degenerative fusion of the posterior elements at C2-C3. Upper chest: No consolidation within the imaged lung apices. No visible pneumothorax. IMPRESSION: CT head: 1. No evidence of acute intracranial abnormality. Left-sided scalp hematoma/laceration. 2. Age advanced generalized parenchymal atrophy with chronic small vessel ischemic disease. 3. Unchanged 10 mm extra-axial calcified focus along the left frontal convexity, possibly reflecting a small incidental meningioma. 4. Small bilateral mastoid effusions. CT cervical spine: 1. No evidence of acute fracture to the cervical spine. 2. Cervical spondylosis as described. Electronically Signed   By: Edwin Torres   On: 02/22/2019 07:57   CT Cervical Spine Wo Contrast  Result Date: 02/22/2019 CLINICAL DATA:  Head trauma, minor, normal mental status. Additional provided: Fall EXAM: CT HEAD WITHOUT CONTRAST CT CERVICAL SPINE WITHOUT CONTRAST TECHNIQUE: Multidetector CT imaging of the head and cervical spine was performed following the standard protocol without intravenous contrast. Multiplanar CT image reconstructions of the cervical spine were also generated. COMPARISON:  Head CT 07/16/2017, CT cervical spine 06/22/2017 qa FINDINGS: CT HEAD FINDINGS Brain: No evidence of acute intracranial hemorrhage. No demarcated cortical infarction. No midline shift or extra-axial fluid collection. Age advanced generalized parenchymal atrophy. Ill-defined hypoattenuation within the cerebral white matter is nonspecific, but consistent with chronic small vessel ischemic disease. Redemonstrated chronic left basal  ganglia lacunar infarct. Unchanged 10 mm extra-axial calcified focus along the left frontal convexity, which may reflect a small incidentally calcified meningioma. Vascular: No hyperdense vessel.  Atherosclerotic calcifications. Skull: Normal. Negative for fracture or focal lesion. Sinuses/Orbits: Visualized orbits demonstrate no acute abnormality. No significant paranasal sinus disease at the imaged levels. Small bilateral mastoid effusions. Other: Left-sided scalp hematoma/laceration. CT CERVICAL SPINE FINDINGS Alignment: Partially visualized thoracic levocurvature with cervicothoracic dextrocurvature. No significant spondylolisthesis. Skull base and vertebrae: The basion-dental and atlanto-dental intervals are maintained.No evidence of acute fracture to the cervical spine. Soft tissues and spinal canal: No prevertebral fluid or swelling. No visible canal hematoma. Carotid artery atherosclerotic disease Disc levels: Cervical spondylosis with multilevel posterior disc osteophytes, uncovertebral and facet hypertrophy. Disc height loss  is severe at the C6-C7 through T1-T2 levels. Multilevel ventral osteophytes. Partial degenerative fusion of the posterior elements at C2-C3. Upper chest: No consolidation within the imaged lung apices. No visible pneumothorax. IMPRESSION: CT head: 1. No evidence of acute intracranial abnormality. Left-sided scalp hematoma/laceration. 2. Age advanced generalized parenchymal atrophy with chronic small vessel ischemic disease. 3. Unchanged 10 mm extra-axial calcified focus along the left frontal convexity, possibly reflecting a small incidental meningioma. 4. Small bilateral mastoid effusions. CT cervical spine: 1. No evidence of acute fracture to the cervical spine. 2. Cervical spondylosis as described. Electronically Signed   By: Edwin Torres   On: 02/22/2019 07:57    Procedures .Marland KitchenLaceration Repair  Date/Time: 02/22/2019 8:30 AM Performed by: Edwin Moras, Edwin Torres Authorized by:  Edwin Moras, Edwin Torres   Consent:    Consent obtained:  Verbal   Consent given by:  Patient   Risks discussed:  Infection, need for additional repair, pain, poor cosmetic result and poor wound healing   Alternatives discussed:  No treatment and delayed treatment Universal protocol:    Procedure explained and questions answered to patient or proxy's satisfaction: yes     Relevant documents present and verified: yes     Test results available and properly labeled: yes     Imaging studies available: yes     Required blood products, implants, devices, and special equipment available: yes     Site/side marked: yes     Immediately prior to procedure, a time out was called: yes     Patient identity confirmed:  Verbally with patient Anesthesia (see MAR for exact dosages):    Anesthesia method:  Local infiltration   Local anesthetic:  Lidocaine 2% WITH epi Laceration details:    Location:  Scalp   Scalp location:  L parietal   Length (cm):  12   Depth (mm):  10 Repair type:    Repair type:  Complex Pre-procedure details:    Preparation:  Patient was prepped and draped in usual sterile fashion and imaging obtained to evaluate for foreign bodies Exploration:    Limited defect created (wound extended): no     Hemostasis achieved with:  Direct pressure   Wound exploration: wound explored through full range of motion and entire depth of wound probed and visualized     Wound extent: no foreign bodies/material noted and no underlying fracture noted     Contaminated: no   Treatment:    Area cleansed with:  Betadine   Amount of cleaning:  Standard   Irrigation solution:  Sterile saline   Irrigation method:  Pressure wash   Visualized foreign bodies/material removed: no     Debridement:  Minimal   Undermining:  Minimal   Scar revision: no   Skin repair:    Repair method:  Staples   Number of staples:  12 Approximation:    Approximation:  Close Post-procedure details:    Dressing:   Non-adherent dressing   Patient tolerance of procedure:  Tolerated well, no immediate complications  .Critical Care Performed by: Edwin Moras, Edwin Torres Authorized by: Edwin Moras, Edwin Torres   Critical care provider statement:    Critical care time (minutes):  45   Critical care was time spent personally by me on the following activities:  Discussions with consultants, evaluation of patient's response to treatment, examination of patient, ordering and performing treatments and interventions, ordering and review of laboratory studies, ordering and review of radiographic studies, pulse oximetry, re-evaluation of patient's condition, obtaining history from patient or  surrogate and review of old charts   (including critical care time)  Medications Ordered in ED Medications  sodium chloride 0.9 % 1,000 mL with thiamine 123XX123 mg, folic acid 1 mg, multivitamins adult 10 mL infusion ( Intravenous New Bag/Given 02/22/19 0748)  Tdap (BOOSTRIX) injection 0.5 mL (0.5 mLs Intramuscular Given 02/22/19 0739)  lidocaine-EPINEPHrine (XYLOCAINE W/EPI) 2 %-1:200000 (PF) injection 20 mL (20 mLs Infiltration Given by Other 02/22/19 0744)  sodium chloride 0.9 % bolus 1,000 mL (0 mLs Intravenous Stopped 02/22/19 1007)  morphine 4 MG/ML injection 4 mg (4 mg Intravenous Given 02/22/19 1120)    ED Course  I have reviewed the triage vital signs and the nursing notes.  Pertinent labs & imaging results that were available during my care of the patient were reviewed by me and considered in my medical decision making (see chart for details).    MDM Rules/Calculators/A&P                      BP 105/73   Pulse 96   Temp 97.8 F (36.6 C) (Oral)   Resp 11   Ht 5\' 4"  (1.626 m)   Wt 63.5 kg   SpO2 100%   BMI 24.03 kg/m   Final Clinical Impression(s) / ED Diagnoses Final diagnoses:  Fall in home, initial encounter  Laceration of scalp, initial encounter  Alcoholic intoxication with complication ([place])  Alcoholic  ketoacidosis    Rx / DC Orders ED Discharge Orders    None     6:51 AM Patient with significant history of alcohol abuse here due to a fall.  He has an apparent scalp laceration to his left parietal region amenable for laceration repair.  He appears tremulous but able to move all 4 extremities and able to answer questions appropriately.  He has skin tears to both wrist without any deep laceration or bony injury.  No other obvious injury noted on exam.  Will update tetanus status.  Will obtain head and cervical spine CT, and will perform scalp laceration.  Patient initially brought here via IVC due to refusal to come to the ER however at this time he is calm and cooperative.  8:31 AM General centimeter laceration noted to left parietal region.  Wound was thoroughly irrigated, head trimmed, subsequently surgical staple applied.  Patient tolerates well.  Initial head and cervical spine CT without any intracranial injury or skull fracture.  Alcohol level is 177.  Labs otherwise reassuring.  Patient given a banana bag which include thiamine and vitamin B6, update tetanus, and laceration repair.  I did contact pt's wife via phone to give an update.  Plan to hydrate pt and monitor until he is clinically sober and his wife will pick him up.    2:33 PM Attempt to have patient ambulate however he is very unsteady on his feet and at high risk for falling.  He was unable to ambulate.  Lab is concerning for an elevated anion gap of 19, bicarb of 15 concerning for alcoholic ketoacidosis is exhibiting tremors concerning for alcohol withdrawal.  Patient have received 2 L of fluid while still remain symptomatic.  Will place on CIWA protocol and will consult for admission as patient is likely at high risk for DT as well as recurrent falls.  2:58 PM Appreciate consultation from [facility] hospitalist, Dr. Royston Cowper who agrees to see and admit patient for further management of his alcoholic ketoacidosis   [place] E Patrie  was evaluated in Emergency Department on  02/22/2019 for the symptoms described in the history of present illness. He was evaluated in the context of the global COVID-19 pandemic, which necessitated consideration that the patient might be at risk for infection with the SARS-CoV-2 virus that causes COVID-19. Institutional protocols and algorithms that pertain to the evaluation of patients at risk for COVID-19 are in a state of rapid change based on information released by regulatory bodies including the CDC and federal and state organizations. These policies and algorithms were followed during the patient's care in the ED.    Edwin Moras, Edwin Torres 02/22/19 1459    Mesner, Corene Cornea, MD 02/22/19 2257

## 2019-02-22 NOTE — ED Triage Notes (Addendum)
Pt presents to ED from home BIB [facility]. Pt had fall 4h ago hit head on counter top. No blood thinners, no LOC. Per pt he tripped and fell. Pt had 6-8 inch lac on L side head. Pt AAO x4. Pt refused to come to hosp wife IVC. Pt reports 4-5 cans of beer tonight. Pupils PERRLA. Per wife pt has frequent falls. EMS VSS. [employer] at bedside.

## 2019-02-23 LAB — COMPREHENSIVE METABOLIC PANEL
ALT: 26 U/L (ref 0–44)
AST: 44 U/L — ABNORMAL HIGH (ref 15–41)
Albumin: 2.9 g/dL — ABNORMAL LOW (ref 3.5–5.0)
Alkaline Phosphatase: 99 U/L (ref 38–126)
Anion gap: 8 (ref 5–15)
BUN: 9 mg/dL (ref 8–23)
CO2: 24 mmol/L (ref 22–32)
Calcium: 8.4 mg/dL — ABNORMAL LOW (ref 8.9–10.3)
Chloride: 103 mmol/L (ref 98–111)
Creatinine, Ser: 0.61 mg/dL (ref 0.61–1.24)
GFR calc Af Amer: >60 mL/min (ref >60)
GFR calc non Af Amer: >60 mL/min (ref >60)
Glucose, Bld: 87 mg/dL (ref 70–99)
Potassium: 3.5 mmol/L (ref 3.5–5.1)
Sodium: 135 mmol/L (ref 135–145)
Total Bilirubin: 0.9 mg/dL (ref 0.3–1.2)
Total Protein: 5.2 g/dL — ABNORMAL LOW (ref 6.5–8.1)

## 2019-02-23 LAB — CBC
HCT: 24.5 % — ABNORMAL LOW (ref 39.0–52.0)
Hemoglobin: 8.4 g/dL — ABNORMAL LOW (ref 13.0–17.0)
MCH: 34.6 pg — ABNORMAL HIGH (ref 26.0–34.0)
MCHC: 34.3 g/dL (ref 30.0–36.0)
MCV: 100.8 fL — ABNORMAL HIGH (ref 80.0–100.0)
Platelets: 121 10*3/uL — ABNORMAL LOW (ref 150–400)
RBC: 2.43 MIL/uL — ABNORMAL LOW (ref 4.22–5.81)
RDW: 13.1 % (ref 11.5–15.5)
WBC: 6.8 10*3/uL (ref 4.0–10.5)
nRBC: 0 % (ref 0.0–0.2)

## 2019-02-23 LAB — GLUCOSE, CAPILLARY
Glucose-Capillary: 65 mg/dL — ABNORMAL LOW (ref 70–99)
Glucose-Capillary: 126 mg/dL — ABNORMAL HIGH (ref 70–99)

## 2019-02-23 LAB — PROTIME-INR
INR: 1.1 (ref 0.8–1.2)
Prothrombin Time: 13.9 seconds (ref 11.4–15.2)

## 2019-02-23 MED ORDER — DEXTROSE 50 % IV SOLN
25.0000 mL | Freq: Once | INTRAVENOUS | Status: AC
  Administered 2019-02-23: 25 mL via INTRAVENOUS

## 2019-02-23 MED ORDER — DEXTROSE-NACL 5-0.9 % IV SOLN: INTRAVENOUS | Status: DC

## 2019-02-23 MED ORDER — ORAL CARE MOUTH RINSE
15.0000 mL | Freq: Two times a day (BID) | OROMUCOSAL | Status: DC
  Administered 2019-02-23 – 2019-03-03 (×8): 15 mL via OROMUCOSAL

## 2019-02-23 MED ORDER — DEXTROSE 50 % IV SOLN
INTRAVENOUS | Status: AC
  Filled 2019-02-23: qty 50

## 2019-02-23 NOTE — Progress Notes (Signed)
PROGRESS NOTE  Edwin Torres [id number] DOB: 1955/09/26 DOA: 02/22/2019 PCP: Patient, No Pcp Per  Hospital Course/Subjective: Edwin Torres is a 63 y.o. male with medical history significant of alcohol abuse, tobacco abuse brought by EMS and [facility] Department to emergency department for evaluation of recent fall.  Patient has history of alcohol abuse and frequent falls and this morning he fell 3 hours prior to arrival to emergency department and had large laceration to his left side of head.  Patient refused to come to the hospital so his wife called police and brought patient to the emergency department for further evaluation and management.  Since admission, has been stable and requiring IV ativan per CIWA protocol. Per RN, awaiting formal speech evaluation however we tried to get him to take a sip of water this AM and he was not able to even bring cup to his lips so will need to remain NPO for now. He has no complaints, can tell me he is in the hospital and that it's December but can't tell me the year or any other details.   Assessment/Plan:  Principal Problem:   Frequent falls Active Problems:   Alcohol abuse with intoxication (HCC)   Increased anion gap metabolic acidosis   Macrocytic anemia   Tobacco abuse   Physical deconditioning   Alcohol intoxication ([place])   Thrombocytopenia (HCC)   Scalp laceration   Frequent Falls 2/2 to Alcohol intoxication: -per wife patient drinks beer all day and has for many years -CT head without contrast and CT cervical spine: Negative for acute findings.  UA and UDS: Negative. -Admit patient to stepdown unit for close monitoring.  On telemetry. -Started on CIWA protocol. -Received banana bag x1 in ED.  Started on IV thiamine and folic acid. -Frequent neuro checks. -On seizure and aspiration precautions. -We will keep him n.p.o. until he passes the bedside swallow evaluation, which I don't think he will do today -Consulted  PT/ST/OT -I think he is high risk for severe withdrawal, I considered scheduled Librium but he will not be able to take PO. Low threshold for ICU admission and Precedex drip.  Increased anion gap metabolic acidosis: Anion gap of 19 -Likely secondary to alcohol -Continue to monitor.  Macrocytic anemia: -Secondary to alcohol abuse. -Check folate, B12 and TSH level  Thrombocytopenia: Platelet count is 144 -Likely secondary from alcohol abuse -Monitor platelet count and signs of bleeding.  Scalp laceration: -On left side.  Repaired in ED -Tylenol as needed for pain control.  Physical deconditioning/poor appetite: -Consulted dietitian  DVT prophylaxis: TED/SCD Code Status: Full code Family Communication: Admitting MD talked to his wife Ms. [name] & discussed about the patient & she verbalized understanding.  Disposition Plan: TBD Consults called: None Admission status: Inpatient at SDU   Objective: Vitals:   02/23/19 0906 02/23/19 0921 02/23/19 0952 02/23/19 1022  BP: 121/78 (!) 129/101 119/77 111/78  Pulse: 67     Resp: 13 12 12 11   Temp:      TempSrc:      SpO2:      Weight:      Height:        Intake/Output Summary (Last 24 hours) at 02/23/2019 1055 Last data filed at 02/23/2019 1000 Gross per 24 hour  Intake 1015.79 ml  Output 400 ml  Net 615.79 ml   Filed Weights   02/22/19 0623  Weight: 63.5 kg     Exam: General:  Sleepy, arousable, pleasant oriented to self and place only;  tremulous, not agitated, does not appear to be having any hallucinations. Bruises and cuts in various stages of healing all over his body Eyes: EOMI, clear sclerea Neck: supple, no masses, trachea mildline  Cardiovascular: RRR, no murmurs or rubs, no peripheral edema  Respiratory: clear to auscultation bilaterally, no wheezes, no crackles  Abdomen: soft, nontender, nondistended, normal bowel tones heard  Skin: dry, no rashes  Musculoskeletal: no joint effusions, normal range  of motion  Psychiatric: appropriate affect, normal speech  Neurologic: extraocular muscles intact, moving all extremities with intact sensorium    Data Reviewed: CBC: Recent Labs  Lab 02/22/19 0641 02/23/19 0832  WBC 9.1 6.8  NEUTROABS 6.6  --   HGB 11.9* 8.4*  HCT 34.7* 24.5*  MCV 100.3* 100.8*  PLT 144* 123XX123*   Basic Metabolic Panel: Recent Labs  Lab 02/22/19 0641 02/22/19 1626 02/23/19 0832  NA 132*  --  135  K 4.1  --  3.5  CL 98  --  103  CO2 15*  --  24  GLUCOSE 171*  --  87  BUN 5*  --  9  CREATININE 0.91  --  0.61  CALCIUM 8.7*  --  8.4*  MG  --  1.8  --   PHOS  --  4.3  --    GFR: Estimated Creatinine Clearance: 83.8 mL/min (by C-G formula based on SCr of 0.61 mg/dL). Liver Function Tests: Recent Labs  Lab 02/22/19 0641 02/23/19 0832  AST 47* 44*  ALT 34 26  ALKPHOS 114 99  BILITOT 0.6 0.9  PROT 6.0* 5.2*  ALBUMIN 3.2* 2.9*   No results for input(s): LIPASE, AMYLASE in the last 168 hours. No results for input(s): AMMONIA in the last 168 hours. Coagulation Profile: Recent Labs  Lab 02/22/19 1506 02/23/19 0832  INR 1.1 1.1   Cardiac Enzymes: No results for input(s): CKTOTAL, CKMB, CKMBINDEX, TROPONINI in the last 168 hours. BNP (last 3 results) No results for input(s): PROBNP in the last 8760 hours. HbA1C: No results for input(s): HGBA1C in the last 72 hours. CBG: Recent Labs  Lab 02/23/19 0857 02/23/19 0937  GLUCAP 65* 126*   Lipid Profile: No results for input(s): CHOL, HDL, LDLCALC, TRIG, CHOLHDL, LDLDIRECT in the last 72 hours. Thyroid Function Tests: No results for input(s): TSH, T4TOTAL, FREET4, T3FREE, THYROIDAB in the last 72 hours. Anemia Panel: Recent Labs    02/22/19 1626  VITAMINB12 396   Urine analysis:    Component Value Date/Time   COLORURINE YELLOW 02/22/2019 1039   APPEARANCEUR HAZY (A) 02/22/2019 1039   LABSPEC 1.005 02/22/2019 1039   PHURINE 6.0 02/22/2019 1039   GLUCOSEU NEGATIVE 02/22/2019 1039   HGBUR  NEGATIVE 02/22/2019 1039   BILIRUBINUR NEGATIVE 02/22/2019 1039   KETONESUR NEGATIVE 02/22/2019 1039   PROTEINUR NEGATIVE 02/22/2019 1039   NITRITE NEGATIVE 02/22/2019 1039   LEUKOCYTESUR NEGATIVE 02/22/2019 1039   Sepsis Labs: @LABRCNTIP (procalcitonin:4,lacticidven:4)  ) Recent Results (from the past 240 hour(s))  SARS CORONAVIRUS 2 (TAT 6-24 HRS) Nasopharyngeal Nasopharyngeal Swab     Status: None   Collection Time: 02/22/19  4:26 PM   Specimen: Nasopharyngeal Swab  Result Value Ref Range Status   SARS Coronavirus 2 NEGATIVE NEGATIVE Final    Comment: (NOTE) SARS-CoV-2 target nucleic acids are NOT DETECTED. The SARS-CoV-2 RNA is generally detectable in upper and lower respiratory specimens during the acute phase of infection. Negative results do not preclude SARS-CoV-2 infection, do not rule out co-infections with other pathogens, and should not be used  as the sole basis for treatment or other patient management decisions. Negative results must be combined with clinical observations, patient history, and epidemiological information. The expected result is Negative. Fact Sheet for Patients: SugarRoll.be Fact Sheet for Healthcare Providers: https://www.woods-mathews.com/ This test is not yet approved or cleared by the [place] FDA and  has been authorized for detection and/or diagnosis of SARS-CoV-2 by FDA under an Emergency Use Authorization (EUA). This EUA will remain  in effect (meaning this test can be used) for the duration of the COVID-19 declaration under Section 56 4(b)(1) of the Act, 21 U.S.C. section 360bbb-3(b)(1), unless the authorization is terminated or revoked sooner. Performed at [facility], [place] [address], [place], [place] [address]      Studies: No results found.  Scheduled Meds: . folic acid  1 mg Intravenous Daily  . mouth rinse  15 mL Mouth Rinse BID  . multivitamin with minerals  1 tablet Oral  Daily  . thiamine injection  100 mg Intravenous Daily    Continuous Infusions: . dextrose 5 % and 0.9% NaCl 50 mL/hr at 02/23/19 1000     LOS: 1 day   Time spent: 26 minutes  Negar Sieler Marry Guan, MD [facility] Pager [phone]  If 7PM-7AM, please contact night-coverage [email or web address] Password [email or web address] 02/23/2019, 10:55 AM

## 2019-02-23 NOTE — Evaluation (Signed)
Physical Therapy Evaluation Patient Details Name: Edwin Torres MRN: [id number] DOB: 12-02-1955 Today's Date: 02/23/2019   History of Present Illness  63yo male s/p fall at home, brought to ED by police due to refusal to seek medical care at home. CT head and cervical spine clear of acute fracture. Ethanol level 177 in ED. PMH alcohol and tobacco abuse, seizures  Clinical Impression   Patient received in bed, very lethargic but did wake to verbal and tactile stimulation with extended time and repeated efforts. Remained with very flat affect and with somewhat lethargic state during most of session but did make some efforts to initiate and participate. Required maxAx2 for all mobility today, very unsteady with LOB and lean in multiple directions; when in steady, went into trunk extension and let legs slide forward in the lift requiring MaxAx2 to maintain upright in the lift. Poor to zero awareness of midline, often without attempts to correct LOB when it did occur. Max VC to bend knees during transfers, if left to his own devices/without external intervention he allows himself to topple backwards. He was left in bed with all needs met, nursing and safety sitter present and attending. Currently recommend SNF and 24/7A moving forward.      Follow Up Recommendations SNF;Supervision/Assistance - 24 hour    Equipment Recommendations  Other (comment)(defer to next setting)    Recommendations for Other Services       Precautions / Restrictions Precautions Precautions: Fall;Other (comment) Precaution Comments: very unsteady, loses balance all directions and goes into rigid extension when in stedy Restrictions Weight Bearing Restrictions: No      Mobility  Bed Mobility Overal bed mobility: Needs Assistance Bed Mobility: Supine to Sit;Sit to Supine     Supine to sit: Max assist;+2 for physical assistance Sit to supine: Max assist;+2 for physical assistance   General bed mobility comments:  strong lean to R and zero awareness of position in space, unable to maintain upright without Mod-Max support to do so sitting EOB  Transfers Overall transfer level: Needs assistance Equipment used: Ambulation equipment used Transfers: Sit to/from [facility] Sit to Stand: Max assist;+2 physical assistance Stand pivot transfers: Total assist;+2 physical assistance       General transfer comment: MaxAx2 to stand in stedy, poor initiation especially on L side, when standing in steady extended trunk and allowed LEs to slide out in front of him so his whole body was in extended position (almost rigid)  and required maxAx2 to maintain upright and safety in stedy during transfer  Ambulation/Gait             General Gait Details: deferred, safety  Stairs            Wheelchair Mobility    Modified Rankin (Stroke Patients Only)       Balance Overall balance assessment: Needs assistance Sitting-balance support: Bilateral upper extremity supported;Feet supported Sitting balance-Leahy Scale: Poor Sitting balance - Comments: strong R Lean, Mod-MaxA to maintain midline, no awareness of upright; lean sometimes shifted posterior and anterior as well Postural control: Right lateral lean Standing balance support: Bilateral upper extremity supported;During functional activity Standing balance-Leahy Scale: Zero Standing balance comment: almost rigid extension of trunk and allowed BLEs to slide forward when standing/sitting in steady, no positional awareness                             Pertinent Vitals/Pain Pain Assessment: Faces Pain Score: 0-No pain Faces  Pain Scale: No hurt Pain Intervention(s): Limited activity within patient's tolerance;Monitored during session    [place] expects to be discharged to:: Private residence Living Arrangements: Spouse/significant other               Additional Comments: patient unable to answer  PLOF questions, spouse not available. Unsure of prior functional level, home set up, equipment, etc    Prior Function           Comments: patient unable to answer PLOF questions, spouse not available. Unsure of prior functional level, home set up, equipment, etc     Hand Dominance        Extremity/Trunk Assessment   Upper Extremity Assessment Upper Extremity Assessment: Defer to OT evaluation    Lower Extremity Assessment Lower Extremity Assessment: Generalized weakness    Cervical / Trunk Assessment Cervical / Trunk Assessment: Normal  Communication      Cognition Arousal/Alertness: Lethargic Behavior During Therapy: Flat affect Overall Cognitive Status: No family/caregiver present to determine baseline cognitive functioning Area of Impairment: Orientation;Attention;Memory;Following commands;Safety/judgement;Awareness;Problem solving                 Orientation Level: Disoriented to;Place;Time;Situation Current Attention Level: Focused Memory: Decreased recall of precautions;Decreased short-term memory Following Commands: Follows one step commands inconsistently;Follows one step commands with increased time Safety/Judgement: Decreased awareness of safety;Decreased awareness of deficits Awareness: Intellectual Problem Solving: Slow processing;Decreased initiation;Difficulty sequencing;Requires verbal cues;Requires tactile cues General Comments: heavy cues and facilitation to participate in session, very poor awarness of orientation in space and no initiation to correct when he does have LOB      General Comments      Exercises     Assessment/Plan    PT Assessment Patient needs continued PT services  PT Problem List Decreased strength;Decreased cognition;Decreased knowledge of use of DME;Decreased activity tolerance;Decreased safety awareness;Decreased balance;Decreased knowledge of precautions;Decreased mobility;Decreased coordination       PT Treatment  Interventions DME instruction;Balance training;Gait training;Neuromuscular re-education;Stair training;Functional mobility training;Cognitive remediation;Therapeutic activities;Therapeutic exercise    PT Goals (Current goals can be found in the Care Plan section)  Acute Rehab PT Goals PT Goal Formulation: Patient unable to participate in goal setting Time For Goal Achievement: 03/09/19 Potential to Achieve Goals: Fair    Frequency Min 2X/week   Barriers to discharge        Co-evaluation PT/OT/SLP Co-Evaluation/Treatment: Yes Reason for Co-Treatment: Complexity of the patient's impairments (multi-system involvement);For patient/therapist safety;To address functional/ADL transfers PT goals addressed during session: Mobility/safety with mobility;Balance         AM-PAC PT "6 Clicks" Mobility  Outcome Measure Help needed turning from your back to your side while in a flat bed without using bedrails?: A Lot Help needed moving from lying on your back to sitting on the side of a flat bed without using bedrails?: A Lot Help needed moving to and from a bed to a chair (including a wheelchair)?: Total Help needed standing up from a chair using your arms (e.g., wheelchair or bedside chair)?: A Lot Help needed to walk in hospital room?: Total Help needed climbing 3-5 steps with a railing? : Total 6 Click Score: 9    End of Session Equipment Utilized During Treatment: Gait belt Activity Tolerance: Patient tolerated treatment well;Patient limited by fatigue Patient left: in bed;with call bell/phone within reach;with bed alarm set;with nursing/sitter in room Nurse Communication: Mobility status;Need for lift equipment PT Visit Diagnosis: Unsteadiness on feet (R26.81);Difficulty in walking, not elsewhere classified (R26.2);Muscle weakness (generalized) (M62.81);History of  falling (Z91.81);Other abnormalities of gait and mobility (R26.89)    Time: 1135-1200 PT Time Calculation (min) (ACUTE  ONLY): 25 min   Charges:   PT Evaluation $PT Eval High Complexity: 1 High           Windell Norfolk, DPT, PN1   Supplemental Physical Therapist [place]    Pager [phone] Acute Rehab Office [phone]

## 2019-02-23 NOTE — Evaluation (Signed)
Occupational Therapy Evaluation Patient Details Name: Edwin Torres MRN: [id number] DOB: [birth year] Today's Date: 02/23/2019    History of Present Illness 63yo male s/p fall at home, brought to ED by police due to refusal to seek medical care at home. CT head and cervical spine clear of acute fracture. Ethanol level 177 in ED. PMH alcohol and tobacco abuse, seizures   Clinical Impression   Pt PTA: Pt living with spouse and per too lethargic to answer PLOF question. Pt currently performing ADL functional transfers with maxA +2 to [place] +2 using stedy. Pt limited by withdrawal symptoms, weakness, poor ability to focus and follow commands and decreased activity tolerance. Pt maximal multimodal cues to arouse, to follow commands  about 50% followed throughout session and poor awareness of deficits with no ability to right reaction. Pt very weak, not lifting RUE to assist with face washing without hand over hand assist. Pt maxA to [place] for ADL tasks at this time. Pt following 50% of commands and very lethargic. Pt would greatly benefit from continued OT skilled services for ADL, mobility and safety in SNF setting. OT following acutely.     Follow Up Recommendations  SNF;Supervision/Assistance - 24 hour    Equipment Recommendations  Other (comment)(to be determined by next venue)    Recommendations for Other Services       Precautions / Restrictions Precautions Precautions: Fall;Other (comment) Precaution Comments: very unsteading; leans posteriorly and to Right; requires guarding on R side with stedy Restrictions Weight Bearing Restrictions: No      Mobility Bed Mobility Overal bed mobility: Needs Assistance Bed Mobility: Supine to Sit;Sit to Supine     Supine to sit: Max assist;+2 for physical assistance Sit to supine: Max assist;+2 for physical assistance   General bed mobility comments: strong lean to R and zero awareness of position in space, unable to maintain upright  without Mod-Max support to do so sitting EOB  Transfers Overall transfer level: Needs assistance Equipment used: Ambulation equipment used Transfers: Sit to/from [facility] Sit to Stand: Max assist;+2 physical assistance Stand pivot transfers: Total assist;+2 physical assistance       General transfer comment: MaxAx2 to stand in stedy, poor initiation especially on L side, when standing in steady extended trunk and allowed LEs to slide out in front of him so his whole body was in extended position (almost rigid)  and required maxAx2 to maintain upright and safety in stedy during transfer    Balance Overall balance assessment: Needs assistance Sitting-balance support: Bilateral upper extremity supported;Feet supported Sitting balance-Leahy Scale: Poor Sitting balance - Comments: strong R Lean, Mod-MaxA to maintain midline, no awareness of upright; lean sometimes shifted posterior and anterior as well Postural control: Right lateral lean Standing balance support: Bilateral upper extremity supported;During functional activity Standing balance-Leahy Scale: Zero Standing balance comment: extension of trunk leading to feet sliding forward and heavy lean posteriorly and to R.                           ADL either performed or assessed with clinical judgement   ADL Overall ADL's : Needs assistance/impaired Eating/Feeding: NPO   Grooming: Maximal assistance;Total assistance;Sitting;Bed level Grooming Details (indicate cue type and reason): very weak, not lifting RUE to assist with face washing without hand over hand assist Upper Body Bathing: Maximal assistance;Total assistance;Bed level;Sitting   Lower Body Bathing: Total assistance;Sitting/lateral leans;Bed level   Upper Body Dressing : Maximal assistance;Sitting   Lower  Body Dressing: Total assistance;Sitting/lateral leans;Sit to/from stand   Toilet Transfer: Maximal assistance;+2 for physical assistance;+2  for safety/equipment Toilet Transfer Details (indicate cue type and reason): requires stedy due to poor mobility Toileting- Clothing Manipulation and Hygiene: Maximal assistance;Total assistance;+2 for physical assistance;+2 for safety/equipment;Cueing for sequencing;Sitting/lateral lean;Bed level;Sit to/from stand       Functional mobility during ADLs: Maximal assistance;Total assistance;+2 for physical assistance;+2 for safety/equipment General ADL Comments: Pt limited by withdrawal symptoms, weakness, poor ability to focus and follow commands and decreased activity tolerance.     Vision Baseline Vision/History: No visual deficits Vision Assessment?: Vision impaired- to be further tested in functional context Additional Comments: Pt unable to keep eyes open for more than a few seconds at a time due to drowsiness.     Perception     Praxis      Pertinent Vitals/Pain Pain Assessment: Faces Pain Score: 0-No pain Faces Pain Scale: No hurt Pain Intervention(s): Limited activity within patient's tolerance     Hand Dominance Right   Extremity/Trunk Assessment Upper Extremity Assessment Upper Extremity Assessment: Generalized weakness   Lower Extremity Assessment Lower Extremity Assessment: Generalized weakness   Cervical / Trunk Assessment Cervical / Trunk Assessment: Normal   Communication Communication Communication: Expressive difficulties   Cognition Arousal/Alertness: Lethargic Behavior During Therapy: Flat affect Overall Cognitive Status: No family/caregiver present to determine baseline cognitive functioning Area of Impairment: Orientation;Attention;Memory;Following commands;Safety/judgement;Awareness;Problem solving                 Orientation Level: Disoriented to;Place;Time;Situation Current Attention Level: Focused Memory: Decreased recall of precautions;Decreased short-term memory Following Commands: Follows one step commands inconsistently;Follows one  step commands with increased time Safety/Judgement: Decreased awareness of safety;Decreased awareness of deficits Awareness: Intellectual Problem Solving: Slow processing;Decreased initiation;Difficulty sequencing;Requires verbal cues;Requires tactile cues General Comments: maximal multimodal cues to arouse, to follow commands  about 50% followed throughout session and poor awareness of deficits with no ability to right reaction.   General Comments       Exercises     Shoulder Instructions      Home Living Family/patient expects to be discharged to:: Private residence Living Arrangements: Spouse/significant other                               Additional Comments: patient unable to answer PLOF questions, spouse not available. Unsure of prior functional level, home set up, equipment, etc      Prior Functioning/Environment          Comments: patient unable to answer PLOF questions, spouse not available. Unsure of prior functional level, home set up, equipment, etc        OT Problem List: Decreased strength;Decreased activity tolerance;Impaired balance (sitting and/or standing);Decreased coordination;Decreased safety awareness;Impaired UE functional use      OT Treatment/Interventions: Self-care/ADL training;Therapeutic exercise;Energy conservation;Therapeutic activities;Patient/family education;Balance training;Cognitive remediation/compensation;Visual/perceptual remediation/compensation    OT Goals(Current goals can be found in the care plan section) Acute Rehab OT Goals Patient Stated Goal: unable to state OT Goal Formulation: With patient Time For Goal Achievement: 03/09/19 Potential to Achieve Goals: Good ADL Goals Pt Will Perform Grooming: with supervision;standing Pt Will Perform Upper Body Dressing: with set-up;sitting Pt Will Perform Lower Body Dressing: with min guard assist;sit to/from stand Pt Will Transfer to Toilet: with min guard  assist;ambulating Pt Will Perform Toileting - Clothing Manipulation and hygiene: with min guard assist;sitting/lateral leans;sit to/from stand Pt/caregiver will Perform Home Exercise Program: Increased strength;Both right and left  upper extremity;With written HEP provided  OT Frequency: Min 2X/week   Barriers to D/C:            Co-evaluation PT/OT/SLP Co-Evaluation/Treatment: Yes Reason for Co-Treatment: Complexity of the patient's impairments (multi-system involvement);To address functional/ADL transfers PT goals addressed during session: Mobility/safety with mobility;Balance OT goals addressed during session: ADL's and self-care;Strengthening/ROM      AM-PAC OT "6 Clicks" Daily Activity     Outcome Measure Help from another person eating meals?: Total Help from another person taking care of personal grooming?: A Lot Help from another person toileting, which includes using toliet, bedpan, or urinal?: Total Help from another person bathing (including washing, rinsing, drying)?: A Lot Help from another person to put on and taking off regular upper body clothing?: A Lot Help from another person to put on and taking off regular lower body clothing?: A Lot 6 Click Score: 10   End of Session Equipment Utilized During Treatment: Gait belt Nurse Communication: Mobility status  Activity Tolerance: Patient limited by fatigue;Patient limited by lethargy Patient left: in bed;with call bell/phone within reach;with bed alarm set;with nursing/sitter in room  OT Visit Diagnosis: Unsteadiness on feet (R26.81);Muscle weakness (generalized) (M62.81);Other symptoms and signs involving cognitive function                Time: [id number] OT Time Calculation (min): 23 min Charges:  OT General Charges $OT Visit: 1 Visit OT Evaluation $OT Eval Moderate Complexity: 1 Mod  Darryl Nestle) Marsa Aris OTR/L Acute Rehabilitation Services Pager: [phone] Office: [phone]   Jenene Slicker  Mohab Ashby 02/23/2019, 2:09 PM

## 2019-02-23 NOTE — Progress Notes (Signed)
Received called from patient's wife, gave her update on patient and how he is handling treatment.  Wife stated that she did speak with day nurse and let her know that patient is very sensitive to Ativan because the previous times the patient received Ativan, he became aggressive and combative.  So far, the patient is handling Ativan well, we are giving him small amounts as needed every hour.  Wife will speak to doctor in the am and will hopefully visit patient tomorrow.

## 2019-02-23 NOTE — Progress Notes (Signed)
Initial Nutrition Assessment  INTERVENTION:   If patient passes swallow evaluation: -Ensure Enlive po TID, each supplement provides 350 kcal and 20 grams of protein -Will need monitor for refeeding syndrome  If patient is unable to pass swallow evaluation, recommend placing Cortrak or NGT. Next Cortrak service is 1/5.  NUTRITION DIAGNOSIS:   Increased nutrient needs related to social / environmental circumstances(ETOH abuse) as evidenced by estimated needs.  GOAL:   Patient will meet greater than or equal to 90% of their needs  MONITOR:   Labs, Weight trends, I & O's, Diet advancement  REASON FOR ASSESSMENT:   Consult Assessment of nutrition requirement/status  ASSESSMENT:   63 y.o. male with medical history significant of alcohol abuse, tobacco abuse brought by EMS and [facility] Department to emergency department for evaluation of recent fall.  **RD working remotely**  Patient alert/oriented x 2. Per family pt drinks ETOH throughout the day most days for years now. Pt drinks ~4-5 beers daily. Pt has poor appetite and doesn't eat much.   Pt currently NPO until SLP can evaluate swallowing function. May need nutrition support if unable to safely swallow.  Per weight records, last recorded weight was from 2018. No recent weights available to assess for weight changes. Suspect some degree of malnutrition given long-term ETOH abuse and poor appetite.  I/Os:  +1.6L since admit UOP: 400 ml x 24 hrs  Medications: IV Folic acid, IV Thiamine, D5 infusion Labs reviewed: CBGs: 65-126  NUTRITION - FOCUSED PHYSICAL EXAM:  Working remotely.  Diet Order:   Diet Order            Diet NPO time specified  Diet effective now              EDUCATION NEEDS:   Not appropriate for education at this time  Skin:  Skin Assessment: Reviewed RN Assessment  Last BM:  PTA  Height:   Ht Readings from Last 1 Encounters:  02/23/19 5\' 6"  (1.676 m)    Weight:   Wt  Readings from Last 1 Encounters:  02/22/19 63.5 kg    Ideal Body Weight:  64.5 kg  BMI:  Body mass index is 22.6 kg/m.  Estimated Nutritional Needs:   Kcal:  1900-2100  Protein:  90-100g  Fluid:  2L/day  Clayton Bibles, MS, RD, LDN Inpatient Clinical Dietitian Pager: [phone] After Hours Pager: [phone]

## 2019-02-23 NOTE — ED Notes (Signed)
Pt is voluntary admit. NO IVC paperwork initiated during admission process.

## 2019-02-24 ENCOUNTER — Inpatient Hospital Stay (HOSPITAL_COMMUNITY): Payer: PPO

## 2019-02-24 DIAGNOSIS — E876 Hypokalemia: Secondary | ICD-10-CM | POA: Diagnosis not present

## 2019-02-24 DIAGNOSIS — G92 Toxic encephalopathy: Secondary | ICD-10-CM

## 2019-02-24 DIAGNOSIS — R400 Somnolence: Secondary | ICD-10-CM | POA: Clinically undetermined

## 2019-02-24 LAB — BLOOD GAS, ARTERIAL
Acid-Base Excess: 0.1 mmol/L (ref 0.0–2.0)
Bicarbonate: 23.4 mmol/L (ref 20.0–28.0)
Drawn by: 57060
FIO2: 21
O2 Saturation: 97.4 %
Patient temperature: 37.1
pCO2 arterial: 33.1 mmHg (ref 32.0–48.0)
pH, Arterial: 7.464 — ABNORMAL HIGH (ref 7.350–7.450)
pO2, Arterial: 79.8 mmHg — ABNORMAL LOW (ref 83.0–108.0)

## 2019-02-24 LAB — BASIC METABOLIC PANEL
Anion gap: 9 (ref 5–15)
BUN: <5 mg/dL — ABNORMAL LOW (ref 8–23)
CO2: 23 mmol/L (ref 22–32)
Calcium: 8.3 mg/dL — ABNORMAL LOW (ref 8.9–10.3)
Chloride: 105 mmol/L (ref 98–111)
Creatinine, Ser: 0.5 mg/dL — ABNORMAL LOW (ref 0.61–1.24)
GFR calc Af Amer: >60 mL/min (ref >60)
GFR calc non Af Amer: >60 mL/min (ref >60)
Glucose, Bld: 99 mg/dL (ref 70–99)
Potassium: 2.8 mmol/L — ABNORMAL LOW (ref 3.5–5.1)
Sodium: 137 mmol/L (ref 135–145)

## 2019-02-24 LAB — COMPREHENSIVE METABOLIC PANEL
ALT: 23 U/L (ref 0–44)
AST: 45 U/L — ABNORMAL HIGH (ref 15–41)
Albumin: 2.8 g/dL — ABNORMAL LOW (ref 3.5–5.0)
Alkaline Phosphatase: 104 U/L (ref 38–126)
Anion gap: 10 (ref 5–15)
BUN: 5 mg/dL — ABNORMAL LOW (ref 8–23)
CO2: 23 mmol/L (ref 22–32)
Calcium: 8 mg/dL — ABNORMAL LOW (ref 8.9–10.3)
Chloride: 104 mmol/L (ref 98–111)
Creatinine, Ser: 0.55 mg/dL — ABNORMAL LOW (ref 0.61–1.24)
GFR calc Af Amer: >60 mL/min (ref >60)
GFR calc non Af Amer: >60 mL/min (ref >60)
Glucose, Bld: 100 mg/dL — ABNORMAL HIGH (ref 70–99)
Potassium: 2.9 mmol/L — ABNORMAL LOW (ref 3.5–5.1)
Sodium: 137 mmol/L (ref 135–145)
Total Bilirubin: 1.3 mg/dL — ABNORMAL HIGH (ref 0.3–1.2)
Total Protein: 5.4 g/dL — ABNORMAL LOW (ref 6.5–8.1)

## 2019-02-24 LAB — CBC
HCT: 25.1 % — ABNORMAL LOW (ref 39.0–52.0)
Hemoglobin: 9 g/dL — ABNORMAL LOW (ref 13.0–17.0)
MCH: 35.2 pg — ABNORMAL HIGH (ref 26.0–34.0)
MCHC: 35.9 g/dL (ref 30.0–36.0)
MCV: 98 fL (ref 80.0–100.0)
Platelets: 130 10*3/uL — ABNORMAL LOW (ref 150–400)
RBC: 2.56 MIL/uL — ABNORMAL LOW (ref 4.22–5.81)
RDW: 12.4 % (ref 11.5–15.5)
WBC: 6.7 10*3/uL (ref 4.0–10.5)
nRBC: 0 % (ref 0.0–0.2)

## 2019-02-24 LAB — FOLATE RBC
Folate, Hemolysate: 331 ng/mL
Folate, RBC: 1351 ng/mL (ref >498)
Hematocrit: 24.5 % — ABNORMAL LOW (ref 37.5–51.0)

## 2019-02-24 LAB — GLUCOSE, CAPILLARY
Glucose-Capillary: 105 mg/dL — ABNORMAL HIGH (ref 70–99)
Glucose-Capillary: 95 mg/dL (ref 70–99)

## 2019-02-24 MED ORDER — SODIUM CHLORIDE 0.9 % IV SOLN: INTRAVENOUS | Status: DC

## 2019-02-24 MED ORDER — LOPERAMIDE HCL 2 MG PO CAPS: 2.0000 mg | ORAL_CAPSULE | ORAL | Status: AC | PRN

## 2019-02-24 MED ORDER — CHLORDIAZEPOXIDE HCL 25 MG PO CAPS
25.0000 mg | ORAL_CAPSULE | Freq: Four times a day (QID) | ORAL | Status: AC | PRN
  Administered 2019-02-25 – 2019-02-26 (×2): 25 mg via ORAL
  Filled 2019-02-24 (×3): qty 1

## 2019-02-24 MED ORDER — POTASSIUM CHLORIDE CRYS ER 20 MEQ PO TBCR
40.0000 meq | EXTENDED_RELEASE_TABLET | Freq: Once | ORAL | Status: DC
  Filled 2019-02-24: qty 2

## 2019-02-24 MED ORDER — POTASSIUM CHLORIDE IN NACL 40-0.9 MEQ/L-% IV SOLN
INTRAVENOUS | Status: DC
  Administered 2019-02-24 – 2019-02-25 (×3): 100 mL/h via INTRAVENOUS
  Administered 2019-02-26: 75 mL/h via INTRAVENOUS
  Administered 2019-02-27: 50 mL/h via INTRAVENOUS
  Filled 2019-02-24 (×6): qty 1000

## 2019-02-24 MED ORDER — HYDROXYZINE HCL 25 MG PO TABS: 25.0000 mg | ORAL_TABLET | Freq: Four times a day (QID) | ORAL | Status: DC | PRN

## 2019-02-24 MED ORDER — IOHEXOL 350 MG/ML SOLN
100.0000 mL | Freq: Once | INTRAVENOUS | Status: AC | PRN
  Administered 2019-02-24: 100 mL via INTRAVENOUS

## 2019-02-24 MED ORDER — DIPHENHYDRAMINE HCL 50 MG/ML IJ SOLN
25.0000 mg | Freq: Four times a day (QID) | INTRAMUSCULAR | Status: AC | PRN
  Administered 2019-02-24 – 2019-02-25 (×2): 25 mg via INTRAVENOUS
  Filled 2019-02-24 (×2): qty 1

## 2019-02-24 NOTE — Code Documentation (Addendum)
Stroke Response Nurse Documentation Code Documentation  Edwin Torres is a 63 y.o. male admitted to [place]. [facility] called a Code Stroke on 02/24/2019 with past medical hx of alcoholism and seizure. Code stroke was activated by Clair Gulling, RN.  he was LKW at 1000 when neuro assessment completed by primary RN and now complaining of change in pupillary response on the right eye. On No antithrombotic. Stroke team at the bedside. Patient cleared for CT by Dr. Rory Percy. Patient to CT with team. NIHSS 7, see documentation for details and code stroke times. Patient with decreased LOC and disoriented with right pupil sluggish on exam. The following imaging was completed:  (CT, CTA head and neck, CTP). Patient is not a candidate for tPA due to exam not consistent with stroke. CTA negative for LVO. Pt taken to back to [address]. Pt to have q2 hour mNIHSS. Bedside handoff with ED RN Clair Gulling.    Kathrin Greathouse  Stroke Response RN

## 2019-02-24 NOTE — Progress Notes (Addendum)
At 1515 patient noted to have non reactive right pupil.  Notified Dr. Maryland Pink and called code stroke.  Code stroke RN, rapid response, and neurology responded to patient room.  Patient taken for stat head/neck CT angio.  This was negative per neuro, who also noted sluggish right pupil response at that time.  Will monitor closely per orders. Wife updated by phone.  Safety sitter remains at bedside.

## 2019-02-24 NOTE — TOC Initial Note (Signed)
Transition of Care [facility]) - Initial/Assessment Note    Patient Details  Name: Edwin Torres MRN: [id number] Date of Birth: [birth year]  Transition of Care [facility]) CM/SW Contact:    Eileen Stanford, LCSW Phone Number: 02/24/2019, 1:30 PM  Clinical Narrative:   Pt is disoriented x4. CSW spoke with pt's spouse. Pt's spouse is agreeable to SNF. Pt's spouse states she would like pt in [place]. Pt has been faxed out to [place] facilities. Will present bed offers once available.                Expected Discharge Plan: Skilled Nursing Facility Barriers to Discharge: Continued Medical Work up   Patient Goals and CMS Choice Patient states their goals for this hospitalization and ongoing recovery are:: "pt to go to rehab" -wife CMS Medicare.gov Compare Post Acute Care list provided to:: Patient Represenative (must comment)(spouse) Choice offered to / list presented to : Spouse  Expected Discharge Plan and Services Expected Discharge Plan: [place] In-house Referral: NA   Post Acute Care Choice: [place] Living arrangements for the past 2 months: Single Family Home                                      Prior Living Arrangements/Services Living arrangements for the past 2 months: Single Family Home Lives with:: Spouse Patient language and need for interpreter reviewed:: Yes Do you feel safe going back to the place where you live?: Yes      Need for Family Participation in Patient Care: Yes (Comment) Care giver support system in place?: Yes (comment)   Criminal Activity/Legal Involvement Pertinent to Current Situation/Hospitalization: No - Comment as needed  Activities of Daily Living Home Assistive Devices/Equipment: (P) None ADL Screening (condition at time of admission) Patient's cognitive ability adequate to safely complete daily activities?: (P) No Is the patient deaf or have difficulty hearing?: (P) No Does the patient have difficulty  seeing, even when wearing glasses/contacts?: (P) No Does the patient have difficulty concentrating, remembering, or making decisions?: (P) Yes Patient able to express need for assistance with ADLs?: (P) No Does the patient have difficulty dressing or bathing?: (P) Yes Independently performs ADLs?: (P) No Communication: (P) Independent Dressing (OT): (P) Needs assistance Is this a change from baseline?: (P) Change from baseline, expected to last <3days Grooming: (P) Needs assistance Is this a change from baseline?: (P) Change from baseline, expected to last <3 days Feeding: (P) Needs assistance Is this a change from baseline?: (P) Change from baseline, expected to last <3 days Bathing: (P) Needs assistance Is this a change from baseline?: (P) Change from baseline, expected to last <3 days Toileting: (P) Needs assistance Is this a change from baseline?: (P) Change from baseline, expected to last <3 days In/Out Bed: (P) Needs assistance Is this a change from baseline?: (P) Change from baseline, expected to last <3 days Walks in Home: (P) Needs assistance Is this a change from baseline?: (P) Change from baseline, expected to last <3 days Does the patient have difficulty walking or climbing stairs?: (P) Yes Weakness of Legs: (P) Both Weakness of Arms/Hands: (P) Both  Permission Sought/Granted Permission sought to share information with : Family Supports    Share Information with NAME: [name]     Permission granted to share info w Relationship: Spouse     Emotional Assessment Appearance:: Appears stated age Attitude/Demeanor/Rapport: Unable to Assess Affect (typically  observed): Unable to Assess Orientation: : (disoriented x4) Alcohol / Substance Use: Not Applicable Psych Involvement: No (comment)  Admission diagnosis:  Alcoholic ketoacidosis [id number] Alcohol intoxication ([place]) [F10.929] Laceration of scalp, initial encounter [S01.01XA] Fall in home, initial encounter [W19.XXXA,  [id number] Alcoholic intoxication with complication [facility]) [id number] Patient Active Problem List   Diagnosis Date Noted  . Alcohol abuse with intoxication ([place]) 02/22/2019  . Frequent falls 02/22/2019  . Increased anion gap metabolic acidosis [id number]  . Macrocytic anemia 02/22/2019  . Tobacco abuse 02/22/2019  . Physical deconditioning 02/22/2019  . Alcohol intoxication ([place]) 02/22/2019  . Thrombocytopenia ([place]) 02/22/2019  . Scalp laceration 02/22/2019  . Visit for suture removal 08/20/2016  . Weakness of right third cranial nerve 03/21/2016   PCP:  Patient, No Pcp Per Pharmacy:   [facility] [place], [place] [address] [place] [address] Phone: [phone] Fax: [phone]     Social Determinants of Health (SDOH) Interventions    Readmission Risk Interventions No flowsheet data found.

## 2019-02-24 NOTE — Progress Notes (Addendum)
PROGRESS NOTE  Edwin Torres [id number] DOB: 28-Jan-1956 DOA: 02/22/2019 PCP: Patient, No Pcp Per  HPI/Recap of past 24 hours: Patient is a 63 year old male with past medical history of alcohol and tobacco abuse brought in on late afternoon of 12/29 by EMS in [place] [employer] for a recent fall that occurred 3 hours prior that led to a large laceration on the left side of his head.  Patient is she refused to come in so his wife had called the emergency services and he was brought in.  His last drink of beer had been when the fall occurred.  Since admission, he has been stable, requiring occasional doses of IV Ativan per CIWA protocol.  Patient more somnolent today.  He received some IV Ativan in the early morning hours based off of CIWA protocol.  Vitals have otherwise been stable.  Addendum to note: Given patient's somnolence, ordered ABG.  Nursing noted he was starting to wake, but then also noted that he appeared to be slightly confused and his right pupil was not responding to light (later clarified as more sluggish to light).  Blood pressure stable.  Soon as I was informed, we immediately called a code stroke and patient is going for head CT.  Head CT unremarkable.  Neurology consulted.  MRI of the brain ordered.  This may be more secondary to his sedation.  He is now just on as needed Librium.  Continue to follow.  Assessment/Plan: Principal Problem:   Frequent falls secondary to frequent alcohol intake Active Problems:   Alcohol abuse with intoxication ([place]): Seems to be through withdrawals.  I had a conversation with his wife and the patient has not expressed any interest in cutting back or quitting drinking.  We will plan to likely discharge tomorrow once he is more awake and PT and OT has seen him.  Would not discharge him on any Librium unless he suddenly expresses an interest in quitting drinking.    Increased anion gap metabolic acidosis: Seems to have resolved.  May have been  alcoholic ketosis.    Macrocytic anemia: Stable.,  Secondary to drinking.    Tobacco abuse: Nicotine patch    Physical deconditioning: PT OT to see when patient is more awake.    Thrombocytopenia ([place]): Slightly low.  No evidence of bleeding.  Likely secondary to patient's alcohol intoxication.   Scalp laceration: Repaired in the ED.  Tylenol for pain control.    Hypokalemia: Down to 2.8 today.  Will replace.    Somnolence, daytime: May be residual IV Ativan he received earlier this morning.  Patient's wife states that when he gets Ativan, he becomes quite agitated.  Have switched this over to Librium.  We will go ahead and also check an ABG to ensure these not have any hypercarbia.  See above in regards to pupillary dysfunction   Code Status: Full code  Family Communication: Wife at the bedside  Disposition Plan: Anticipate discharge tomorrow once he is more awake and seen by PT/OT   Consultants:  None  Procedures:  None  Antimicrobials:  None  DVT prophylaxis: SCDs   Objective: Vitals:   02/24/19 1210 02/24/19 1225  BP: 94/64 101/66  Pulse: 70 74  Resp: (!) 24 (!) 21  Temp:    SpO2: 96% 96%    Intake/Output Summary (Last 24 hours) at 02/24/2019 1450 Last data filed at 02/24/2019 0703 Gross per 24 hour  Intake 273.47 ml  Output 1175 ml  Net -901.53 ml  Filed Weights   02/22/19 0623  Weight: 63.5 kg   Body mass index is 22.6 kg/m.  Exam:   General: Currently somnolent, no acute distress  Cardiovascular: Regular rate and rhythm, S1-S2  Respiratory: Clear to auscultation bilaterally  Abdomen: Soft, nontender, positive bowel sounds  Musculoskeletal: No clubbing or cyanosis or edema, SCDs   Data Reviewed: CBC: Recent Labs  Lab 02/22/19 0641 02/23/19 0832 02/24/19 1142  WBC 9.1 6.8 6.7  NEUTROABS 6.6  --   --   HGB 11.9* 8.4* 9.0*  HCT 34.7* 24.5* 25.1*  MCV 100.3* 100.8* 98.0  PLT 144* 121* [id number]*   Basic Metabolic Panel: Recent  Labs  Lab 02/22/19 0641 02/22/19 1626 02/23/19 0832 02/24/19 1142  NA 132*  --  135 137  K 4.1  --  3.5 2.8*  CL 98  --  103 105  CO2 15*  --  24 23  GLUCOSE 171*  --  87 99  BUN 5*  --  9 <5*  CREATININE 0.91  --  0.61 0.50*  CALCIUM 8.7*  --  8.4* 8.3*  MG  --  1.8  --   --   PHOS  --  4.3  --   --    GFR: Estimated Creatinine Clearance: 83.8 mL/min (A) (by C-G formula based on SCr of 0.5 mg/dL (L)). Liver Function Tests: Recent Labs  Lab 02/22/19 0641 02/23/19 0832  AST 47* 44*  ALT 34 26  ALKPHOS 114 99  BILITOT 0.6 0.9  PROT 6.0* 5.2*  ALBUMIN 3.2* 2.9*   No results for input(s): LIPASE, AMYLASE in the last 168 hours. No results for input(s): AMMONIA in the last 168 hours. Coagulation Profile: Recent Labs  Lab 02/22/19 1506 02/23/19 0832  INR 1.1 1.1   Cardiac Enzymes: No results for input(s): CKTOTAL, CKMB, CKMBINDEX, TROPONINI in the last 168 hours. BNP (last 3 results) No results for input(s): PROBNP in the last 8760 hours. HbA1C: No results for input(s): HGBA1C in the last 72 hours. CBG: Recent Labs  Lab 02/23/19 0857 02/23/19 0937 02/24/19 0925  GLUCAP 65* 126* 105*   Lipid Profile: No results for input(s): CHOL, HDL, LDLCALC, TRIG, CHOLHDL, LDLDIRECT in the last 72 hours. Thyroid Function Tests: No results for input(s): TSH, T4TOTAL, FREET4, T3FREE, THYROIDAB in the last 72 hours. Anemia Panel: Recent Labs    02/22/19 1626  VITAMINB12 396   Urine analysis:    Component Value Date/Time   COLORURINE YELLOW 02/22/2019 1039   APPEARANCEUR HAZY (A) 02/22/2019 1039   LABSPEC 1.005 02/22/2019 1039   PHURINE 6.0 02/22/2019 1039   GLUCOSEU NEGATIVE 02/22/2019 1039   HGBUR NEGATIVE 02/22/2019 1039   BILIRUBINUR NEGATIVE 02/22/2019 1039   KETONESUR NEGATIVE 02/22/2019 1039   PROTEINUR NEGATIVE 02/22/2019 1039   NITRITE NEGATIVE 02/22/2019 1039   LEUKOCYTESUR NEGATIVE 02/22/2019 1039   Sepsis  Labs: @LABRCNTIP (procalcitonin:4,lacticidven:4)  ) Recent Results (from the past 240 hour(s))  SARS CORONAVIRUS 2 (TAT 6-24 HRS) Nasopharyngeal Nasopharyngeal Swab     Status: None   Collection Time: 02/22/19  4:26 PM   Specimen: Nasopharyngeal Swab  Result Value Ref Range Status   SARS Coronavirus 2 NEGATIVE NEGATIVE Final    Comment: (NOTE) SARS-CoV-2 target nucleic acids are NOT DETECTED. The SARS-CoV-2 RNA is generally detectable in upper and lower respiratory specimens during the acute phase of infection. Negative results do not preclude SARS-CoV-2 infection, do not rule out co-infections with other pathogens, and should not be used as the sole basis for treatment  or other patient management decisions. Negative results must be combined with clinical observations, patient history, and epidemiological information. The expected result is Negative. Fact Sheet for Patients: SugarRoll.be Fact Sheet for Healthcare Providers: https://www.woods-mathews.com/ This test is not yet approved or cleared by the [place] FDA and  has been authorized for detection and/or diagnosis of SARS-CoV-2 by FDA under an Emergency Use Authorization (EUA). This EUA will remain  in effect (meaning this test can be used) for the duration of the COVID-19 declaration under Section 56 4(b)(1) of the Act, 21 U.S.C. section 360bbb-3(b)(1), unless the authorization is terminated or revoked sooner. Performed at [facility], [address] [place], [place] [address]       Studies: No results found.  Scheduled Meds: . folic acid  1 mg Intravenous Daily  . mouth rinse  15 mL Mouth Rinse BID  . multivitamin with minerals  1 tablet Oral Daily  . potassium chloride  40 mEq Oral Once  . thiamine injection  100 mg Intravenous Daily    Continuous Infusions: . dextrose 5 % and 0.9% NaCl 50 mL/hr at 02/24/19 0504     LOS: 2 days     Annita Brod,  MD [facility]  To reach me or the doctor on call, go to: [email or web address] Password [email or web address]  02/24/2019, 2:50 PM

## 2019-02-24 NOTE — Progress Notes (Signed)
Physical Therapy Treatment Patient Details Name: Edwin Torres MRN: [id number] DOB: 05/09/1955 Today's Date: 02/24/2019    History of Present Illness 63yo male s/p fall at home, brought to ED by police due to refusal to seek medical care at home. CT head and cervical spine clear of acute fracture. Ethanol level 177 in ED. PMH alcohol and tobacco abuse, seizures    PT Comments    Patient received in bed, asleep and lethargic. Used max multimodal cueing to arouse, really did not open eyes and begin interacting with therapist until sitting at EOB. Required MaxAx2 for all bed mobility, once sitting on EOB demonstrated multidirectional balance loss and strong anterior lean today with no attempts to correct or initiate righting reaction. At some point during session IV slid out of arm (unsure when or how- no tension present on line during session. He was returned to supine with maxAx2, RN notified and IV site wrapped with a clean towel. He was left in bed with all needs met and safety sitter present.    Follow Up Recommendations  SNF;Supervision/Assistance - 24 hour     Equipment Recommendations  Other (comment)(defer to next setting)    Recommendations for Other Services       Precautions / Restrictions Precautions Precautions: Fall;Other (comment) Precaution Comments: very unsteading; multidirectional balance loss Restrictions Weight Bearing Restrictions: No    Mobility  Bed Mobility Overal bed mobility: Needs Assistance Bed Mobility: Supine to Sit;Sit to Supine     Supine to sit: Max assist;+2 for physical assistance Sit to supine: Max assist;+2 for physical assistance   General bed mobility comments: multidirectional balance loss once seated at EOB , tended to lean more anteriorly today as compared to prior sessions  Transfers                 General transfer comment: deferred- multidirectional balance loss and IV out of arm  Ambulation/Gait              General Gait Details: deferred, safety   Stairs             Wheelchair Mobility    Modified Rankin (Stroke Patients Only)       Balance Overall balance assessment: Needs assistance Sitting-balance support: Bilateral upper extremity supported;Feet supported Sitting balance-Leahy Scale: Zero Sitting balance - Comments: multidirectional balance loss, no initiation to correct and no righting reflexes                                    Cognition Arousal/Alertness: Lethargic Behavior During Therapy: Flat affect Overall Cognitive Status: No family/caregiver present to determine baseline cognitive functioning Area of Impairment: Orientation;Attention;Memory;Following commands;Safety/judgement;Awareness;Problem solving                 Orientation Level: Disoriented to;Place;Time;Situation Current Attention Level: Focused Memory: Decreased recall of precautions;Decreased short-term memory Following Commands: Follows one step commands inconsistently Safety/Judgement: Decreased awareness of safety;Decreased awareness of deficits Awareness: Intellectual Problem Solving: Slow processing;Decreased initiation;Difficulty sequencing;Requires verbal cues;Requires tactile cues General Comments: max multimodal cues to arouse, no command following noted throughout session      Exercises      General Comments General comments (skin integrity, edema, etc.): VSS, at some point IV slid out of arm (unsure of when, possibly half out already since no tension was on line during session)      Pertinent Vitals/Pain Pain Assessment: Faces Pain Score: 0-No pain Faces Pain Scale:  No hurt Pain Intervention(s): Limited activity within patient's tolerance;Monitored during session    Home Living                      Prior Function            PT Goals (current goals can now be found in the care plan section) Acute Rehab PT Goals Patient Stated Goal: unable to  state PT Goal Formulation: Patient unable to participate in goal setting Time For Goal Achievement: 03/09/19 Potential to Achieve Goals: Fair Progress towards PT goals: Progressing toward goals    Frequency    Min 2X/week      PT Plan Current plan remains appropriate    Co-evaluation              AM-PAC PT "6 Clicks" Mobility   Outcome Measure  Help needed turning from your back to your side while in a flat bed without using bedrails?: A Lot Help needed moving from lying on your back to sitting on the side of a flat bed without using bedrails?: A Lot Help needed moving to and from a bed to a chair (including a wheelchair)?: Total Help needed standing up from a chair using your arms (e.g., wheelchair or bedside chair)?: Total Help needed to walk in hospital room?: Total Help needed climbing 3-5 steps with a railing? : Total 6 Click Score: 8    End of Session   Activity Tolerance: Patient limited by lethargy Patient left: in bed;with call bell/phone within reach;with bed alarm set;with nursing/sitter in room Nurse Communication: Other (comment)(IV out of arm) PT Visit Diagnosis: Unsteadiness on feet (R26.81);Difficulty in walking, not elsewhere classified (R26.2);Muscle weakness (generalized) (M62.81);History of falling (Z91.81);Other abnormalities of gait and mobility (R26.89)     Time: 6754-4920 PT Time Calculation (min) (ACUTE ONLY): 16 min  Charges:  $Therapeutic Activity: 8-22 mins                     Windell Norfolk, DPT, PN1   Supplemental Physical Therapist [place]    Pager [phone] Acute Rehab Office [phone]

## 2019-02-24 NOTE — Progress Notes (Addendum)
SLP Cancellation Note  Patient Details Name: Edwin Torres MRN: [id number] DOB: [birth year]   Cancelled treatment:       Reason Eval/Treat Not Completed: Medical issues which prohibited therapy;Fatigue/lethargy limiting ability to participate. BSE attempted, patient in very deep sleep. SLP unable to rouse to appropriate alertness. Will re-attempt later in day.   Addendum 4:32pm:  SLP re-attempted BSE. Patient just returning from CT scan, RN reports code stroke called for patient a little while ago. Patient not appropriate, will re-attempt as able.     Marina Goodell, M.Ed., CCC-SLP Speech Therapy Acute Rehabilitation 02/24/2019, 2:05 PM

## 2019-02-24 NOTE — Consult Note (Addendum)
Neurology Consultation  Reason for Consult: Code stroke for pupillary asymmetry Referring Physician: Gevena Barre MD  CC: Pupillary asymmetry  History is obtained from: Patient's RN, chart  HPI: Edwin Torres is a 63 y.o. male past medical history of alcoholism, frequent falls and seizures, likely alcohol related, on no antiepileptics, admitted for evaluation after recent fall late afternoon of 02/22/2019. He has been requiring high doses of Ativan on CIWA protocol. Last neuro check at 10 AM was normal but sometime around the calling with a code stroke, it was noted that the right pupil was not as reactive as the left. No other focal deficits. Patient received a total of 13 mg of Ativan overnight. He has also been somnolent since the morning.  The primary hospitalist in charge of patient's care ordered an ABG this morning due to increased somnolence-mildly acidotic but essentially unremarkable. Patient unable to provide reliable history at this time  LKW: 10 AM on 02/24/2019 tpa given?: no, outside the window, exam not compatible with stroke Premorbid modified Rankin scale (mRS): Unable to ascertain.  ROS:  Unable to obtain due to altered mental status.   Past Medical History:  Diagnosis Date  . Alcoholic ([place])   . Seizures ([place])     Family History  Problem Relation Age of Onset  . Cancer Mother   . Diabetes Mother   . Hyperlipidemia Father     Social History:   reports that he has been smoking. He uses smokeless tobacco. He reports current alcohol use of about 8.0 standard drinks of alcohol per week. He reports that he does not use drugs.   Medications  Current Facility-Administered Medications:  .  0.9 %  sodium chloride infusion, , Intravenous, Continuous, [place] Pink, Sendil K, MD .  0.9 % NaCl with KCl 40 mEq / L  infusion, , Intravenous, Continuous, Annita Brod, MD .  acetaminophen (TYLENOL) tablet 650 mg, 650 mg, Oral, Q6H PRN **OR** acetaminophen  (TYLENOL) suppository 650 mg, 650 mg, Rectal, Q6H PRN, Pahwani, Rinka R, MD .  chlordiazePOXIDE (LIBRIUM) capsule 25 mg, 25 mg, Oral, Q6H PRN, Gevena Barre K, MD .  dextrose 5 %-0.9 % sodium chloride infusion, , Intravenous, Continuous, Hollice Gong, Mir Mohammed, MD, Last Rate: 50 mL/hr at 02/24/19 0504, New Bag at 02/24/19 0504 .  folic acid injection 1 mg, 1 mg, Intravenous, Daily, Pahwani, Rinka R, MD, 1 mg at 02/24/19 0952 .  hydrOXYzine (ATARAX/VISTARIL) tablet 25 mg, 25 mg, Oral, Q6H PRN, Annita Brod, MD .  loperamide (IMODIUM) capsule 2-4 mg, 2-4 mg, Oral, PRN, Annita Brod, MD .  MEDLINE mouth rinse, 15 mL, Mouth Rinse, BID, Hollice Gong, Mir Mohammed, MD, 15 mL at 02/23/19 1203 .  multivitamin with minerals tablet 1 tablet, 1 tablet, Oral, Daily, Domenic Moras, PA-C .  ondansetron (ZOFRAN) tablet 4 mg, 4 mg, Oral, Q6H PRN **OR** ondansetron (ZOFRAN) injection 4 mg, 4 mg, Intravenous, Q6H PRN, Pahwani, Rinka R, MD .  potassium chloride SA (KLOR-CON) CR tablet 40 mEq, 40 mEq, Oral, Once, Annita Brod, MD .  thiamine (B-1) injection 100 mg, 100 mg, Intravenous, Daily, Pahwani, Rinka R, MD, 100 mg at 02/24/19 0856   Exam: Current vital signs: BP 121/71   Pulse (!) 101   Temp 98.9 F (37.2 C) (Axillary)   Resp 19   Ht 5\' 6"  (1.676 m)   Wt 63.5 kg   SpO2 98%   BMI 22.60 kg/m  Vital signs in last 24 hours: Temp:  [98.8 F (37.1  C)-99.3 F (37.4 C)] 98.9 F (37.2 C) (12/31 1200) Pulse Rate:  [68-136] 101 (12/31 1525) Resp:  [11-27] 19 (12/31 1525) BP: (93-135)/(60-95) 121/71 (12/31 1525) SpO2:  [95 %-100 %] 98 % (12/31 1525) General: Awake alert all over tremulous HEENT: Normocephalic atraumatic dry oral mucous membranes Lungs: Clear to auscultation cardiovascular: Mildly tachycardic Abdomen: Soft nondistended nontender Extremities well perfused without edema Neurologic exam Awake alert oriented to self. Able to follow simple commands.  Very poor attention  concentration Not oriented to place. Speech is dysarthric Cranial nerves: Right pupil 3 mm sluggishly reactive, left 3 mm brisker than right but still sluggishly reactive, extraocular movements intact, visual fields are full to threat Motor exam: No focal deficits but generally weak in all 4 extremities. Sensory exam: Equal withdrawal on noxious stimulation No gross dysmetria NIHSS 7   Labs I have reviewed labs in epic and the results pertinent to this consultation are:  CBC    Component Value Date/Time   WBC 6.7 02/24/2019 1142   RBC 2.56 (L) 02/24/2019 1142   HGB 9.0 (L) 02/24/2019 1142   HCT 25.1 (L) 02/24/2019 1142   HCT 24.5 (L) 02/22/2019 1533   PLT 130 (L) 02/24/2019 1142   MCV 98.0 02/24/2019 1142   MCH 35.2 (H) 02/24/2019 1142   MCHC 35.9 02/24/2019 1142   RDW 12.4 02/24/2019 1142   LYMPHSABS 1.9 02/22/2019 0641   MONOABS 0.5 02/22/2019 0641   EOSABS 0.0 02/22/2019 0641   BASOSABS 0.1 02/22/2019 0641    CMP     Component Value Date/Time   NA 137 02/24/2019 1142   K 2.8 (L) 02/24/2019 1142   CL 105 02/24/2019 1142   CO2 23 02/24/2019 1142   GLUCOSE 99 02/24/2019 1142   BUN <5 (L) 02/24/2019 1142   CREATININE 0.50 (L) 02/24/2019 1142   CALCIUM 8.3 (L) 02/24/2019 1142   PROT 5.2 (L) 02/23/2019 0832   ALBUMIN 2.9 (L) 02/23/2019 0832   AST 44 (H) 02/23/2019 0832   ALT 26 02/23/2019 0832   ALKPHOS 99 02/23/2019 0832   BILITOT 0.9 02/23/2019 0832   GFRNONAA >60 02/24/2019 1142   GFRAA >60 02/24/2019 1142  Hypokalemic  Imaging I have reviewed the images obtained:  CT-scan of the brain-no acute changes-generalized atrophy disproportionate to age likely related to alcohol abuse. CTA and perfusion unremarkable for emergent blood vessel occlusion or perfusion deficits.   Assessment: 63 year old with past medical history of alcohol and alcohol-related seizures and falls admitted after a fall, noted to have mild dissymmetry of pupillary dissymmetry with the  right pupil very sluggishly reactive-both pupils equal in size with mild asymmetry in the speed of reaction. CT CTA and CT perfusion studies unremarkable for acute process. Patient is being actively treated with alcohol withdrawal CIWA protocol-Ativan IV and I suspect that this asymmetry is either incidental, physiological or secondary to excessive sedation/benzodiazepine use. Given that he has been been in the best of health, will get further imaging in the form of an MRI to rule out a stroke and an EEG to ensure that he is not having any seizures that can lead to autonomic disturbances causing pupillary asymmetry  On examination he has mild pupillary reaction asymmetry as well as exam consistent with generalized encephalopathy.  Impression: Pupillary asymmetry-physiological or secondary to sedation Evaluate for stroke, seizure Toxic metabolic encephalopathy Alcohol withdrawal  Recommendations: Frequent neurochecks No antiepileptics for now Routine EEG MRI brain without contrast Minimize sedation-might be difficult given management of withdrawal Correct electrolyte abnormalities per  primary team We will follow with you  -- Amie Portland, MD [facility] Pager: [phone] If 7pm to 7am, please call on call as listed on AMION.  CRITICAL CARE ATTESTATION Performed by: Amie Portland, MD Total critical care time: 45 minutes Critical care time was exclusive of separately billable procedures and treating other patients and/or supervising APPs/Residents/Students Critical care was necessary to treat or prevent imminent or life-threatening deterioration due to concern for stroke-emergent stroke evaluation, pupillary asymmetry, toxic metabolic encephalopathy. This patient is critically ill and at significant risk for neurological worsening and/or death and care requires constant monitoring. Critical care was time spent personally by me on the following activities: development of  treatment plan with patient and/or surrogate as well as nursing, discussions with consultants, evaluation of patient's response to treatment, examination of patient, obtaining history from patient or surrogate, ordering and performing treatments and interventions, ordering and review of laboratory studies, ordering and review of radiographic studies, pulse oximetry, re-evaluation of patient's condition, participation in multidisciplinary rounds and medical decision making of high complexity in the care of this patient.

## 2019-02-25 ENCOUNTER — Inpatient Hospital Stay (HOSPITAL_COMMUNITY): Payer: PPO

## 2019-02-25 DIAGNOSIS — Z720 Tobacco use: Secondary | ICD-10-CM

## 2019-02-25 DIAGNOSIS — R5381 Other malaise: Secondary | ICD-10-CM

## 2019-02-25 DIAGNOSIS — R296 Repeated falls: Secondary | ICD-10-CM

## 2019-02-25 DIAGNOSIS — D539 Nutritional anemia, unspecified: Secondary | ICD-10-CM

## 2019-02-25 DIAGNOSIS — R4182 Altered mental status, unspecified: Secondary | ICD-10-CM

## 2019-02-25 DIAGNOSIS — E876 Hypokalemia: Secondary | ICD-10-CM

## 2019-02-25 DIAGNOSIS — D696 Thrombocytopenia, unspecified: Secondary | ICD-10-CM

## 2019-02-25 DIAGNOSIS — F10129 Alcohol abuse with intoxication, unspecified: Secondary | ICD-10-CM

## 2019-02-25 DIAGNOSIS — R400 Somnolence: Secondary | ICD-10-CM

## 2019-02-25 DIAGNOSIS — E872 Acidosis: Secondary | ICD-10-CM

## 2019-02-25 LAB — GLUCOSE, CAPILLARY: Glucose-Capillary: 71 mg/dL (ref 70–99)

## 2019-02-25 LAB — BASIC METABOLIC PANEL
Anion gap: 11 (ref 5–15)
BUN: 6 mg/dL — ABNORMAL LOW (ref 8–23)
CO2: 19 mmol/L — ABNORMAL LOW (ref 22–32)
Calcium: 8.2 mg/dL — ABNORMAL LOW (ref 8.9–10.3)
Chloride: 107 mmol/L (ref 98–111)
Creatinine, Ser: 0.53 mg/dL — ABNORMAL LOW (ref 0.61–1.24)
GFR calc Af Amer: >60 mL/min (ref >60)
GFR calc non Af Amer: >60 mL/min (ref >60)
Glucose, Bld: 75 mg/dL (ref 70–99)
Potassium: 3.3 mmol/L — ABNORMAL LOW (ref 3.5–5.1)
Sodium: 137 mmol/L (ref 135–145)

## 2019-02-25 LAB — CBC
HCT: 25.2 % — ABNORMAL LOW (ref 39.0–52.0)
Hemoglobin: 8.8 g/dL — ABNORMAL LOW (ref 13.0–17.0)
MCH: 34.6 pg — ABNORMAL HIGH (ref 26.0–34.0)
MCHC: 34.9 g/dL (ref 30.0–36.0)
MCV: 99.2 fL (ref 80.0–100.0)
Platelets: 138 10*3/uL — ABNORMAL LOW (ref 150–400)
RBC: 2.54 MIL/uL — ABNORMAL LOW (ref 4.22–5.81)
RDW: 12.4 % (ref 11.5–15.5)
WBC: 7.4 10*3/uL (ref 4.0–10.5)
nRBC: 0 % (ref 0.0–0.2)

## 2019-02-25 LAB — MAGNESIUM: Magnesium: 1.9 mg/dL (ref 1.7–2.4)

## 2019-02-25 MED ORDER — ENOXAPARIN SODIUM 40 MG/0.4ML ~~LOC~~ SOLN
40.0000 mg | SUBCUTANEOUS | Status: DC
  Administered 2019-02-25 – 2019-03-03 (×6): 40 mg via SUBCUTANEOUS
  Filled 2019-02-25 (×6): qty 0.4

## 2019-02-25 MED ORDER — POTASSIUM CHLORIDE CRYS ER 20 MEQ PO TBCR: 40.0000 meq | EXTENDED_RELEASE_TABLET | Freq: Once | ORAL | Status: DC

## 2019-02-25 NOTE — Plan of Care (Signed)
MRI with no stroke. Evolving scalp hematoma with staples. EEG - pending. Please recall if abnormal findings noted on EEG. Management of EtOH withdrawal per primary team as you are. -- Amie Portland, MD [facility] Pager: [phone] If 7pm to 7am, please call on call as listed on AMION.

## 2019-02-25 NOTE — Evaluation (Addendum)
Clinical/Bedside Swallow Evaluation Patient Details  Name: Edwin Torres MRN: [id number] Date of Birth: 02/13/1956  Today's Date: 02/25/2019 Time: SLP Start Time (ACUTE ONLY): 1055 SLP Stop Time (ACUTE ONLY): 1115 SLP Time Calculation (min) (ACUTE ONLY): 20 min  Past Medical History:  Past Medical History:  Diagnosis Date  . Alcoholic ([place])   . Seizures ([place])    Past Surgical History:  Past Surgical History:  Procedure Laterality Date  . VASECTOMY     HPI:  Edwin Torres is a 64 y.o. male with medical history significant of alcohol abuse, tobacco abuse brought by EMS and [facility] Department to emergency department for evaluation of recent fall MRI 02/25/19 indicated No acute intracranial abnormality. 2. Mildly advanced age-related cerebral atrophy with chronic small vessel ischemic disease. 3. Evolving left parietal scalp contusion with skin staples in place. 4. Bilateral mastoid effusions, right greater than left  Assessment / Plan / Recommendation Clinical Impression  Pt exhibits acute reversible dysphagia characterized by generalized weakness with impaired mastication/prolonged oral prep/transit; no overt s/s of aspiration noted during consumption; pt would benefit from Dysphagia 3/thin liquid diet with general swallowing precautions with ST f/u for diet tolerance and potential upgrade to Regular if pt able; ST will f/u while in acute setting.  Thank you for this consult. SLP Visit Diagnosis: Dysphagia, unspecified (R13.10)    Aspiration Risk  Mild aspiration risk    Diet Recommendation   Dysphagia 3 (mechanical soft)/thin liquids  Medication Administration: Whole meds with liquid    Other  Recommendations Oral Care Recommendations: Oral care BID   Follow up Recommendations None      Frequency and Duration min 1 x/week  1 week       Prognosis Prognosis for Safe Diet Advancement: Good      Swallow Study   General Date of Onset: 02/22/19 HPI: Edwin Torres is a 64 y.o. male with medical history significant of alcohol abuse, tobacco abuse brought by EMS and [employer] to emergency department for evaluation of recent fall Type of Study: Bedside Swallow Evaluation Previous Swallow Assessment: n/a Diet Prior to this Study: NPO Temperature Spikes Noted: No Respiratory Status: Nasal cannula History of Recent Intubation: No Behavior/Cognition: Alert;Cooperative Oral Cavity Assessment: Dry Oral Care Completed by SLP: Yes Oral Cavity - Dentition: Poor condition;Missing dentition Vision: Functional for self-feeding Self-Feeding Abilities: Able to feed self;Needs assist;Needs set up Patient Positioning: Upright in bed Baseline Vocal Quality: Low vocal intensity Volitional Cough: Strong Volitional Swallow: Able to elicit    Oral/Motor/Sensory Function Overall Oral Motor/Sensory Function: Within functional limits   Ice Chips Ice chips: Within functional limits Presentation: Spoon   Thin Liquid Thin Liquid: Within functional limits Presentation: Cup;Straw;Spoon    Nectar Thick Nectar Thick Liquid: Not tested   Honey Thick Honey Thick Liquid: Not tested   Puree Puree: Within functional limits Presentation: Spoon   Solid     Solid: Impaired Presentation: Self Fed Oral Phase Impairments: Impaired mastication Oral Phase Functional Implications: Prolonged oral transit;Impaired mastication      Elvina Sidle, M.S., CCC-SLP 02/25/2019,12:51 PM

## 2019-02-25 NOTE — Procedures (Signed)
Patient Name: Edwin Torres  MRN: [id number]  Epilepsy Attending: Lora Havens  Referring Physician/Provider: Dr. Amie [place] Date: 02/25/2019 Duration: 23.45 minutes  Patient history: 64 year old male with history of alcohol use and alcohol-related seizures asymmetry of Keppra and altered mental status.  EEG to evaluate for seizures.  Level of alertness: awake, asleep  AEDs during EEG study: None  Technical aspects: This EEG study was done with scalp electrodes positioned according to the 10-20 International system of electrode placement. Electrical activity was acquired at a sampling rate of 500Hz  and reviewed with a high frequency filter of 70Hz  and a low frequency filter of 1Hz . EEG data were recorded continuously and digitally stored.   DESCRIPTION:  The posterior dominant rhythm consists of 8-9 Hz activity of moderate voltage (25-35 uV) seen predominantly in posterior head regions, symmetric and reactive to eye opening and eye closing.    Sleep was characterized vertex waves. Hyperventilation and photic stimulation were not performed.  IMPRESSION: This study is within normal limits.  No seizures or epileptiform discharges were seen throughout the recording.  Abcde Oneil Barbra Sarks

## 2019-02-25 NOTE — TOC Progression Note (Signed)
Transition of Care [facility]) - Progression Note    Patient Details  Name: JOSEGUADALUPE BISHARA MRN: [id number] Date of Birth: October 09, 1955  Transition of Care [facility]) CM/SW [place], [place] Phone Number: 02/25/2019, 1:08 PM  Clinical Narrative:     Patient currently doesn't have any bed offers.   CSW will provide bed offers as they become available.   Expected Discharge Plan: [place] Barriers to Discharge: Continued Medical Work up  Expected Discharge Plan and Services Expected Discharge Plan: [place] In-house Referral: NA   Post Acute Care Choice: [place] Living arrangements for the past 2 months: Single Family Home                                       Social Determinants of Health (SDOH) Interventions    Readmission Risk Interventions No flowsheet data found.

## 2019-02-25 NOTE — Progress Notes (Signed)
EEG complete - results pending 

## 2019-02-25 NOTE — Progress Notes (Signed)
PROGRESS NOTE  Edwin Torres [id number] DOB: 1955-09-10 DOA: 02/22/2019 PCP: Patient, No Pcp Per   LOS: 3 days   Brief narrative: As per HPI, Patient is a 64 year old male with past medical history of alcohol and tobacco abuse was brought in on late afternoon of 12/29 by EMS in [place] [employer] for a recent fall that occurred 3 hours prior that led to a large laceration on the left side of his head.  Patient did initially refuse to come in so his wife had called the emergency services and he was brought in.  His last drink of beer had been when the fall occurred.   During hospitalization the nursing staff reported that there was asymmetry in the pupil so code stroke was called in.  Head CT was unremarkable.  Neurology was consulted.    Assessment/Plan:  Principal Problem:   Frequent falls Active Problems:   Alcohol abuse with intoxication (HCC)   Increased anion gap metabolic acidosis   Macrocytic anemia   Tobacco abuse   Physical deconditioning   Alcohol intoxication (HCC)   Thrombocytopenia (HCC)   Scalp laceration   Hypokalemia   Somnolence, daytime  Alcohol abuse with intoxication and withdrawal. Continue CIWA protocol. Denies any hallucinations or tremors at this time. Patient is not motivated to quit alcohol. Physical therapy has seen the patient and recommend skilled nursing facility placement. Case manager on board for placement. On Librium at this time since  Ativan historically (as per the wife) caused agitation and somnolence. Continue thiamine and folic acid. MRI of the brain was performed which did not show any evidence of stroke. CT angiogram of the neck/head   did not show any evidence of large vessel occlusion. EEG showed no evidence of seizures. Patient was seen by speech therapy who recommended dysphagia III diet.  Left parietal scalp hematoma/laceration. Staples in place.  Increased anion gap metabolic acidosis: on presentation, has improved. Likely from alcoholic  ketosis.   Macrocytic anemia: Stable., Hemoglobin of 8.8. Will closely monitor.   Tobacco abuse: Nicotine patch  Physical deconditioning: PT OT on board and recommended skilled nursing facility on discharge.   Thrombocytopenia . likely secondary to alcohol abuse. No evidence of bleeding.  Hypokalemia: Potassium of 3.3 today. Potassium of 2.8 yesterday. Will continue IV potassium with normal saline but decrease rate today.  VTE Prophylaxis: Lovenox subcu  Code Status: Full code  Family Communication: I spoke with the patient's wife on the phone and updated her about the clinical condition of the patient. Patient's wife is very interested in SNF.   Disposition Plan: Skilled nursing facility placement as per physical therapy.  Consultants:  Neurology  Procedures:  EEG  Antibiotics:  Anti-infectives (From admission, onward)   None     Subjective:  Today, patient denies interval complaints. Denies headache nausea vomiting or overt hallucinations or tremors.  Objective: Vitals:   02/25/19 0530 02/25/19 0800  BP: 128/74   Pulse: 60 66  Resp: 18 16  Temp: 98.6 F (37 C)   SpO2: 100% 97%    Intake/Output Summary (Last 24 hours) at 02/25/2019 1352 Last data filed at 02/25/2019 0531 Gross per 24 hour  Intake --  Output 650 ml  Net -650 ml   Filed Weights   02/22/19 0623 02/25/19 0530  Weight: 63.5 kg 56.6 kg   Body mass index is 20.14 kg/m.   Physical Exam: GENERAL: Patient is alert awake and communicative. Not in obvious distress. On nasal cannula oxygen. HENT: No scleral pallor or  icterus. Pupils equally reactive to light. Oral mucosa is moist NECK: is supple, no palpable thyroid enlargement. CHEST: Clear to auscultation. No crackles or wheezes. Non tender on palpation. Diminished breath sounds bilaterally. CVS: S1 and S2 heard, no murmur. Regular rate and rhythm. No pericardial rub. ABDOMEN: Soft, non-tender, bowel sounds are present. EXTREMITIES: No  edema. CNS: Moves all extremities. Slight right eyelid droop. SKIN: warm and dry without rashes.  Data Review: I have personally reviewed the following laboratory data and studies,  CBC: Recent Labs  Lab 02/22/19 0641 02/22/19 1533 02/23/19 0832 02/24/19 1142 02/25/19 0427  WBC 9.1  --  6.8 6.7 7.4  NEUTROABS 6.6  --   --   --   --   HGB 11.9*  --  8.4* 9.0* 8.8*  HCT 34.7* 24.5* 24.5* 25.1* 25.2*  MCV 100.3*  --  100.8* 98.0 99.2  PLT 144*  --  121* 130* 0000000*   Basic Metabolic Panel: Recent Labs  Lab 02/22/19 0641 02/22/19 1626 02/23/19 0832 02/24/19 1142 02/24/19 2033 02/25/19 0427 02/25/19 0901  NA 132*  --  135 137 137  --  137  K 4.1  --  3.5 2.8* 2.9*  --  3.3*  CL 98  --  103 105 104  --  107  CO2 15*  --  24 23 23   --  19*  GLUCOSE 171*  --  87 99 100*  --  75  BUN 5*  --  9 <5* 5*  --  6*  CREATININE 0.91  --  0.61 0.50* 0.55*  --  0.53*  CALCIUM 8.7*  --  8.4* 8.3* 8.0*  --  8.2*  MG  --  1.8  --   --   --  1.9  --   PHOS  --  4.3  --   --   --   --   --    Liver Function Tests: Recent Labs  Lab 02/22/19 0641 02/23/19 0832 02/24/19 2033  AST 47* 44* 45*  ALT 34 26 23  ALKPHOS 114 99 104  BILITOT 0.6 0.9 1.3*  PROT 6.0* 5.2* 5.4*  ALBUMIN 3.2* 2.9* 2.8*   No results for input(s): LIPASE, AMYLASE in the last 168 hours. No results for input(s): AMMONIA in the last 168 hours. Cardiac Enzymes: No results for input(s): CKTOTAL, CKMB, CKMBINDEX, TROPONINI in the last 168 hours. BNP (last 3 results) No results for input(s): BNP in the last 8760 hours.  ProBNP (last 3 results) No results for input(s): PROBNP in the last 8760 hours.  CBG: Recent Labs  Lab 02/23/19 0857 02/23/19 0937 02/24/19 0925 02/24/19 1635 02/25/19 0807  GLUCAP 65* 126* 105* 95 71   Recent Results (from the past 240 hour(s))  SARS CORONAVIRUS 2 (TAT 6-24 HRS) Nasopharyngeal Nasopharyngeal Swab     Status: None   Collection Time: 02/22/19  4:26 PM   Specimen:  Nasopharyngeal Swab  Result Value Ref Range Status   SARS Coronavirus 2 NEGATIVE NEGATIVE Final    Comment: (NOTE) SARS-CoV-2 target nucleic acids are NOT DETECTED. The SARS-CoV-2 RNA is generally detectable in upper and lower respiratory specimens during the acute phase of infection. Negative results do not preclude SARS-CoV-2 infection, do not rule out co-infections with other pathogens, and should not be used as the sole basis for treatment or other patient management decisions. Negative results must be combined with clinical observations, patient history, and epidemiological information. The expected result is Negative. Fact Sheet for Patients: SugarRoll.be Fact Sheet  for Healthcare Providers: https://www.woods-mathews.com/ This test is not yet approved or cleared by the [place] and  has been authorized for detection and/or diagnosis of SARS-CoV-2 by FDA under an Emergency Use Authorization (EUA). This EUA will remain  in effect (meaning this test can be used) for the duration of the COVID-19 declaration under Section 56 4(b)(1) of the Act, 21 U.S.C. section 360bbb-3(b)(1), unless the authorization is terminated or revoked sooner. Performed at [facility], [place] [address], [place], [place] [address]      Studies: CT ANGIO HEAD W OR WO CONTRAST  Result Date: 02/24/2019 CLINICAL DATA:  Nonreactive right pupil EXAM: CT ANGIOGRAPHY HEAD AND NECK CT PERFUSION BRAIN TECHNIQUE: Multidetector CT imaging of the head and neck was performed using the standard protocol during bolus administration of intravenous contrast. Multiplanar CT image reconstructions and MIPs were obtained to evaluate the vascular anatomy. Carotid stenosis measurements (when applicable) are obtained utilizing NASCET criteria, using the distal internal carotid diameter as the denominator. Multiphase CT imaging of the brain was performed following IV bolus contrast  injection. Subsequent parametric perfusion maps were calculated using RAPID software. CONTRAST:  169mL OMNIPAQUE IOHEXOL 350 MG/ML SOLN COMPARISON:  2018 FINDINGS: CTA NECK FINDINGS Aortic arch: Great vessel origins are patent. There is direct origin of the left vertebral artery from the arch. Right carotid system: Patent. Primarily calcified plaque at the carotid bifurcation and proximal internal carotid causing less than 50% stenosis. Left carotid system: Patent. Primarily calcified plaque at the bifurcation and proximal internal carotid causing less 50% stenosis. Vertebral arteries: Patent. Skeleton: Advanced multilevel degenerative changes of the cervical spine. Other neck: No neck mass or adenopathy. Upper chest: No apical lung mass. Review of the MIP images confirms the above findings CTA HEAD FINDINGS Anterior circulation: Intracranial internal carotid arteries are patent with mild calcified plaque. Anterior and middle cerebral arteries are patent. Posterior circulation: Intracranial vertebral arteries, basilar artery, and posterior cerebral arteries are patent. A right posterior communicating artery is identified. Venous sinuses: As permitted by contrast timing, patent. Anatomic variants: Fetal or near fetal origin of the right posterior cerebral artery. Review of the MIP images confirms the above findings CT Brain Perfusion Findings: ASPECTS: 10 CBF (<30%) Volume: 50mL Perfusion (Tmax>6.0s) volume: 92mL Mismatch Volume: 58mL Infarction Location:None IMPRESSION: No large vessel occlusion. No regional perfusion deficit or territory at risk by perfusion imaging. Plaque at the ICA origins causing less than 50% stenosis. These results were communicated to Dr. Rory Percy at [place] 12/31/2020by text page via the [facility] messaging system. Electronically Signed   By: Macy Mis M.D.   On: 02/24/2019 16:28   CT ANGIO NECK W OR WO CONTRAST  Result Date: 02/24/2019 CLINICAL DATA:  Nonreactive right pupil EXAM: CT  ANGIOGRAPHY HEAD AND NECK CT PERFUSION BRAIN TECHNIQUE: Multidetector CT imaging of the head and neck was performed using the standard protocol during bolus administration of intravenous contrast. Multiplanar CT image reconstructions and MIPs were obtained to evaluate the vascular anatomy. Carotid stenosis measurements (when applicable) are obtained utilizing NASCET criteria, using the distal internal carotid diameter as the denominator. Multiphase CT imaging of the brain was performed following IV bolus contrast injection. Subsequent parametric perfusion maps were calculated using RAPID software. CONTRAST:  147mL OMNIPAQUE IOHEXOL 350 MG/ML SOLN COMPARISON:  2018 FINDINGS: CTA NECK FINDINGS Aortic arch: Great vessel origins are patent. There is direct origin of the left vertebral artery from the arch. Right carotid system: Patent. Primarily calcified plaque at the carotid bifurcation  and proximal internal carotid causing less than 50% stenosis. Left carotid system: Patent. Primarily calcified plaque at the bifurcation and proximal internal carotid causing less 50% stenosis. Vertebral arteries: Patent. Skeleton: Advanced multilevel degenerative changes of the cervical spine. Other neck: No neck mass or adenopathy. Upper chest: No apical lung mass. Review of the MIP images confirms the above findings CTA HEAD FINDINGS Anterior circulation: Intracranial internal carotid arteries are patent with mild calcified plaque. Anterior and middle cerebral arteries are patent. Posterior circulation: Intracranial vertebral arteries, basilar artery, and posterior cerebral arteries are patent. A right posterior communicating artery is identified. Venous sinuses: As permitted by contrast timing, patent. Anatomic variants: Fetal or near fetal origin of the right posterior cerebral artery. Review of the MIP images confirms the above findings CT Brain Perfusion Findings: ASPECTS: 10 CBF (<30%) Volume: 33mL Perfusion (Tmax>6.0s) volume:  60mL Mismatch Volume: 55mL Infarction Location:None IMPRESSION: No large vessel occlusion. No regional perfusion deficit or territory at risk by perfusion imaging. Plaque at the ICA origins causing less than 50% stenosis. These results were communicated to Dr. Rory Percy at [place] 12/31/2020by text page via the [facility] messaging system. Electronically Signed   By: Macy Mis M.D.   On: 02/24/2019 16:28   MR BRAIN WO CONTRAST  Result Date: 02/25/2019 CLINICAL DATA:  Initial evaluation for acute encephalopathy. EXAM: MRI HEAD WITHOUT CONTRAST TECHNIQUE: Multiplanar, multiecho pulse sequences of the brain and surrounding structures were obtained without intravenous contrast. COMPARISON:  Prior CTA and CT perfusion from 02/24/2019. FINDINGS: Brain: Diffuse prominence of the CSF containing spaces compatible with generalized age-related cerebral atrophy. Mild chronic microvascular ischemic change present within the periventricular white matter. No abnormal foci of restricted diffusion to suggest acute or subacute ischemia. Gray-white matter differentiation maintained. No encephalomalacia to suggest chronic cortical infarction. No foci of susceptibility artifact to suggest acute or chronic intracranial hemorrhage. No mass lesion, midline shift or mass effect. Mild diffuse ventricular prominence related to global parenchymal volume loss of hydrocephalus. No extra-axial fluid collection. Pituitary gland suprasellar region normal. Midline structures intact. Vascular: Major intracranial vascular flow voids are maintained. Skull and upper cervical spine: Craniocervical junction within normal limits. Degenerative spondylosis noted at C3-4 with resultant mild spinal stenosis. Bone marrow signal intensity within normal limits. Evolving left parietal scalp hematoma with skin staples in place. Sinuses/Orbits: Globes and orbital soft tissues demonstrate no acute finding. Paranasal sinuses are largely clear. Right greater than left  mastoid effusions noted. Visualized nasopharynx within normal limits. Other: None. IMPRESSION: 1. No acute intracranial abnormality. 2. Mildly advanced age-related cerebral atrophy with chronic small vessel ischemic disease. 3. Evolving left parietal scalp contusion with skin staples in place. 4. Bilateral mastoid effusions, right greater than left. Electronically Signed   By: Jeannine Boga M.D.   On: 02/25/2019 01:17   CT CEREBRAL PERFUSION W CONTRAST  Result Date: 02/24/2019 CLINICAL DATA:  Nonreactive right pupil EXAM: CT ANGIOGRAPHY HEAD AND NECK CT PERFUSION BRAIN TECHNIQUE: Multidetector CT imaging of the head and neck was performed using the standard protocol during bolus administration of intravenous contrast. Multiplanar CT image reconstructions and MIPs were obtained to evaluate the vascular anatomy. Carotid stenosis measurements (when applicable) are obtained utilizing NASCET criteria, using the distal internal carotid diameter as the denominator. Multiphase CT imaging of the brain was performed following IV bolus contrast injection. Subsequent parametric perfusion maps were calculated using RAPID software. CONTRAST:  139mL OMNIPAQUE IOHEXOL 350 MG/ML SOLN COMPARISON:  2018 FINDINGS: CTA NECK FINDINGS Aortic arch: Great vessel origins are  patent. There is direct origin of the left vertebral artery from the arch. Right carotid system: Patent. Primarily calcified plaque at the carotid bifurcation and proximal internal carotid causing less than 50% stenosis. Left carotid system: Patent. Primarily calcified plaque at the bifurcation and proximal internal carotid causing less 50% stenosis. Vertebral arteries: Patent. Skeleton: Advanced multilevel degenerative changes of the cervical spine. Other neck: No neck mass or adenopathy. Upper chest: No apical lung mass. Review of the MIP images confirms the above findings CTA HEAD FINDINGS Anterior circulation: Intracranial internal carotid arteries are  patent with mild calcified plaque. Anterior and middle cerebral arteries are patent. Posterior circulation: Intracranial vertebral arteries, basilar artery, and posterior cerebral arteries are patent. A right posterior communicating artery is identified. Venous sinuses: As permitted by contrast timing, patent. Anatomic variants: Fetal or near fetal origin of the right posterior cerebral artery. Review of the MIP images confirms the above findings CT Brain Perfusion Findings: ASPECTS: 10 CBF (<30%) Volume: 37mL Perfusion (Tmax>6.0s) volume: 25mL Mismatch Volume: 32mL Infarction Location:None IMPRESSION: No large vessel occlusion. No regional perfusion deficit or territory at risk by perfusion imaging. Plaque at the ICA origins causing less than 50% stenosis. These results were communicated to Dr. Rory Percy at [place] 12/31/2020by text page via the [facility] messaging system. Electronically Signed   By: Macy Mis M.D.   On: 02/24/2019 16:28   EEG adult  Result Date: 02/25/2019 Lora Havens, MD     02/25/2019 12:28 PM Patient Name: INGRID YANIK MRN: [id number] Epilepsy Attending: Lora Havens Referring Physician/Provider: Dr. Amie [place] Date: 02/25/2019 Duration: 23.45 minutes Patient history: 64 year old male with history of alcohol use and alcohol-related seizures asymmetry of Keppra and altered mental status.  EEG to evaluate for seizures. Level of alertness: awake, asleep AEDs during EEG study: None Technical aspects: This EEG study was done with scalp electrodes positioned according to the 10-20 International system of electrode placement. Electrical activity was acquired at a sampling rate of 500Hz  and reviewed with a high frequency filter of 70Hz  and a low frequency filter of 1Hz . EEG data were recorded continuously and digitally stored. DESCRIPTION:  The posterior dominant rhythm consists of 8-9 Hz activity of moderate voltage (25-35 uV) seen predominantly in posterior head regions, symmetric and  reactive to eye opening and eye closing.    Sleep was characterized vertex waves. Hyperventilation and photic stimulation were not performed. IMPRESSION: This study is within normal limits.  No seizures or epileptiform discharges were seen throughout the recording. Lora Havens   CT HEAD CODE STROKE WO CONTRAST  Result Date: 02/24/2019 CLINICAL DATA:  Code stroke.  Right pupil nonreactive EXAM: CT HEAD WITHOUT CONTRAST TECHNIQUE: Contiguous axial images were obtained from the base of the skull through the vertex without intravenous contrast. COMPARISON:  02/22/2019 FINDINGS: Brain: There is no acute intracranial hemorrhage, mass effect, or edema. Gray-white differentiation is preserved. Prominence of the ventricles and sulci reflects stable generalized parenchymal volume loss. Patchy hypoattenuation in the supratentorial white matter is nonspecific but likely reflects stable chronic microvascular ischemic changes. Vascular: No hyperdense vessel. There is intracranial atherosclerotic calcification at the skull base. Skull: Unremarkable. Sinuses/Orbits: Trace mucosal thickening. Other: Minor patchy mastoid opacification. Similar left scalp hematoma. ASPECTS [facility] Stroke Program Early CT Score) - Ganglionic level infarction (caudate, lentiform nuclei, internal capsule, insula, M1-M3 cortex): 7 - Supraganglionic infarction (M4-M6 cortex): 3 Total score (0-10 with 10 being normal): 10 IMPRESSION: 1. No acute intracranial hemorrhage or evidence of acute infarction. Stable chronic  findings detailed above. 2. ASPECTS is 10 These results were communicated to Dr. Rory Percy at 4:04 pmon 12/31/2020by text page via the [facility] messaging system. Electronically Signed   By: Macy Mis M.D.   On: 02/24/2019 16:05    Scheduled Meds: . folic acid  1 mg Intravenous Daily  . mouth rinse  15 mL Mouth Rinse BID  . multivitamin with minerals  1 tablet Oral Daily  . potassium chloride  40 mEq Oral Once  . thiamine  injection  100 mg Intravenous Daily    Continuous Infusions: . 0.9 % NaCl with KCl 40 mEq / L 100 mL/hr (02/25/19 0840)  . dextrose 5 % and 0.9% NaCl 50 mL/hr at 02/24/19 0504     Flora Lipps, MD  [facility] 02/25/2019

## 2019-02-26 LAB — CBC
HCT: 24.1 % — ABNORMAL LOW (ref 39.0–52.0)
Hemoglobin: 8.4 g/dL — ABNORMAL LOW (ref 13.0–17.0)
MCH: 34.3 pg — ABNORMAL HIGH (ref 26.0–34.0)
MCHC: 34.9 g/dL (ref 30.0–36.0)
MCV: 98.4 fL (ref 80.0–100.0)
Platelets: 153 10*3/uL (ref 150–400)
RBC: 2.45 MIL/uL — ABNORMAL LOW (ref 4.22–5.81)
RDW: 12.4 % (ref 11.5–15.5)
WBC: 6 10*3/uL (ref 4.0–10.5)
nRBC: 0 % (ref 0.0–0.2)

## 2019-02-26 LAB — COMPREHENSIVE METABOLIC PANEL
ALT: 21 U/L (ref 0–44)
AST: 37 U/L (ref 15–41)
Albumin: 2.7 g/dL — ABNORMAL LOW (ref 3.5–5.0)
Alkaline Phosphatase: 87 U/L (ref 38–126)
Anion gap: 10 (ref 5–15)
BUN: 7 mg/dL — ABNORMAL LOW (ref 8–23)
CO2: 20 mmol/L — ABNORMAL LOW (ref 22–32)
Calcium: 8.2 mg/dL — ABNORMAL LOW (ref 8.9–10.3)
Chloride: 104 mmol/L (ref 98–111)
Creatinine, Ser: 0.48 mg/dL — ABNORMAL LOW (ref 0.61–1.24)
GFR calc Af Amer: >60 mL/min (ref >60)
GFR calc non Af Amer: >60 mL/min (ref >60)
Glucose, Bld: 95 mg/dL (ref 70–99)
Potassium: 3.7 mmol/L (ref 3.5–5.1)
Sodium: 134 mmol/L — ABNORMAL LOW (ref 135–145)
Total Bilirubin: 1.1 mg/dL (ref 0.3–1.2)
Total Protein: 5.3 g/dL — ABNORMAL LOW (ref 6.5–8.1)

## 2019-02-26 LAB — GLUCOSE, CAPILLARY: Glucose-Capillary: 92 mg/dL (ref 70–99)

## 2019-02-26 LAB — PHOSPHORUS: Phosphorus: 2.8 mg/dL (ref 2.5–4.6)

## 2019-02-26 LAB — MAGNESIUM: Magnesium: 1.8 mg/dL (ref 1.7–2.4)

## 2019-02-26 NOTE — Progress Notes (Signed)
  Speech Language Pathology Treatment: Dysphagia  Patient Details Name: Edwin Torres MRN: [id number] DOB: 09/17/1955 Today's Date: 02/26/2019 Time: 1222-1237 SLP Time Calculation (min) (ACUTE ONLY): 15 min  Assessment / Plan / Recommendation Clinical Impression  Pt was encountered awake/alert with wife present at bedside.  He reported that he was tolerating his current diet without difficulty.  Pt was observed with trials of thin liquid and regular solids.  He exhibited prolonged mastication and AP transport with regular solids; however, mastication was effective and no oral residue was observed.  Pt exhibited a delayed, dry cough following 2/3 thin liquid trials.  Suspect esophageal etiology vs pharyngeal dysphagia.  No additional clinical s/sx of aspiration were observed with any trials.  Recommend continuation of Dysphagia 3 (soft) solids and thin liquid with the following precautions: 1) Small bites/sips 2) Slow rate of intake 3) Sit upright 90 degrees.  SLP will briefly f/u to monitor diet tolerance and to evaluate readiness for clinical diet upgrade.     HPI HPI: Edwin Torres is a 64 y.o. male with medical history significant of alcohol abuse, tobacco abuse brought by EMS and [facility] to emergency department for evaluation of recent fall      SLP Plan  Continue with current plan of care       Recommendations  Diet recommendations: Thin liquid;Dysphagia 3 (mechanical soft) Liquids provided via: Straw;Cup Medication Administration: Whole meds with liquid Supervision: Patient able to self feed;Intermittent supervision to cue for compensatory strategies Compensations: Slow rate;Small sips/bites Postural Changes and/or Swallow Maneuvers: Seated upright 90 degrees                Oral Care Recommendations: Oral care BID Follow up Recommendations: None SLP Visit Diagnosis: Dysphagia, unspecified (R13.10) Plan: Continue with current plan of care                       Colin Mulders M.S., [place] Office: [phone]  Elvia Collum [facility] 02/26/2019, 12:43 PM

## 2019-02-26 NOTE — Progress Notes (Signed)
PROGRESS NOTE  MURICE Torres [id number] DOB: 1956-01-18 DOA: 02/22/2019 PCP: Patient, No Pcp Per   LOS: 4 days   Brief narrative: As per HPI, Patient is a 64 year old male with past medical history of alcohol and tobacco abuse was brought in on late afternoon of 12/29 by EMS in [place] [employer] for a recent fall that occurred 3 hours prior that led to a large laceration on the left side of his head.  Patient did initially refuse to come in so his wife had called the emergency services and he was brought in.  His last drink of beer had been when the fall occurred.   During hospitalization the nursing staff reported that there was asymmetry in the pupil so code stroke was called in.  Head CT was unremarkable.  Neurology was consulted.    Assessment/Plan:  Principal Problem:   Frequent falls Active Problems:   Alcohol abuse with intoxication (HCC)   Increased anion gap metabolic acidosis   Macrocytic anemia   Tobacco abuse   Physical deconditioning   Alcohol intoxication (HCC)   Thrombocytopenia (HCC)   Scalp laceration   Hypokalemia   Somnolence, daytime  Alcohol abuse with intoxication and withdrawal. Continue CIWA protocol. Denies any hallucinations or tremors at this time. Patient is not motivated to quit alcohol. Physical therapy has seen the patient and recommend skilled nursing facility placement. Case manager on board for placement. On Librium at this time since  Ativan historically (as per the wife) caused agitation and somnolence. Continue thiamine and folic acid. MRI of the brain was performed which did not show any evidence of stroke. CT angiogram of the neck/head did not show any evidence of large vessel occlusion. EEG showed no evidence of seizures. Patient was seen by speech therapy who recommended dysphagia III diet.  Left parietal scalp hematoma/laceration. Staples in place.  Increased anion gap metabolic acidosis: on presentation, has improved. Likely from alcoholic  ketosis.   Macrocytic anemia: Stable., Hemoglobin of 8.4. Will closely monitor.   Tobacco abuse: Continue nicotine patch  Physical deconditioning: PT OT on board and recommended skilled nursing facility on discharge.   Thrombocytopenia . likely secondary to alcohol abuse.  Improved.  Platelet 153.  No evidence of bleeding.  Hypokalemia: Improved with IV potassium. Will decrease IV potassium with normal saline   VTE Prophylaxis: Lovenox subcu  Code Status: Full code  Family Communication:  None today.   Disposition Plan: Skilled nursing facility placement as per physical therapy.  Consultants:  Neurology  Procedures:  EEG  Antibiotics:  Anti-infectives (From admission, onward)   None     Subjective:  Today, patient denies interval complaints.  Denies nausea vomiting hallucinations or tremors.  Objective: Vitals:   02/26/19 1110 02/26/19 1230  BP:  (!) 120/92  Pulse: 60 63  Resp: 11 13  Temp:    SpO2: 100% 100%    Intake/Output Summary (Last 24 hours) at 02/26/2019 1325 Last data filed at 02/26/2019 0600 Gross per 24 hour  Intake 2161.27 ml  Output 775 ml  Net 1386.27 ml   Filed Weights   02/22/19 0623 02/25/19 0530 02/26/19 0413  Weight: 63.5 kg 56.6 kg 56.9 kg   Body mass index is 20.25 kg/m.   Physical Exam: GENERAL: Patient is alert awake and communicative. Not in obvious distress. On nasal cannula oxygen. HENT: No scleral pallor or icterus. Pupils equally reactive to light. Oral mucosa is moist NECK: is supple, no palpable thyroid enlargement. CHEST: Clear to auscultation. Diminished breath sounds  bilaterally. CVS: S1 and S2 heard, no murmur. Regular rate and rhythm. No pericardial rub. ABDOMEN: Soft, non-tender, bowel sounds are present. EXTREMITIES: No edema. CNS: Moves all extremities. Slight right eyelid droop. SKIN: warm and dry without rashes.  Data Review: I have personally reviewed the following laboratory data and  studies,  CBC: Recent Labs  Lab 02/22/19 0641 02/22/19 1533 02/23/19 0832 02/24/19 1142 02/25/19 0427 02/26/19 0314  WBC 9.1  --  6.8 6.7 7.4 6.0  NEUTROABS 6.6  --   --   --   --   --   HGB 11.9*  --  8.4* 9.0* 8.8* 8.4*  HCT 34.7* 24.5* 24.5* 25.1* 25.2* 24.1*  MCV 100.3*  --  100.8* 98.0 99.2 98.4  PLT 144*  --  121* 130* 138* 0000000   Basic Metabolic Panel: Recent Labs  Lab 02/22/19 1626 02/23/19 0832 02/24/19 1142 02/24/19 2033 02/25/19 0427 02/25/19 0901 02/26/19 0314  NA  --  135 137 137  --  137 134*  K  --  3.5 2.8* 2.9*  --  3.3* 3.7  CL  --  103 105 104  --  107 104  CO2  --  24 23 23   --  19* 20*  GLUCOSE  --  87 99 100*  --  75 95  BUN  --  9 <5* 5*  --  6* 7*  CREATININE  --  0.61 0.50* 0.55*  --  0.53* 0.48*  CALCIUM  --  8.4* 8.3* 8.0*  --  8.2* 8.2*  MG 1.8  --   --   --  1.9  --  1.8  PHOS 4.3  --   --   --   --   --  2.8   Liver Function Tests: Recent Labs  Lab 02/22/19 0641 02/23/19 0832 02/24/19 2033 02/26/19 0314  AST 47* 44* 45* 37  ALT 34 26 23 21   ALKPHOS 114 99 104 87  BILITOT 0.6 0.9 1.3* 1.1  PROT 6.0* 5.2* 5.4* 5.3*  ALBUMIN 3.2* 2.9* 2.8* 2.7*   No results for input(s): LIPASE, AMYLASE in the last 168 hours. No results for input(s): AMMONIA in the last 168 hours. Cardiac Enzymes: No results for input(s): CKTOTAL, CKMB, CKMBINDEX, TROPONINI in the last 168 hours. BNP (last 3 results) No results for input(s): BNP in the last 8760 hours.  ProBNP (last 3 results) No results for input(s): PROBNP in the last 8760 hours.  CBG: Recent Labs  Lab 02/23/19 0937 02/24/19 0925 02/24/19 1635 02/25/19 0807 02/26/19 0736  GLUCAP 126* 105* 95 71 92   Recent Results (from the past 240 hour(s))  SARS CORONAVIRUS 2 (TAT 6-24 HRS) Nasopharyngeal Nasopharyngeal Swab     Status: None   Collection Time: 02/22/19  4:26 PM   Specimen: Nasopharyngeal Swab  Result Value Ref Range Status   SARS Coronavirus 2 NEGATIVE NEGATIVE Final     Comment: (NOTE) SARS-CoV-2 target nucleic acids are NOT DETECTED. The SARS-CoV-2 RNA is generally detectable in upper and lower respiratory specimens during the acute phase of infection. Negative results do not preclude SARS-CoV-2 infection, do not rule out co-infections with other pathogens, and should not be used as the sole basis for treatment or other patient management decisions. Negative results must be combined with clinical observations, patient history, and epidemiological information. The expected result is Negative. Fact Sheet for Patients: SugarRoll.be Fact Sheet for Healthcare Providers: https://www.woods-mathews.com/ This test is not yet approved or cleared by the [place] FDA and  has been authorized for detection and/or diagnosis of SARS-CoV-2 by FDA under an Emergency Use Authorization (EUA). This EUA will remain  in effect (meaning this test can be used) for the duration of the COVID-19 declaration under Section 56 4(b)(1) of the Act, 21 U.S.C. section 360bbb-3(b)(1), unless the authorization is terminated or revoked sooner. Performed at [facility], [place] [address] [place], [place] [address]      Studies: CT ANGIO HEAD W OR WO CONTRAST  Result Date: 02/24/2019 CLINICAL DATA:  Nonreactive right pupil EXAM: CT ANGIOGRAPHY HEAD AND NECK CT PERFUSION BRAIN TECHNIQUE: Multidetector CT imaging of the head and neck was performed using the standard protocol during bolus administration of intravenous contrast. Multiplanar CT image reconstructions and MIPs were obtained to evaluate the vascular anatomy. Carotid stenosis measurements (when applicable) are obtained utilizing NASCET criteria, using the distal internal carotid diameter as the denominator. Multiphase CT imaging of the brain was performed following IV bolus contrast injection. Subsequent parametric perfusion maps were calculated using RAPID software. CONTRAST:   164mL OMNIPAQUE IOHEXOL 350 MG/ML SOLN COMPARISON:  2018 FINDINGS: CTA NECK FINDINGS Aortic arch: Great vessel origins are patent. There is direct origin of the left vertebral artery from the arch. Right carotid system: Patent. Primarily calcified plaque at the carotid bifurcation and proximal internal carotid causing less than 50% stenosis. Left carotid system: Patent. Primarily calcified plaque at the bifurcation and proximal internal carotid causing less 50% stenosis. Vertebral arteries: Patent. Skeleton: Advanced multilevel degenerative changes of the cervical spine. Other neck: No neck mass or adenopathy. Upper chest: No apical lung mass. Review of the MIP images confirms the above findings CTA HEAD FINDINGS Anterior circulation: Intracranial internal carotid arteries are patent with mild calcified plaque. Anterior and middle cerebral arteries are patent. Posterior circulation: Intracranial vertebral arteries, basilar artery, and posterior cerebral arteries are patent. A right posterior communicating artery is identified. Venous sinuses: As permitted by contrast timing, patent. Anatomic variants: Fetal or near fetal origin of the right posterior cerebral artery. Review of the MIP images confirms the above findings CT Brain Perfusion Findings: ASPECTS: 10 CBF (<30%) Volume: 29mL Perfusion (Tmax>6.0s) volume: 45mL Mismatch Volume: 50mL Infarction Location:None IMPRESSION: No large vessel occlusion. No regional perfusion deficit or territory at risk by perfusion imaging. Plaque at the ICA origins causing less than 50% stenosis. These results were communicated to Dr. Rory Percy at [place] 12/31/2020by text page via the [facility] messaging system. Electronically Signed   By: Macy Mis M.D.   On: 02/24/2019 16:28   CT ANGIO NECK W OR WO CONTRAST  Result Date: 02/24/2019 CLINICAL DATA:  Nonreactive right pupil EXAM: CT ANGIOGRAPHY HEAD AND NECK CT PERFUSION BRAIN TECHNIQUE: Multidetector CT imaging of the head and  neck was performed using the standard protocol during bolus administration of intravenous contrast. Multiplanar CT image reconstructions and MIPs were obtained to evaluate the vascular anatomy. Carotid stenosis measurements (when applicable) are obtained utilizing NASCET criteria, using the distal internal carotid diameter as the denominator. Multiphase CT imaging of the brain was performed following IV bolus contrast injection. Subsequent parametric perfusion maps were calculated using RAPID software. CONTRAST:  115mL OMNIPAQUE IOHEXOL 350 MG/ML SOLN COMPARISON:  2018 FINDINGS: CTA NECK FINDINGS Aortic arch: Great vessel origins are patent. There is direct origin of the left vertebral artery from the arch. Right carotid system: Patent. Primarily calcified plaque at the carotid bifurcation and proximal internal carotid causing less than 50% stenosis. Left carotid system: Patent. Primarily calcified plaque at the bifurcation  and proximal internal carotid causing less 50% stenosis. Vertebral arteries: Patent. Skeleton: Advanced multilevel degenerative changes of the cervical spine. Other neck: No neck mass or adenopathy. Upper chest: No apical lung mass. Review of the MIP images confirms the above findings CTA HEAD FINDINGS Anterior circulation: Intracranial internal carotid arteries are patent with mild calcified plaque. Anterior and middle cerebral arteries are patent. Posterior circulation: Intracranial vertebral arteries, basilar artery, and posterior cerebral arteries are patent. A right posterior communicating artery is identified. Venous sinuses: As permitted by contrast timing, patent. Anatomic variants: Fetal or near fetal origin of the right posterior cerebral artery. Review of the MIP images confirms the above findings CT Brain Perfusion Findings: ASPECTS: 10 CBF (<30%) Volume: 92mL Perfusion (Tmax>6.0s) volume: 26mL Mismatch Volume: 56mL Infarction Location:None IMPRESSION: No large vessel occlusion. No  regional perfusion deficit or territory at risk by perfusion imaging. Plaque at the ICA origins causing less than 50% stenosis. These results were communicated to Dr. Rory Percy at [place] 12/31/2020by text page via the [facility] messaging system. Electronically Signed   By: Macy Mis M.D.   On: 02/24/2019 16:28   MR BRAIN WO CONTRAST  Result Date: 02/25/2019 CLINICAL DATA:  Initial evaluation for acute encephalopathy. EXAM: MRI HEAD WITHOUT CONTRAST TECHNIQUE: Multiplanar, multiecho pulse sequences of the brain and surrounding structures were obtained without intravenous contrast. COMPARISON:  Prior CTA and CT perfusion from 02/24/2019. FINDINGS: Brain: Diffuse prominence of the CSF containing spaces compatible with generalized age-related cerebral atrophy. Mild chronic microvascular ischemic change present within the periventricular white matter. No abnormal foci of restricted diffusion to suggest acute or subacute ischemia. Gray-white matter differentiation maintained. No encephalomalacia to suggest chronic cortical infarction. No foci of susceptibility artifact to suggest acute or chronic intracranial hemorrhage. No mass lesion, midline shift or mass effect. Mild diffuse ventricular prominence related to global parenchymal volume loss of hydrocephalus. No extra-axial fluid collection. Pituitary gland suprasellar region normal. Midline structures intact. Vascular: Major intracranial vascular flow voids are maintained. Skull and upper cervical spine: Craniocervical junction within normal limits. Degenerative spondylosis noted at C3-4 with resultant mild spinal stenosis. Bone marrow signal intensity within normal limits. Evolving left parietal scalp hematoma with skin staples in place. Sinuses/Orbits: Globes and orbital soft tissues demonstrate no acute finding. Paranasal sinuses are largely clear. Right greater than left mastoid effusions noted. Visualized nasopharynx within normal limits. Other: None.  IMPRESSION: 1. No acute intracranial abnormality. 2. Mildly advanced age-related cerebral atrophy with chronic small vessel ischemic disease. 3. Evolving left parietal scalp contusion with skin staples in place. 4. Bilateral mastoid effusions, right greater than left. Electronically Signed   By: Jeannine Boga M.D.   On: 02/25/2019 01:17   CT CEREBRAL PERFUSION W CONTRAST  Result Date: 02/24/2019 CLINICAL DATA:  Nonreactive right pupil EXAM: CT ANGIOGRAPHY HEAD AND NECK CT PERFUSION BRAIN TECHNIQUE: Multidetector CT imaging of the head and neck was performed using the standard protocol during bolus administration of intravenous contrast. Multiplanar CT image reconstructions and MIPs were obtained to evaluate the vascular anatomy. Carotid stenosis measurements (when applicable) are obtained utilizing NASCET criteria, using the distal internal carotid diameter as the denominator. Multiphase CT imaging of the brain was performed following IV bolus contrast injection. Subsequent parametric perfusion maps were calculated using RAPID software. CONTRAST:  181mL OMNIPAQUE IOHEXOL 350 MG/ML SOLN COMPARISON:  2018 FINDINGS: CTA NECK FINDINGS Aortic arch: Great vessel origins are patent. There is direct origin of the left vertebral artery from the arch. Right carotid system: Patent. Primarily calcified  plaque at the carotid bifurcation and proximal internal carotid causing less than 50% stenosis. Left carotid system: Patent. Primarily calcified plaque at the bifurcation and proximal internal carotid causing less 50% stenosis. Vertebral arteries: Patent. Skeleton: Advanced multilevel degenerative changes of the cervical spine. Other neck: No neck mass or adenopathy. Upper chest: No apical lung mass. Review of the MIP images confirms the above findings CTA HEAD FINDINGS Anterior circulation: Intracranial internal carotid arteries are patent with mild calcified plaque. Anterior and middle cerebral arteries are patent.  Posterior circulation: Intracranial vertebral arteries, basilar artery, and posterior cerebral arteries are patent. A right posterior communicating artery is identified. Venous sinuses: As permitted by contrast timing, patent. Anatomic variants: Fetal or near fetal origin of the right posterior cerebral artery. Review of the MIP images confirms the above findings CT Brain Perfusion Findings: ASPECTS: 10 CBF (<30%) Volume: 3mL Perfusion (Tmax>6.0s) volume: 27mL Mismatch Volume: 54mL Infarction Location:None IMPRESSION: No large vessel occlusion. No regional perfusion deficit or territory at risk by perfusion imaging. Plaque at the ICA origins causing less than 50% stenosis. These results were communicated to Dr. Rory Percy at [place] 12/31/2020by text page via the [facility] messaging system. Electronically Signed   By: Macy Mis M.D.   On: 02/24/2019 16:28   EEG adult  Result Date: 02/25/2019 Lora Havens, MD     02/25/2019 12:28 PM Patient Name: Edwin Torres MRN: [id number] Epilepsy Attending: Lora Havens Referring Physician/Provider: Dr. Amie [place] Date: 02/25/2019 Duration: 23.45 minutes Patient history: 64 year old male with history of alcohol use and alcohol-related seizures asymmetry of Keppra and altered mental status.  EEG to evaluate for seizures. Level of alertness: awake, asleep AEDs during EEG study: None Technical aspects: This EEG study was done with scalp electrodes positioned according to the 10-20 International system of electrode placement. Electrical activity was acquired at a sampling rate of 500Hz  and reviewed with a high frequency filter of 70Hz  and a low frequency filter of 1Hz . EEG data were recorded continuously and digitally stored. DESCRIPTION:  The posterior dominant rhythm consists of 8-9 Hz activity of moderate voltage (25-35 uV) seen predominantly in posterior head regions, symmetric and reactive to eye opening and eye closing.    Sleep was characterized vertex waves.  Hyperventilation and photic stimulation were not performed. IMPRESSION: This study is within normal limits.  No seizures or epileptiform discharges were seen throughout the recording. Lora Havens   CT HEAD CODE STROKE WO CONTRAST  Result Date: 02/24/2019 CLINICAL DATA:  Code stroke.  Right pupil nonreactive EXAM: CT HEAD WITHOUT CONTRAST TECHNIQUE: Contiguous axial images were obtained from the base of the skull through the vertex without intravenous contrast. COMPARISON:  02/22/2019 FINDINGS: Brain: There is no acute intracranial hemorrhage, mass effect, or edema. Gray-white differentiation is preserved. Prominence of the ventricles and sulci reflects stable generalized parenchymal volume loss. Patchy hypoattenuation in the supratentorial white matter is nonspecific but likely reflects stable chronic microvascular ischemic changes. Vascular: No hyperdense vessel. There is intracranial atherosclerotic calcification at the skull base. Skull: Unremarkable. Sinuses/Orbits: Trace mucosal thickening. Other: Minor patchy mastoid opacification. Similar left scalp hematoma. ASPECTS [facility] Stroke Program Early CT Score) - Ganglionic level infarction (caudate, lentiform nuclei, internal capsule, insula, M1-M3 cortex): 7 - Supraganglionic infarction (M4-M6 cortex): 3 Total score (0-10 with 10 being normal): 10 IMPRESSION: 1. No acute intracranial hemorrhage or evidence of acute infarction. Stable chronic findings detailed above. 2. ASPECTS is 10 These results were communicated to Dr. Rory Percy at 4:04 pmon 12/31/2020by text  page via the [facility] messaging system. Electronically Signed   By: Macy Mis M.D.   On: 02/24/2019 16:05    Scheduled Meds: . enoxaparin (LOVENOX) injection  40 mg Subcutaneous Q24H  . folic acid  1 mg Intravenous Daily  . mouth rinse  15 mL Mouth Rinse BID  . multivitamin with minerals  1 tablet Oral Daily  . thiamine injection  100 mg Intravenous Daily    Continuous Infusions: . 0.9  % NaCl with KCl 40 mEq / L 75 mL/hr (02/26/19 [id number])     Flora Lipps, MD  [facility] 02/26/2019

## 2019-02-26 NOTE — TOC Progression Note (Signed)
Transition of Care [facility]) - Progression Note    Patient Details  Name: Edwin Torres MRN: [id number] Date of Birth: 01-09-1956  Transition of Care [facility]) CM/SW [place],  Phone Number: [phone] 02/26/2019, 2:53 PM  Clinical Narrative:     Patient currently doesn't have any bed offers.   CSW will provide bed offers as they become available.   Expected Discharge Plan: [place] Barriers to Discharge: Continued Medical Work up  Expected Discharge Plan and Services Expected Discharge Plan: [place] In-house Referral: NA   Post Acute Care Choice: [place] Living arrangements for the past 2 months: Single Family Home                                       Social Determinants of Health (SDOH) Interventions    Readmission Risk Interventions No flowsheet data found.

## 2019-02-27 ENCOUNTER — Inpatient Hospital Stay (HOSPITAL_COMMUNITY): Payer: PPO

## 2019-02-27 LAB — CBC
HCT: 27.6 % — ABNORMAL LOW (ref 39.0–52.0)
Hemoglobin: 9.4 g/dL — ABNORMAL LOW (ref 13.0–17.0)
MCH: 34.1 pg — ABNORMAL HIGH (ref 26.0–34.0)
MCHC: 34.1 g/dL (ref 30.0–36.0)
MCV: 100 fL (ref 80.0–100.0)
Platelets: 219 10*3/uL (ref 150–400)
RBC: 2.76 MIL/uL — ABNORMAL LOW (ref 4.22–5.81)
RDW: 12.5 % (ref 11.5–15.5)
WBC: 6.3 10*3/uL (ref 4.0–10.5)
nRBC: 0 % (ref 0.0–0.2)

## 2019-02-27 LAB — COMPREHENSIVE METABOLIC PANEL
ALT: 24 U/L (ref 0–44)
AST: 41 U/L (ref 15–41)
Albumin: 3 g/dL — ABNORMAL LOW (ref 3.5–5.0)
Alkaline Phosphatase: 99 U/L (ref 38–126)
Anion gap: 10 (ref 5–15)
BUN: <5 mg/dL — ABNORMAL LOW (ref 8–23)
CO2: 20 mmol/L — ABNORMAL LOW (ref 22–32)
Calcium: 8.6 mg/dL — ABNORMAL LOW (ref 8.9–10.3)
Chloride: 106 mmol/L (ref 98–111)
Creatinine, Ser: 0.43 mg/dL — ABNORMAL LOW (ref 0.61–1.24)
GFR calc Af Amer: >60 mL/min (ref >60)
GFR calc non Af Amer: >60 mL/min (ref >60)
Glucose, Bld: 93 mg/dL (ref 70–99)
Potassium: 3.7 mmol/L (ref 3.5–5.1)
Sodium: 136 mmol/L (ref 135–145)
Total Bilirubin: 1.1 mg/dL (ref 0.3–1.2)
Total Protein: 6 g/dL — ABNORMAL LOW (ref 6.5–8.1)

## 2019-02-27 LAB — GLUCOSE, CAPILLARY: Glucose-Capillary: 92 mg/dL (ref 70–99)

## 2019-02-27 LAB — PHOSPHORUS: Phosphorus: 3.2 mg/dL (ref 2.5–4.6)

## 2019-02-27 LAB — MAGNESIUM: Magnesium: 1.7 mg/dL (ref 1.7–2.4)

## 2019-02-27 MED ORDER — LOPERAMIDE HCL 2 MG PO CAPS: 2.0000 mg | ORAL_CAPSULE | ORAL | Status: AC | PRN

## 2019-02-27 MED ORDER — THIAMINE HCL 100 MG PO TABS
100.0000 mg | ORAL_TABLET | Freq: Every day | ORAL | Status: DC
  Administered 2019-02-28 – 2019-03-03 (×4): 100 mg via ORAL
  Filled 2019-02-27 (×5): qty 1

## 2019-02-27 MED ORDER — CHLORDIAZEPOXIDE HCL 25 MG PO CAPS
25.0000 mg | ORAL_CAPSULE | Freq: Four times a day (QID) | ORAL | Status: AC | PRN
  Administered 2019-02-27 – 2019-03-01 (×6): 25 mg via ORAL
  Filled 2019-02-27 (×5): qty 1

## 2019-02-27 MED ORDER — POTASSIUM CHLORIDE CRYS ER 20 MEQ PO TBCR
40.0000 meq | EXTENDED_RELEASE_TABLET | Freq: Every day | ORAL | Status: DC
  Administered 2019-02-27: 40 meq via ORAL
  Filled 2019-02-27: qty 2

## 2019-02-27 MED ORDER — NICOTINE 21 MG/24HR TD PT24
21.0000 mg | MEDICATED_PATCH | Freq: Every day | TRANSDERMAL | Status: DC
  Administered 2019-02-27 – 2019-03-03 (×5): 21 mg via TRANSDERMAL
  Filled 2019-02-27 (×5): qty 1

## 2019-02-27 NOTE — Progress Notes (Addendum)
PROGRESS NOTE  ROMER THELANDER [id number] DOB: Mar 10, 1955 DOA: 02/22/2019 PCP: Patient, No Pcp Per   LOS: 5 days   Brief narrative: As per HPI, Patient is a 64 year old male with past medical history of alcohol and tobacco abuse was brought in on late afternoon of 12/29 by EMS in [place] [employer] for a recent fall that occurred 3 hours prior that led to a large laceration on the left side of his head.  Patient did initially refuse to come in so his wife had called the emergency services and he was brought in.  His last drink of beer had been when the fall occurred.   During hospitalization the nursing staff reported that there was asymmetry in the pupil so code stroke was called in.  Head CT was unremarkable.  Neurology was consulted.    Assessment/Plan:  Principal Problem:   Frequent falls Active Problems:   Alcohol abuse with intoxication (HCC)   Increased anion gap metabolic acidosis   Macrocytic anemia   Tobacco abuse   Physical deconditioning   Alcohol intoxication (HCC)   Thrombocytopenia (HCC)   Scalp laceration   Hypokalemia   Somnolence, daytime  Alcohol abuse with intoxication and withdrawal. Improved now. On CIWA protocol. Denies any hallucinations but has some tremors. On Librium. Ativan historically (as per the wife) caused agitation and somnolence. Continue thiamine and folic acid. MRI of the brain was performed which did not show any evidence of stroke. CT angiogram of the neck/head did not show any evidence of large vessel occlusion. EEG showed no evidence of seizures. Patient was seen by speech therapy who recommended dysphagia III diet.  Frequent falls- fall today.  Denies pain.  Add fall precautions.  Seen by physical therapy recommended skilled nursing facility placement.  X-ray of the left knee did not show any acute abnormality.  Left parietal scalp hematoma/laceration. Staples in place.  Increased anion gap metabolic acidosis: on presentation, resolved.   Macrocytic anemia: Stable., Hemoglobin of 9.4 today from 8.4. Will closely monitor.   Tobacco abuse: Continue nicotine patch  Physical deconditioning, debility and falls: PT OT on board and recommended skilled nursing facility on discharge.   Thrombocytopenia .  Resolved.  Hypokalemia: improved with IV potassium.  Potassium 3.7 today.  Put on daily oral potassium.  Nutrition.  Seen by speech therapy on dysphagia 3 diet.  Will discontinue IV fluids with potassium today.  Put on oral potassium.  VTE Prophylaxis: Lovenox subcu  Code Status: Full code  Family Communication: Spoke with the patient in detail and emphasized the need for rehabilitation.  I also spoke with the patient's wife on the phone and updated her about the clinical condition of the patient.  She is strongly in favor of skilled nursing facility placement.  Disposition Plan: Skilled nursing facility placement as per physical therapy.  Spoke with the patient about it.  Consultants:  Neurology  Procedures:  EEG  Antibiotics:  Anti-infectives (From admission, onward)   None     Subjective:  Today, patient denies interval complaints.  Denies nausea, vomiting diarrhea.  Feels better.  Asking about going home.  Nursing staff reported that he had a fall this morning.  No injuries.  Noted except for a small skin tear.  Objective: Vitals:   02/26/19 2129 02/27/19 0649  BP: (!) 143/74 121/67  Pulse: (!) 57 (!) 50  Resp:    Temp:    SpO2:      Intake/Output Summary (Last 24 hours) at 02/27/2019 1042 Last data filed  at 02/27/2019 0700 Gross per 24 hour  Intake 2145.6 ml  Output 1100 ml  Net 1045.6 ml   Filed Weights   02/22/19 0623 02/25/19 0530 02/26/19 0413  Weight: 63.5 kg 56.6 kg 56.9 kg   Body mass index is 20.25 kg/m.   Physical Exam: GENERAL: Patient is alert awake and communicative. Not in obvious distress.  HENT: No scleral pallor or icterus. Pupils equally reactive to light. Oral mucosa is  mildly dry.  Scalp laceration. NECK: is supple, no palpable thyroid enlargement. CHEST: Clear to auscultation. Diminished breath sounds bilaterally. CVS: S1 and S2 heard, no murmur. Regular rate and rhythm. No pericardial rub. ABDOMEN: Soft, non-tender, bowel sounds are present. EXTREMITIES: No edema.  Left knee excoriation.  Scabs on the upper extremity. CNS: Moves all extremities.  No focal neurological deficits.  Slight right eyelid droop. SKIN: warm and dry without rashes.  Data Review: I have personally reviewed the following laboratory data and studies,  CBC: Recent Labs  Lab 02/22/19 0641 02/23/19 0832 02/24/19 1142 02/25/19 0427 02/26/19 0314 02/27/19 0816  WBC 9.1 6.8 6.7 7.4 6.0 6.3  NEUTROABS 6.6  --   --   --   --   --   HGB 11.9* 8.4* 9.0* 8.8* 8.4* 9.4*  HCT 34.7* 24.5* 25.1* 25.2* 24.1* 27.6*  MCV 100.3* 100.8* 98.0 99.2 98.4 100.0  PLT 144* 121* 130* 138* 153 [id number]   Basic Metabolic Panel: Recent Labs  Lab 02/22/19 1626 02/24/19 1142 02/24/19 2033 02/25/19 0427 02/25/19 0901 02/26/19 0314 02/27/19 0816  NA  --  137 137  --  137 134* 136  K  --  2.8* 2.9*  --  3.3* 3.7 3.7  CL  --  105 104  --  107 104 106  CO2  --  23 23  --  19* 20* 20*  GLUCOSE  --  99 100*  --  75 95 93  BUN  --  <5* 5*  --  6* 7* <5*  CREATININE  --  0.50* 0.55*  --  0.53* 0.48* 0.43*  CALCIUM  --  8.3* 8.0*  --  8.2* 8.2* 8.6*  MG 1.8  --   --  1.9  --  1.8 1.7  PHOS 4.3  --   --   --   --  2.8 3.2   Liver Function Tests: Recent Labs  Lab 02/22/19 0641 02/23/19 0832 02/24/19 2033 02/26/19 0314 02/27/19 0816  AST 47* 44* 45* 37 41  ALT 34 26 23 21 24   ALKPHOS 114 99 104 87 99  BILITOT 0.6 0.9 1.3* 1.1 1.1  PROT 6.0* 5.2* 5.4* 5.3* 6.0*  ALBUMIN 3.2* 2.9* 2.8* 2.7* 3.0*   No results for input(s): LIPASE, AMYLASE in the last 168 hours. No results for input(s): AMMONIA in the last 168 hours. Cardiac Enzymes: No results for input(s): CKTOTAL, CKMB, CKMBINDEX, TROPONINI  in the last 168 hours. BNP (last 3 results) No results for input(s): BNP in the last 8760 hours.  ProBNP (last 3 results) No results for input(s): PROBNP in the last 8760 hours.  CBG: Recent Labs  Lab 02/24/19 0925 02/24/19 1635 02/25/19 0807 02/26/19 0736 02/27/19 0746  GLUCAP 105* 95 71 92 92   Recent Results (from the past 240 hour(s))  SARS CORONAVIRUS 2 (TAT 6-24 HRS) Nasopharyngeal Nasopharyngeal Swab     Status: None   Collection Time: 02/22/19  4:26 PM   Specimen: Nasopharyngeal Swab  Result Value Ref Range Status   SARS Coronavirus  2 NEGATIVE NEGATIVE Final    Comment: (NOTE) SARS-CoV-2 target nucleic acids are NOT DETECTED. The SARS-CoV-2 RNA is generally detectable in upper and lower respiratory specimens during the acute phase of infection. Negative results do not preclude SARS-CoV-2 infection, do not rule out co-infections with other pathogens, and should not be used as the sole basis for treatment or other patient management decisions. Negative results must be combined with clinical observations, patient history, and epidemiological information. The expected result is Negative. Fact Sheet for Patients: SugarRoll.be Fact Sheet for Healthcare Providers: https://www.woods-mathews.com/ This test is not yet approved or cleared by the [place] FDA and  has been authorized for detection and/or diagnosis of SARS-CoV-2 by FDA under an Emergency Use Authorization (EUA). This EUA will remain  in effect (meaning this test can be used) for the duration of the COVID-19 declaration under Section 56 4(b)(1) of the Act, 21 U.S.C. section 360bbb-3(b)(1), unless the authorization is terminated or revoked sooner. Performed at [facility], [place] [address], [place], [place] [address]      Studies: DG Knee 1-2 Views Left  Result Date: 02/27/2019 CLINICAL DATA:  Left elbow pain after fall. EXAM: LEFT KNEE - 1-2 VIEW  COMPARISON:  None. FINDINGS: No evidence of fracture, dislocation, or joint effusion. No evidence of arthropathy or other focal bone abnormality. Vascular calcifications are noted. IMPRESSION: No significant abnormality seen in the left knee. Electronically Signed   By: Marijo Conception M.D.   On: 02/27/2019 09:47   EEG adult  Result Date: 02/25/2019 Lora Havens, MD     02/25/2019 12:28 PM Patient Name: KOLSTEN BESSLER MRN: [id number] Epilepsy Attending: Lora Havens Referring Physician/Provider: Dr. Amie [place] Date: 02/25/2019 Duration: 23.45 minutes Patient history: 64 year old male with history of alcohol use and alcohol-related seizures asymmetry of Keppra and altered mental status.  EEG to evaluate for seizures. Level of alertness: awake, asleep AEDs during EEG study: None Technical aspects: This EEG study was done with scalp electrodes positioned according to the 10-20 International system of electrode placement. Electrical activity was acquired at a sampling rate of 500Hz  and reviewed with a high frequency filter of 70Hz  and a low frequency filter of 1Hz . EEG data were recorded continuously and digitally stored. DESCRIPTION:  The posterior dominant rhythm consists of 8-9 Hz activity of moderate voltage (25-35 uV) seen predominantly in posterior head regions, symmetric and reactive to eye opening and eye closing.    Sleep was characterized vertex waves. Hyperventilation and photic stimulation were not performed. IMPRESSION: This study is within normal limits.  No seizures or epileptiform discharges were seen throughout the recording. Priyanka Barbra Sarks    Scheduled Meds: . enoxaparin (LOVENOX) injection  40 mg Subcutaneous Q24H  . folic acid  1 mg Intravenous Daily  . mouth rinse  15 mL Mouth Rinse BID  . multivitamin with minerals  1 tablet Oral Daily  . thiamine injection  100 mg Intravenous Daily    Continuous Infusions: . 0.9 % NaCl with KCl 40 mEq / L 50 mL/hr (02/27/19 0117)      Flora Lipps, MD  [facility] 02/27/2019

## 2019-02-27 NOTE — Progress Notes (Signed)
Phone call received from tele sitter stating that the patient had fallen.  Nursing staff was assisting patient back to bed when this RN arrived to room.  VSS.  No injuries noted.  Patient did c/o left knee pain, and a skin tear was noted; foam dressing applied to left knee skin tear.  Jeannette Corpus, NP was notified and a left knee xray was ordered.  Patient back in bed with bed alarm on and call light in reach.  Jodell Cipro

## 2019-02-28 LAB — COMPREHENSIVE METABOLIC PANEL
ALT: 24 U/L (ref 0–44)
AST: 32 U/L (ref 15–41)
Albumin: 2.9 g/dL — ABNORMAL LOW (ref 3.5–5.0)
Alkaline Phosphatase: 97 U/L (ref 38–126)
Anion gap: 11 (ref 5–15)
BUN: <5 mg/dL — ABNORMAL LOW (ref 8–23)
CO2: 24 mmol/L (ref 22–32)
Calcium: 8.9 mg/dL (ref 8.9–10.3)
Chloride: 101 mmol/L (ref 98–111)
Creatinine, Ser: 0.55 mg/dL — ABNORMAL LOW (ref 0.61–1.24)
GFR calc Af Amer: >60 mL/min (ref >60)
GFR calc non Af Amer: >60 mL/min (ref >60)
Glucose, Bld: 117 mg/dL — ABNORMAL HIGH (ref 70–99)
Potassium: 3.3 mmol/L — ABNORMAL LOW (ref 3.5–5.1)
Sodium: 136 mmol/L (ref 135–145)
Total Bilirubin: 0.8 mg/dL (ref 0.3–1.2)
Total Protein: 6 g/dL — ABNORMAL LOW (ref 6.5–8.1)

## 2019-02-28 LAB — CBC
HCT: 28 % — ABNORMAL LOW (ref 39.0–52.0)
Hemoglobin: 9.6 g/dL — ABNORMAL LOW (ref 13.0–17.0)
MCH: 34.2 pg — ABNORMAL HIGH (ref 26.0–34.0)
MCHC: 34.3 g/dL (ref 30.0–36.0)
MCV: 99.6 fL (ref 80.0–100.0)
Platelets: 275 10*3/uL (ref 150–400)
RBC: 2.81 MIL/uL — ABNORMAL LOW (ref 4.22–5.81)
RDW: 12.1 % (ref 11.5–15.5)
WBC: 6.3 10*3/uL (ref 4.0–10.5)
nRBC: 0 % (ref 0.0–0.2)

## 2019-02-28 LAB — GLUCOSE, CAPILLARY
Glucose-Capillary: 110 mg/dL — ABNORMAL HIGH (ref 70–99)
Glucose-Capillary: 137 mg/dL — ABNORMAL HIGH (ref 70–99)

## 2019-02-28 LAB — MAGNESIUM: Magnesium: 1.8 mg/dL (ref 1.7–2.4)

## 2019-02-28 MED ORDER — MAGNESIUM OXIDE 400 (241.3 MG) MG PO TABS
400.0000 mg | ORAL_TABLET | Freq: Two times a day (BID) | ORAL | Status: DC
  Administered 2019-02-28 – 2019-03-03 (×7): 400 mg via ORAL
  Filled 2019-02-28 (×7): qty 1

## 2019-02-28 MED ORDER — POTASSIUM CHLORIDE CRYS ER 20 MEQ PO TBCR
40.0000 meq | EXTENDED_RELEASE_TABLET | Freq: Two times a day (BID) | ORAL | Status: DC
  Administered 2019-02-28 – 2019-03-02 (×5): 40 meq via ORAL
  Filled 2019-02-28 (×5): qty 2

## 2019-02-28 MED ORDER — FOLIC ACID 1 MG PO TABS
1.0000 mg | ORAL_TABLET | Freq: Every day | ORAL | Status: DC
  Administered 2019-02-28 – 2019-03-03 (×4): 1 mg via ORAL
  Filled 2019-02-28 (×4): qty 1

## 2019-02-28 NOTE — Plan of Care (Signed)

## 2019-02-28 NOTE — Care Management Important Message (Signed)
Important Message  Patient Details  Name: Edwin Torres MRN: [id number] Date of Birth: 12/09/1955   Medicare Important Message Given:  Yes     [name] 02/28/2019, 2:34 PM

## 2019-02-28 NOTE — TOC Progression Note (Signed)
Transition of Care [facility]) - Progression Note    Patient Details  Name: Edwin Torres MRN: [id number] Date of Birth: 10-25-1955  Transition of Care [facility]) CM/SW [place],  Phone Number: [phone] 02/28/2019, 9:41 AM  Clinical Narrative:     CSW spoke with patient's wife Edwin Torres as she reports patient seems reluctant to go to SNF however she would like to be the decision maker. CSW informed Edwin Torres that patient is documented as not being fully oriented, therefore decision making would go to her the spouse. CSW did inform her of HCPOA paperwork she can legally complete with patient, she reports she will follow up on this to complete.   CSW informed Edwin Torres of no current bed offers at this time, potentially due to holiday weekend. CSW will follow up with facilities today to identify any bed offers and inform Edwin Torres once bed offers are made.   Expected Discharge Plan: [place] Barriers to Discharge: Continued Medical Work up  Expected Discharge Plan and Services Expected Discharge Plan: [place] In-house Referral: NA   Post Acute Care Choice: [place] Living arrangements for the past 2 months: Single Family Home                                       Social Determinants of Health (SDOH) Interventions    Readmission Risk Interventions No flowsheet data found.

## 2019-02-28 NOTE — Progress Notes (Addendum)
Physical Therapy Treatment Patient Details Name: Edwin Torres MRN: [id number] DOB: Jul 25, 1955 Today's Date: 02/28/2019    History of Present Illness 64yo male s/p fall at home, brought to ED by police due to refusal to seek medical care at home. CT head and cervical spine clear of acute fracture. Ethanol level 177 in ED. PMH alcohol and tobacco abuse, seizures    PT Comments    Patient received in bed, much more alert and aware today and very cooperative with PT, but did state "I can't wait to get home and get some Olena Heckle instead of laying in bed like a vegetable". Able to complete bed mobility with min guard, then functional transfers and gait approximately 56f with RW and MinAx2 for safety and balance. Needed Max cues for safe use of device due to tendency to push it too far ahead of him. He was left in bed with all needs met, bed alarm active. Continue to recommend SNF and 24/7A moving forward.     Follow Up Recommendations  SNF;Supervision/Assistance - 24 hour     Equipment Recommendations  Other (comment)(defer to next setting)    Recommendations for Other Services       Precautions / Restrictions Precautions Precautions: Fall;Other (comment) Precaution Comments: very unsteading; multidirectional balance loss Restrictions Weight Bearing Restrictions: No    Mobility  Bed Mobility Overal bed mobility: Needs Assistance Bed Mobility: Supine to Sit;Sit to Supine     Supine to sit: Min guard Sit to supine: Min guard   General bed mobility comments: much improved today- min guard for safety with lines  Transfers Overall transfer level: Needs assistance Equipment used: Rolling walker (2 wheeled) Transfers: Sit to/from Stand Sit to Stand: Min assist;+2 physical assistance         General transfer comment: MinA+2 for safety due to gross unsteadiness, poor awareness of safety and cues for hand placement and sequencing provided  Ambulation/Gait Ambulation/Gait  assistance: Min assist;+2 physical assistance Gait Distance (Feet): 20 Feet Assistive device: Rolling walker (2 wheeled) Gait Pattern/deviations: Step-through pattern;Decreased step length - right;Decreased step length - left;Decreased dorsiflexion - right;Decreased dorsiflexion - left;Drifts right/left;Narrow base of support Gait velocity: decreased   General Gait Details: MinAx2 for safety and balance with RW, Max cues for safe use of device and to maintain upright; did require ModA when turning due to poor balance reactions   Stairs             Wheelchair Mobility    Modified Rankin (Stroke Patients Only)       Balance Overall balance assessment: Needs assistance Sitting-balance support: Bilateral upper extremity supported;Feet supported Sitting balance-Leahy Scale: Fair Sitting balance - Comments: close min guard for safety, better able to maintain upright   Standing balance support: Bilateral upper extremity supported;During functional activity Standing balance-Leahy Scale: Poor Standing balance comment: reliant on external support and B UE support                            Cognition Arousal/Alertness: Awake/alert Behavior During Therapy: Flat affect Overall Cognitive Status: No family/caregiver present to determine baseline cognitive functioning Area of Impairment: Orientation;Attention;Memory;Following commands;Safety/judgement;Awareness;Problem solving                 Orientation Level: Disoriented to;Place;Time;Situation Current Attention Level: Sustained Memory: Decreased recall of precautions;Decreased short-term memory Following Commands: Follows one step commands consistently;Follows one step commands with increased time Safety/Judgement: Decreased awareness of safety;Decreased awareness of deficits Awareness: Intellectual Problem  Solving: Slow processing;Decreased initiation;Difficulty sequencing;Requires verbal cues;Requires tactile  cues General Comments: more alert and aware today, remains disoriented but was calm and cooperative, able to follow cues with extended time      Exercises      General Comments        Pertinent Vitals/Pain Pain Assessment: No/denies pain Pain Score: 0-No pain Pain Intervention(s): Limited activity within patient's tolerance;Monitored during session    Home Living                      Prior Function            PT Goals (current goals can now be found in the care plan section) Acute Rehab PT Goals Patient Stated Goal: unable to state PT Goal Formulation: Patient unable to participate in goal setting Time For Goal Achievement: 03/09/19 Potential to Achieve Goals: Fair Progress towards PT goals: Progressing toward goals    Frequency    Min 2X/week      PT Plan Current plan remains appropriate    Co-evaluation              AM-PAC PT "6 Clicks" Mobility   Outcome Measure  Help needed turning from your back to your side while in a flat bed without using bedrails?: A Little Help needed moving from lying on your back to sitting on the side of a flat bed without using bedrails?: A Little Help needed moving to and from a bed to a chair (including a wheelchair)?: A Lot Help needed standing up from a chair using your arms (e.g., wheelchair or bedside chair)?: A Lot Help needed to walk in hospital room?: A Lot Help needed climbing 3-5 steps with a railing? : Total 6 Click Score: 13    End of Session Equipment Utilized During Treatment: Gait belt Activity Tolerance: Patient tolerated treatment well Patient left: in bed;with call bell/phone within reach;with bed alarm set   PT Visit Diagnosis: Unsteadiness on feet (R26.81);Difficulty in walking, not elsewhere classified (R26.2);Muscle weakness (generalized) (M62.81);History of falling (Z91.81);Other abnormalities of gait and mobility (R26.89)     Time: 4239-5320 PT Time Calculation (min) (ACUTE ONLY): 12  min  Charges:  $Gait Training: 8-22 mins                     Windell Norfolk, DPT, PN1   Supplemental Physical Therapist [place]    Pager [phone] Acute Rehab Office [phone]

## 2019-02-28 NOTE — Progress Notes (Signed)
PROGRESS NOTE  CAREEM GREW [id number] DOB: December 16, 1955 DOA: 02/22/2019 PCP: Patient, No Pcp Per   LOS: 6 days   Brief narrative: As per HPI, Patient is a 64 year old male with past medical history of alcohol and tobacco abuse was brought in on late afternoon of 12/29 by EMS in [place] [employer] for a recent fall that occurred 3 hours prior that led to a large laceration on the left side of his head.  Patient did initially refuse to come in so his wife had called the emergency services and he was brought in.  His last drink of beer had been when the fall occurred.   During hospitalization the nursing staff reported that there was asymmetry in the pupil so code stroke was called in.  Head CT was unremarkable.  Neurology was consulted.    Assessment/Plan:  Principal Problem:   Frequent falls Active Problems:   Alcohol abuse with intoxication (HCC)   Increased anion gap metabolic acidosis   Macrocytic anemia   Tobacco abuse   Physical deconditioning   Alcohol intoxication (HCC)   Thrombocytopenia (HCC)   Scalp laceration   Hypokalemia   Somnolence, daytime  Alcohol abuse with intoxication and withdrawal.  Improving.  On CIWA protocol. Denies any hallucinations but has some tremors. On Librium. Ativan historically (as per the wife) caused agitation and somnolence. Continue thiamine and folic acid. MRI of the brain was performed which did not show any evidence of stroke. CT angiogram of the neck/head did not show any evidence of large vessel occlusion. EEG showed no evidence of seizures. Patient was seen by speech therapy who recommended dysphagia III diet.  Frequent falls- fall 02/27/2019.   fall precautions.  Seen by physical therapy recommended skilled nursing facility placement.  X-ray of the left knee did not show any acute abnormality.  Mild hypokalemia.  Will continue to replenish orally.  Last magnesium of 1.8.  Left parietal scalp hematoma/laceration. Staples in  place.  Increased anion gap metabolic acidosis: on presentation, resolved.   Macrocytic anemia: Stable., Hemoglobin of 9.6 today.. Will closely monitor.  No external bleeding   Tobacco abuse: Continue nicotine patch  Physical deconditioning, debility and falls: PT OT on board and recommended skilled nursing facility on discharge.   Thrombocytopenia .  Resolved.  Hypokalemia: improved with IV potassium.  Potassium 3.7 today.  Put on daily oral potassium.  Nutrition.  Seen by speech therapy on dysphagia 3 diet.   VTE Prophylaxis: Lovenox subcu  Code Status: Full code  Family Communication: none today.  Disposition Plan: Skilled nursing facility placement   Consultants:  Neurology  Procedures:  EEG  Antibiotics:  Anti-infectives (From admission, onward)   None     Subjective:  Today, patient denies interval complaints.  No hallucinations delirium.  Denies overt pain.  No nausea vomiting abdominal pain or shortness of breath.  Objective: Vitals:   02/28/19 0347 02/28/19 0554  BP: 127/72 130/79  Pulse: 87 74  Resp:  16  Temp:  98.1 F (36.7 C)  SpO2:  99%    Intake/Output Summary (Last 24 hours) at 02/28/2019 0949 Last data filed at 02/28/2019 0556 Gross per 24 hour  Intake 600 ml  Output 2300 ml  Net -1700 ml   Filed Weights   02/25/19 0530 02/26/19 0413 02/28/19 0554  Weight: 56.6 kg 56.9 kg 56.4 kg   Body mass index is 20.07 kg/m.   Physical Exam: GENERAL: Patient is alert awake and communicative. Not in obvious distress.  Fully thinly built.  HENT: No scleral pallor or icterus. Pupils equally reactive to light. Oral mucosa is mildly dry.  Scalp laceration. NECK: is supple, no palpable thyroid enlargement. CHEST: Clear to auscultation. Diminished breath sounds bilaterally. CVS: S1 and S2 heard, no murmur. Regular rate and rhythm. No pericardial rub. ABDOMEN: Soft, non-tender, bowel sounds are present. EXTREMITIES: No edema.  Left knee  excoriation.  Scabs on the upper extremity. CNS: Moves all extremities.  No focal neurological deficits.  Slight right eyelid droop.  Tremor SKIN: warm and dry without rashes.  Data Review: I have personally reviewed the following laboratory data and studies,  CBC: Recent Labs  Lab 02/22/19 0641 02/22/19 1533 02/24/19 1142 02/25/19 0427 02/26/19 0314 02/27/19 0816 02/28/19 0638  WBC 9.1   < > 6.7 7.4 6.0 6.3 6.3  NEUTROABS 6.6  --   --   --   --   --   --   HGB 11.9*   < > 9.0* 8.8* 8.4* 9.4* 9.6*  HCT 34.7*   < > 25.1* 25.2* 24.1* 27.6* 28.0*  MCV 100.3*   < > 98.0 99.2 98.4 100.0 99.6  PLT 144*   < > 130* 138* 153 219 275   < > = values in this interval not displayed.   Basic Metabolic Panel: Recent Labs  Lab 02/22/19 1626 02/24/19 2033 02/25/19 0427 02/25/19 0901 02/26/19 0314 02/27/19 0816 02/28/19 0638  NA  --  137  --  137 134* 136 136  K  --  2.9*  --  3.3* 3.7 3.7 3.3*  CL  --  104  --  107 104 106 101  CO2  --  23  --  19* 20* 20* 24  GLUCOSE  --  100*  --  75 95 93 117*  BUN  --  5*  --  6* 7* <5* <5*  CREATININE  --  0.55*  --  0.53* 0.48* 0.43* 0.55*  CALCIUM  --  8.0*  --  8.2* 8.2* 8.6* 8.9  MG 1.8  --  1.9  --  1.8 1.7 1.8  PHOS 4.3  --   --   --  2.8 3.2  --    Liver Function Tests: Recent Labs  Lab 02/23/19 0832 02/24/19 2033 02/26/19 0314 02/27/19 0816 02/28/19 0638  AST 44* 45* 37 41 32  ALT 26 23 21 24 24   ALKPHOS 99 104 87 99 97  BILITOT 0.9 1.3* 1.1 1.1 0.8  PROT 5.2* 5.4* 5.3* 6.0* 6.0*  ALBUMIN 2.9* 2.8* 2.7* 3.0* 2.9*   No results for input(s): LIPASE, AMYLASE in the last 168 hours. No results for input(s): AMMONIA in the last 168 hours. Cardiac Enzymes: No results for input(s): CKTOTAL, CKMB, CKMBINDEX, TROPONINI in the last 168 hours. BNP (last 3 results) No results for input(s): BNP in the last 8760 hours.  ProBNP (last 3 results) No results for input(s): PROBNP in the last 8760 hours.  CBG: Recent Labs  Lab  02/24/19 1635 02/25/19 0807 02/26/19 0736 02/27/19 0746 02/28/19 0723  GLUCAP 95 71 92 92 137*   Recent Results (from the past 240 hour(s))  SARS CORONAVIRUS 2 (TAT 6-24 HRS) Nasopharyngeal Nasopharyngeal Swab     Status: None   Collection Time: 02/22/19  4:26 PM   Specimen: Nasopharyngeal Swab  Result Value Ref Range Status   SARS Coronavirus 2 NEGATIVE NEGATIVE Final    Comment: (NOTE) SARS-CoV-2 target nucleic acids are NOT DETECTED. The SARS-CoV-2 RNA is generally detectable in upper and lower respiratory  specimens during the acute phase of infection. Negative results do not preclude SARS-CoV-2 infection, do not rule out co-infections with other pathogens, and should not be used as the sole basis for treatment or other patient management decisions. Negative results must be combined with clinical observations, patient history, and epidemiological information. The expected result is Negative. Fact Sheet for Patients: SugarRoll.be Fact Sheet for Healthcare Providers: https://www.woods-mathews.com/ This test is not yet approved or cleared by the [place] FDA and  has been authorized for detection and/or diagnosis of SARS-CoV-2 by FDA under an Emergency Use Authorization (EUA). This EUA will remain  in effect (meaning this test can be used) for the duration of the COVID-19 declaration under Section 56 4(b)(1) of the Act, 21 U.S.C. section 360bbb-3(b)(1), unless the authorization is terminated or revoked sooner. Performed at [facility], [place] [address], [place], [place] [address]      Studies: DG Knee 1-2 Views Left  Result Date: 02/27/2019 CLINICAL DATA:  Left elbow pain after fall. EXAM: LEFT KNEE - 1-2 VIEW COMPARISON:  None. FINDINGS: No evidence of fracture, dislocation, or joint effusion. No evidence of arthropathy or other focal bone abnormality. Vascular calcifications are noted. IMPRESSION: No significant abnormality  seen in the left knee. Electronically Signed   By: Marijo Conception M.D.   On: 02/27/2019 09:47    Scheduled Meds: . enoxaparin (LOVENOX) injection  40 mg Subcutaneous Q24H  . folic acid  1 mg Intravenous Daily  . mouth rinse  15 mL Mouth Rinse BID  . multivitamin with minerals  1 tablet Oral Daily  . nicotine  21 mg Transdermal Daily  . potassium chloride  40 mEq Oral Daily  . thiamine  100 mg Oral Daily    Continuous Infusions:    Flora Lipps, MD  [facility] 02/28/2019

## 2019-03-01 LAB — CBC
HCT: 29.1 % — ABNORMAL LOW (ref 39.0–52.0)
Hemoglobin: 9.8 g/dL — ABNORMAL LOW (ref 13.0–17.0)
MCH: 33.8 pg (ref 26.0–34.0)
MCHC: 33.7 g/dL (ref 30.0–36.0)
MCV: 100.3 fL — ABNORMAL HIGH (ref 80.0–100.0)
Platelets: 295 10*3/uL (ref 150–400)
RBC: 2.9 MIL/uL — ABNORMAL LOW (ref 4.22–5.81)
RDW: 12.6 % (ref 11.5–15.5)
WBC: 6.5 10*3/uL (ref 4.0–10.5)
nRBC: 0 % (ref 0.0–0.2)

## 2019-03-01 LAB — BASIC METABOLIC PANEL
Anion gap: 10 (ref 5–15)
BUN: 6 mg/dL — ABNORMAL LOW (ref 8–23)
CO2: 22 mmol/L (ref 22–32)
Calcium: 9.2 mg/dL (ref 8.9–10.3)
Chloride: 107 mmol/L (ref 98–111)
Creatinine, Ser: 0.55 mg/dL — ABNORMAL LOW (ref 0.61–1.24)
GFR calc Af Amer: >60 mL/min (ref >60)
GFR calc non Af Amer: >60 mL/min (ref >60)
Glucose, Bld: 106 mg/dL — ABNORMAL HIGH (ref 70–99)
Potassium: 4.3 mmol/L (ref 3.5–5.1)
Sodium: 139 mmol/L (ref 135–145)

## 2019-03-01 LAB — MAGNESIUM: Magnesium: 2 mg/dL (ref 1.7–2.4)

## 2019-03-01 LAB — GLUCOSE, CAPILLARY: Glucose-Capillary: 95 mg/dL (ref 70–99)

## 2019-03-01 NOTE — Plan of Care (Signed)

## 2019-03-01 NOTE — Progress Notes (Addendum)
PROGRESS NOTE  Edwin Torres [id number] DOB: 02-Feb-1956 DOA: 02/22/2019 PCP: Patient, No Pcp Per   LOS: 7 days   Brief narrative: As per HPI, Patient is a 64 year old male with past medical history of alcohol and tobacco abuse was brought in on late afternoon of 12/29 by EMS in [place] [employer] for a recent fall that occurred 3 hours prior that led to a large laceration on the left side of his head.  Patient did initially refuse to come in so his wife had called the emergency services and he was brought in.  His last drink of beer had been when the fall occurred.   During hospitalization the nursing staff reported that there was asymmetry in the pupil so code stroke was called in.  Head CT was unremarkable.  Neurology was consulted.    Assessment/Plan:  Principal Problem:   Frequent falls Active Problems:   Alcohol abuse with intoxication (HCC)   Increased anion gap metabolic acidosis   Macrocytic anemia   Tobacco abuse   Physical deconditioning   Alcohol intoxication (HCC)   Thrombocytopenia (HCC)   Scalp laceration   Hypokalemia   Somnolence, daytime  Alcohol abuse with intoxication and withdrawal.  Improving.  On CIWA protocol. Mild tremors only without hallucinations On Librium. Ativan historically (as per the wife) caused agitation and somnolence. Continue thiamine and folic acid. MRI of the brain was performed which did not show any evidence of stroke. CT angiogram of the neck/head did not show any evidence of large vessel occlusion. EEG showed no evidence of seizures. Patient was seen by speech therapy who recommended dysphagia III diet.  Frequent falls-  Recent fall 02/27/2019. Patient is deconditioned.  Will continue fall precautions.  Seen by physical therapy who recommended skilled nursing facility placement.  X-ray of the left knee did not show any acute abnormality.  Mild hypokalemia.  Improved with PO KCL.  Last magnesium of 1.8.  Left parietal scalp  hematoma/laceration. Staples in place.  Increased anion gap metabolic acidosis: on presentation, resolved.   Macrocytic anemia: Stable., Hemoglobin of 9.8 today.. Will closely monitor.  No external bleeding   Tobacco abuse: Continue nicotine patch  Physical deconditioning, debility and falls: PT OT on board and recommended skilled nursing facility on discharge. I have spoken with the patient and the wife about it.   Thrombocytopenia .  Resolved.  Nutrition.  Seen by speech therapy on dysphagia 3 diet.   VTE Prophylaxis: Lovenox subcu  Code Status: Full code   Family Communication: none today.  Disposition Plan: Skilled nursing facility placement, pending.  Patient has been seen by physical therapy who recommended skilled nursing facility on discharge.  I have spoken with the the patient as well as patient's wife at multiple occasions regarding disposition plan.  Patient's wife strongly feels that patient would benefit from a skilled nursing facility placement.  Addendum:  03/01/2019 5:30 PM  I have been requested to place in competency evaluation for this patient per request of the family and staff. Consult psych  Consultants:  Neurology  Procedures:  EEG  Antibiotics:  Anti-infectives (From admission, onward)   None     Subjective:  Today, patient denies any nausea, vomiting, delirium or hallucinations.  No abdominal pain, chest pain or shortness of breath.  Patient states that he wants to go home but is open to talking about skilled nursing facility.  Objective: Vitals:   02/28/19 2031 03/01/19 0557  BP: 110/69 97/65  Pulse: 93 79  Resp:  Temp: 98.3 F (36.8 C) 98.1 F (36.7 C)  SpO2: 100% 100%    Intake/Output Summary (Last 24 hours) at 03/01/2019 1131 Last data filed at 02/28/2019 2030 Gross per 24 hour  Intake 360 ml  Output 1350 ml  Net -990 ml   Filed Weights   02/26/19 0413 02/28/19 0554 03/01/19 0557  Weight: 56.9 kg 56.4 kg 53.6 kg   Body  mass index is 19.07 kg/m.   Physical Exam: GENERAL: Patient is alert awake and communicative. Not in obvious distress.   thinly built, deconditioned HENT: No scleral pallor or icterus. Pupils equally reactive to light. Oral mucosa is mildly dry.  Scalp laceration. NECK: is supple, no palpable thyroid enlargement. CHEST: Clear to auscultation. Diminished breath sounds bilaterally. CVS: S1 and S2 heard, no murmur. Regular rate and rhythm. No pericardial rub. ABDOMEN: Soft, non-tender, bowel sounds are present. EXTREMITIES: No edema.  Left knee excoriation.  Scabs on the upper extremity. CNS: Moves all extremities.  No focal neurological deficits.  Slight right eyelid droop.  Tremor SKIN: warm and dry without rashes.  Data Review: I have personally reviewed the following laboratory data and studies,  CBC: Recent Labs  Lab 02/25/19 0427 02/26/19 0314 02/27/19 0816 02/28/19 0638 03/01/19 0319  WBC 7.4 6.0 6.3 6.3 6.5  HGB 8.8* 8.4* 9.4* 9.6* 9.8*  HCT 25.2* 24.1* 27.6* 28.0* 29.1*  MCV 99.2 98.4 100.0 99.6 100.3*  PLT 138* 153 219 275 [id number]   Basic Metabolic Panel: Recent Labs  Lab 02/22/19 1626 02/25/19 0427 02/25/19 0901 02/26/19 0314 02/27/19 0816 02/28/19 0638 03/01/19 0319  NA  --   --  137 134* 136 136 139  K  --   --  3.3* 3.7 3.7 3.3* 4.3  CL  --   --  107 104 106 101 107  CO2  --   --  19* 20* 20* 24 22  GLUCOSE  --   --  75 95 93 117* 106*  BUN  --   --  6* 7* <5* <5* 6*  CREATININE  --   --  0.53* 0.48* 0.43* 0.55* 0.55*  CALCIUM  --   --  8.2* 8.2* 8.6* 8.9 9.2  MG 1.8 1.9  --  1.8 1.7 1.8 2.0  PHOS 4.3  --   --  2.8 3.2  --   --    Liver Function Tests: Recent Labs  Lab 02/23/19 0832 02/24/19 2033 02/26/19 0314 02/27/19 0816 02/28/19 0638  AST 44* 45* 37 41 32  ALT 26 23 21 24 24   ALKPHOS 99 104 87 99 97  BILITOT 0.9 1.3* 1.1 1.1 0.8  PROT 5.2* 5.4* 5.3* 6.0* 6.0*  ALBUMIN 2.9* 2.8* 2.7* 3.0* 2.9*   No results for input(s): LIPASE, AMYLASE in  the last 168 hours. No results for input(s): AMMONIA in the last 168 hours. Cardiac Enzymes: No results for input(s): CKTOTAL, CKMB, CKMBINDEX, TROPONINI in the last 168 hours. BNP (last 3 results) No results for input(s): BNP in the last 8760 hours.  ProBNP (last 3 results) No results for input(s): PROBNP in the last 8760 hours.  CBG: Recent Labs  Lab 02/25/19 0807 02/26/19 0736 02/27/19 0746 02/28/19 0723 03/01/19 0750  GLUCAP 71 92 92 137* 95   Recent Results (from the past 240 hour(s))  SARS CORONAVIRUS 2 (TAT 6-24 HRS) Nasopharyngeal Nasopharyngeal Swab     Status: None   Collection Time: 02/22/19  4:26 PM   Specimen: Nasopharyngeal Swab  Result Value Ref Range Status  SARS Coronavirus 2 NEGATIVE NEGATIVE Final    Comment: (NOTE) SARS-CoV-2 target nucleic acids are NOT DETECTED. The SARS-CoV-2 RNA is generally detectable in upper and lower respiratory specimens during the acute phase of infection. Negative results do not preclude SARS-CoV-2 infection, do not rule out co-infections with other pathogens, and should not be used as the sole basis for treatment or other patient management decisions. Negative results must be combined with clinical observations, patient history, and epidemiological information. The expected result is Negative. Fact Sheet for Patients: SugarRoll.be Fact Sheet for Healthcare Providers: https://www.woods-mathews.com/ This test is not yet approved or cleared by the [place] FDA and  has been authorized for detection and/or diagnosis of SARS-CoV-2 by FDA under an Emergency Use Authorization (EUA). This EUA will remain  in effect (meaning this test can be used) for the duration of the COVID-19 declaration under Section 56 4(b)(1) of the Act, 21 U.S.C. section 360bbb-3(b)(1), unless the authorization is terminated or revoked sooner. Performed at [facility], [place] [address], [place],  [place] [address]      Studies: No results found.  Scheduled Meds: . enoxaparin (LOVENOX) injection  40 mg Subcutaneous Q24H  . folic acid  1 mg Oral Daily  . magnesium oxide  400 mg Oral BID  . mouth rinse  15 mL Mouth Rinse BID  . multivitamin with minerals  1 tablet Oral Daily  . nicotine  21 mg Transdermal Daily  . potassium chloride  40 mEq Oral BID  . thiamine  100 mg Oral Daily    Continuous Infusions:   Flora Lipps, MD  [facility] 03/01/2019

## 2019-03-02 LAB — CBC
HCT: 29.8 % — ABNORMAL LOW (ref 39.0–52.0)
Hemoglobin: 9.8 g/dL — ABNORMAL LOW (ref 13.0–17.0)
MCH: 33.4 pg (ref 26.0–34.0)
MCHC: 32.9 g/dL (ref 30.0–36.0)
MCV: 101.7 fL — ABNORMAL HIGH (ref 80.0–100.0)
Platelets: 343 10*3/uL (ref 150–400)
RBC: 2.93 MIL/uL — ABNORMAL LOW (ref 4.22–5.81)
RDW: 12.8 % (ref 11.5–15.5)
WBC: 6.7 10*3/uL (ref 4.0–10.5)
nRBC: 0 % (ref 0.0–0.2)

## 2019-03-02 LAB — BASIC METABOLIC PANEL
Anion gap: 10 (ref 5–15)
BUN: 10 mg/dL (ref 8–23)
CO2: 23 mmol/L (ref 22–32)
Calcium: 9.2 mg/dL (ref 8.9–10.3)
Chloride: 107 mmol/L (ref 98–111)
Creatinine, Ser: 0.68 mg/dL (ref 0.61–1.24)
GFR calc Af Amer: >60 mL/min (ref >60)
GFR calc non Af Amer: >60 mL/min (ref >60)
Glucose, Bld: 95 mg/dL (ref 70–99)
Potassium: 4.3 mmol/L (ref 3.5–5.1)
Sodium: 140 mmol/L (ref 135–145)

## 2019-03-02 LAB — MAGNESIUM: Magnesium: 2.2 mg/dL (ref 1.7–2.4)

## 2019-03-02 LAB — GLUCOSE, CAPILLARY: Glucose-Capillary: 83 mg/dL (ref 70–99)

## 2019-03-02 MED ORDER — POTASSIUM CHLORIDE CRYS ER 20 MEQ PO TBCR
40.0000 meq | EXTENDED_RELEASE_TABLET | Freq: Every day | ORAL | Status: DC
  Administered 2019-03-03: 40 meq via ORAL
  Filled 2019-03-02: qty 2

## 2019-03-02 MED ORDER — ENSURE ENLIVE PO LIQD
237.0000 mL | Freq: Two times a day (BID) | ORAL | Status: DC
  Administered 2019-03-02 – 2019-03-03 (×3): 237 mL via ORAL

## 2019-03-02 NOTE — Progress Notes (Addendum)
  Speech Language Pathology Treatment: Dysphagia  Patient Details Name: Edwin Torres MRN: [id number] DOB: 04-07-1955 Today's Date: 03/02/2019 Time: [id number] SLP Time Calculation (min) (ACUTE ONLY): 17 min  Assessment / Plan / Recommendation Clinical Impression  Patient seen at bedside for dysphagia tx. Patient sleeping upon SLP arrival, but waking up and sitting upright as dinner meal tray was brought. Patient somewhat confused, asking for various people and referring to this clinician as "the [place] girl." This clinician is not [place].  Patient seen with PO trials of mashed potatoes, chopped chicken, chopped green beans and a soft dinner roll. Pt able to self feed, but noted to over-fill oral cavity. Pt with prolonged bolus formation c/b some discoordination. Overall, patient demonstrating impulsivity with some reduced safety awareness when seen during today's meal. Pt responsive to verbal cues to slow rate of intake and reduce amount of food in mouth. No overt s/s aspiration seen with dysphagia 3 solids or cup sips of thin liquids. Pt seen with regular trial (graham cracker): good oral acceptance, but difficulty forming a cohesive bolus with diffuse residue following the swallow. Pt able to clear with liquid rinse. Pt with delayed cough following the graham cracker (followed by thin liquid rinse). Pt stating he wasn't sure if cough was related to PO. Recommend that patient continue dysphagia 3/thin liquids diet, to be upgraded clinically. Patient recommended to slow rate of intake, take small bites/sips, alternate liquids and solids and eat while sitting upright.  D/W RN, RN reports patient had some difficulty swallowing pills (whole with liquids). SLP rec patient receive pills crushed in puree.   HPI HPI: Edwin Torres is a 64 y.o. male with medical history significant of alcohol abuse, tobacco abuse brought by EMS and [facility] Department to emergency department for evaluation of recent  fall      SLP Plan          Recommendations  Diet recommendations: Dysphagia 3 (mechanical soft);Thin liquid Liquids provided via: Cup Medication Administration: Crushed with puree Supervision: Intermittent supervision to cue for compensatory strategies;Patient able to self feed Compensations: Minimize environmental distractions;Slow rate;Small sips/bites;Lingual sweep for clearance of pocketing;Follow solids with liquid Postural Changes and/or Swallow Maneuvers: Seated upright 90 degrees                Oral Care Recommendations: Oral care BID SLP Visit Diagnosis: Dysphagia, unspecified (R13.10)       [place], M.Ed., CCC-SLP Speech Therapy Acute Rehab 03/02/2019, 6:07 PM

## 2019-03-02 NOTE — Plan of Care (Signed)

## 2019-03-02 NOTE — Plan of Care (Signed)
  Problem: Education: Goal: Knowledge of General Education information will improve Description: Including pain rating scale, medication(s)/side effects and non-pharmacologic comfort measures Outcome: Progressing   Problem: Clinical Measurements: Goal: Will remain free from infection Outcome: Progressing Goal: Diagnostic test results will improve Outcome: Progressing   Problem: Activity: Goal: Risk for activity intolerance will decrease Outcome: Progressing   Problem: Nutrition: Goal: Adequate nutrition will be maintained Outcome: Progressing   Problem: Coping: Goal: Level of anxiety will decrease Outcome: Progressing   Problem: Pain Managment: Goal: General experience of comfort will improve Outcome: Progressing   Problem: Safety: Goal: Ability to remain free from injury will improve Outcome: Progressing   Problem: Skin Integrity: Goal: Risk for impaired skin integrity will decrease Outcome: Progressing  Elesa Hacker, RN

## 2019-03-02 NOTE — Progress Notes (Signed)
Nutrition Follow-up  INTERVENTION:   -Ensure Enlive po BID, each supplement provides 350 kcal and 20 grams of protein  NUTRITION DIAGNOSIS:   Increased nutrient needs related to social / environmental circumstances(ETOH abuse) as evidenced by estimated needs.  Ongoing.  GOAL:   Patient will meet greater than or equal to 90% of their needs  Progressing.  MONITOR:   Labs, Weight trends, I & O's, Diet advancement  ASSESSMENT:   64 y.o. male with medical history significant of alcohol abuse, tobacco abuse brought by EMS and [facility] Department to emergency department for evaluation of recent fall.  **RD working remotely**  Per SLP evaluation on 1/2, recommend pt continue dysphagia 3 diet with thin liquids.  Pt has been consuming 0-50% of meals at this time.  Will order Ensure supplements for additional kcals and protein.   Admission weight: 140 lbs. Current weight: 117 lbs.  I/Os: -408 ml since admit  Medications: Folic acid tablet, MAG-OX tablet, Multivitamin with minerals daily, Thiamine tablet Labs reviewed: CBGs: 83-95  Diet Order:   Diet Order            DIET DYS 3 Room service appropriate? No; Fluid consistency: Thin  Diet effective now              EDUCATION NEEDS:   Not appropriate for education at this time  Skin:  Skin Assessment: Reviewed RN Assessment  Last BM:  1/5 -type 3  Height:   Ht Readings from Last 1 Encounters:  02/23/19 5\' 6"  (1.676 m)    Weight:   Wt Readings from Last 1 Encounters:  03/02/19 53.4 kg    Ideal Body Weight:  64.5 kg  BMI:  Body mass index is 19 kg/m.  Estimated Nutritional Needs:   Kcal:  1900-2100  Protein:  90-100g  Fluid:  2L/day  Clayton Bibles, MS, RD, LDN Inpatient Clinical Dietitian Pager: [phone] After Hours Pager: [phone]

## 2019-03-02 NOTE — Consult Note (Signed)
Telepsych Consultation   Reason for Consult:  "Refusing skilled nursing, assess for medical decision making" Referring Physician:  Dr Louanne Belton Location of Patient: [facility] [id number] Location of Provider: [facility]  Patient Identification: Edwin Torres MRN:  [id number] Principal Diagnosis: Frequent falls Diagnosis:  Principal Problem:   Frequent falls Active Problems:   Alcohol abuse with intoxication (HCC)   Increased anion gap metabolic acidosis   Macrocytic anemia   Tobacco abuse   Physical deconditioning   Alcohol intoxication ([place])   Thrombocytopenia (HCC)   Scalp laceration   Hypokalemia   Somnolence, daytime   Total Time spent with patient: 45 minutes  Subjective:   Edwin Torres is a 64 y.o. male patient admitted with frequent falls.patient assessed by nurse practitioner.  Patient alert, oriented to self during assessment. Patient appears slow to answer.  Patient denies suicidal and homicidal ideations.  Patient denies hallucinations.  Patient denies alcohol and substance use. Patient seen along with Dr. Dwyane Dee.  HPI: Patient admitted for frequent falls and alcohol use disorder.  Past Psychiatric History: Alcohol use disorder  Risk to Self:  Denies Risk to Others:  Denies Prior Inpatient Therapy:  Denies Prior Outpatient Therapy:  Denies  Past Medical History:  Past Medical History:  Diagnosis Date  . Alcoholic ([place])   . Seizures ([place])     Past Surgical History:  Procedure Laterality Date  . VASECTOMY     Family History:  Family History  Problem Relation Age of Onset  . Cancer Mother   . Diabetes Mother   . Hyperlipidemia Father    Family Psychiatric  History: Denies Social History:  Social History   Substance and Sexual Activity  Alcohol Use Yes  . Alcohol/week: 8.0 standard drinks  . Types: 8 Cans of beer per week   Comment: daily     Social History   Substance and Sexual Activity  Drug Use No    Social History    Socioeconomic History  . Marital status: Married    Spouse name: Not on file  . Number of children: Not on file  . Years of education: Not on file  . Highest education level: Not on file  Occupational History  . Not on file  Tobacco Use  . Smoking status: Current Every Day Smoker  . Smokeless tobacco: Current User  Substance and Sexual Activity  . Alcohol use: Yes    Alcohol/week: 8.0 standard drinks    Types: 8 Cans of beer per week    Comment: daily  . Drug use: No  . Sexual activity: Yes    Partners: Female    Birth control/protection: None  Other Topics Concern  . Not on file  Social History Narrative  . Not on file   Social Determinants of Health   Financial Resource Strain:   . Difficulty of Paying Living Expenses: Not on file  Food Insecurity:   . Worried About Charity fundraiser in the Last Year: Not on file  . Ran Out of Food in the Last Year: Not on file  Transportation Needs:   . Lack of Transportation (Medical): Not on file  . Lack of Transportation (Non-Medical): Not on file  Physical Activity:   . Days of Exercise per Week: Not on file  . Minutes of Exercise per Session: Not on file  Stress:   . Feeling of Stress : Not on file  Social Connections:   . Frequency of Communication with Friends and Family: Not on file  .  Frequency of Social Gatherings with Friends and Family: Not on file  . Attends Religious Services: Not on file  . Active Member of Clubs or Organizations: Not on file  . Attends Archivist Meetings: Not on file  . Marital Status: Not on file   Additional Social History:    Allergies:   Allergies  Allergen Reactions  . Penicillins Anaphylaxis    Has patient had a PCN reaction causing immediate rash, facial/tongue/throat swelling, SOB or lightheadedness with hypotension: Yes Has patient had a PCN reaction causing severe rash involving mucus membranes or skin necrosis: No Has patient had a PCN reaction that required  hospitalization: Yes Has patient had a PCN reaction occurring within the last 10 years: No If all of the above answers are "NO", then may proceed with Cephalosporin use.   . Ativan [Lorazepam] Other (See Comments)    Made him crazy    Labs:  Results for orders placed or performed during the hospital encounter of 02/22/19 (from the past 48 hour(s))  Basic metabolic panel     Status: Abnormal   Collection Time: 03/01/19  3:19 AM  Result Value Ref Range   Sodium 139 135 - 145 mmol/L   Potassium 4.3 3.5 - 5.1 mmol/L   Chloride 107 98 - 111 mmol/L   CO2 22 22 - 32 mmol/L   Glucose, Bld 106 (H) 70 - 99 mg/dL   BUN 6 (L) 8 - 23 mg/dL   Creatinine, Ser 0.55 (L) 0.61 - 1.24 mg/dL   Calcium 9.2 8.9 - 10.3 mg/dL   GFR calc non Af Amer >60 >60 mL/min   GFR calc Af Amer >60 >60 mL/min   Anion gap 10 5 - 15    Comment: Performed at [facility], [place] [address] [place], [place] [address]  CBC     Status: Abnormal   Collection Time: 03/01/19  3:19 AM  Result Value Ref Range   WBC 6.5 4.0 - 10.5 K/uL   RBC 2.90 (L) 4.22 - 5.81 MIL/uL   Hemoglobin 9.8 (L) 13.0 - 17.0 g/dL   HCT 29.1 (L) 39.0 - 52.0 %   MCV 100.3 (H) 80.0 - 100.0 fL   MCH 33.8 26.0 - 34.0 pg   MCHC 33.7 30.0 - 36.0 g/dL   RDW 12.6 11.5 - 15.5 %   Platelets 295 150 - 400 K/uL   nRBC 0.0 0.0 - 0.2 %    Comment: Performed at [facility], [place] [address], [place], [place] [address]  Magnesium     Status: None   Collection Time: 03/01/19  3:19 AM  Result Value Ref Range   Magnesium 2.0 1.7 - 2.4 mg/dL    Comment: Performed at [facility] [address], [place], [place] [address]  Glucose, capillary     Status: None   Collection Time: 03/01/19  7:50 AM  Result Value Ref Range   Glucose-Capillary 95 70 - 99 mg/dL  Basic metabolic panel     Status: None   Collection Time: 03/02/19  4:00 AM  Result Value Ref Range   Sodium 140 135 - 145 mmol/L   Potassium 4.3 3.5 - 5.1 mmol/L   Chloride 107 98 - 111  mmol/L   CO2 23 22 - 32 mmol/L   Glucose, Bld 95 70 - 99 mg/dL   BUN 10 8 - 23 mg/dL   Creatinine, Ser 0.68 0.61 - 1.24 mg/dL   Calcium 9.2 8.9 - 10.3 mg/dL  GFR calc non Af Amer >60 >60 mL/min   GFR calc Af Amer >60 >60 mL/min   Anion gap 10 5 - 15    Comment: Performed at [facility] [address], [place], [place] [address]  CBC     Status: Abnormal   Collection Time: 03/02/19  4:00 AM  Result Value Ref Range   WBC 6.7 4.0 - 10.5 K/uL   RBC 2.93 (L) 4.22 - 5.81 MIL/uL   Hemoglobin 9.8 (L) 13.0 - 17.0 g/dL   HCT 29.8 (L) 39.0 - 52.0 %   MCV 101.7 (H) 80.0 - 100.0 fL   MCH 33.4 26.0 - 34.0 pg   MCHC 32.9 30.0 - 36.0 g/dL   RDW 12.8 11.5 - 15.5 %   Platelets 343 150 - 400 K/uL   nRBC 0.0 0.0 - 0.2 %    Comment: Performed at [facility], [place] [address], [place], [place] [address]  Magnesium     Status: None   Collection Time: 03/02/19  4:00 AM  Result Value Ref Range   Magnesium 2.2 1.7 - 2.4 mg/dL    Comment: Performed at [facility] [address] [place], [place] [address]  Glucose, capillary     Status: None   Collection Time: 03/02/19  7:41 AM  Result Value Ref Range   Glucose-Capillary 83 70 - 99 mg/dL    Medications:  Current Facility-Administered Medications  Medication Dose Route Frequency Provider Last Rate Last Admin  . acetaminophen (TYLENOL) tablet 650 mg  650 mg Oral Q6H PRN Pahwani, Rinka R, MD   650 mg at 03/01/19 1056   Or  . acetaminophen (TYLENOL) suppository 650 mg  650 mg Rectal Q6H PRN Pahwani, Rinka R, MD      . chlordiazePOXIDE (LIBRIUM) capsule 25 mg  25 mg Oral Q6H PRN Pokhrel, Laxman, MD   25 mg at 03/01/19 0549  . enoxaparin (LOVENOX) injection 40 mg  40 mg Subcutaneous Q24H Pokhrel, Laxman, MD   40 mg at 03/01/19 1758  . folic acid (FOLVITE) tablet 1 mg  1 mg Oral Daily Pokhrel, Laxman, MD   1 mg at 03/02/19 0950  . loperamide (IMODIUM) capsule 2-4 mg  2-4 mg Oral PRN Pokhrel, Laxman, MD      . magnesium oxide (MAG-OX)  tablet 400 mg  400 mg Oral BID Pokhrel, Laxman, MD   400 mg at 03/02/19 0950  . MEDLINE mouth rinse  15 mL Mouth Rinse BID Tomma Rakers, MD   15 mL at 03/02/19 0959  . multivitamin with minerals tablet 1 tablet  1 tablet Oral Daily Domenic Moras, PA-C   1 tablet at 03/02/19 0950  . nicotine (NICODERM CQ - dosed in mg/24 hours) patch 21 mg  21 mg Transdermal Daily Blount, Scarlette Shorts T, NP   21 mg at 03/02/19 0958  . ondansetron (ZOFRAN) tablet 4 mg  4 mg Oral Q6H PRN Pahwani, Rinka R, MD       Or  . ondansetron (ZOFRAN) injection 4 mg  4 mg Intravenous Q6H PRN Pahwani, Rinka R, MD      . Derrill Memo ON 03/03/2019] potassium chloride SA (KLOR-CON) CR tablet 40 mEq  40 mEq Oral Daily Pokhrel, Laxman, MD      . thiamine tablet 100 mg  100 mg Oral Daily Pokhrel, Laxman, MD   100 mg at 03/02/19 [id number]    Musculoskeletal: Strength & Muscle Tone: Unable to assess Gait & Station: Unable to assess Patient  leans: Unable to assess  Psychiatric Specialty Exam: Physical Exam  Nursing note and vitals reviewed. Constitutional: He appears well-developed.  HENT:  Head: Normocephalic.  Cardiovascular: Normal rate.  Respiratory: Effort normal.  Neurological: He is alert.  Psychiatric: He has a normal mood and affect. Thought content normal. His speech is delayed. He is slowed. Cognition and memory are impaired. He exhibits abnormal recent memory.    Review of Systems  Constitutional: Negative.   HENT: Negative.   Eyes: Negative.   Respiratory: Negative.   Cardiovascular: Negative.   Gastrointestinal: Negative.   Genitourinary: Negative.   Musculoskeletal: Negative.   Skin: Negative.   Psychiatric/Behavioral: Positive for confusion.    Blood pressure 106/65, pulse 76, temperature 98.1 F (36.7 C), temperature source Oral, resp. rate 12, height 5\' 6"  (1.676 m), weight 53.4 kg, SpO2 98 %.Body mass index is 19 kg/m.  General Appearance: Casual  Eye Contact:  Fair  Speech:  Slow  Volume:  Normal   Mood:  Euthymic  Affect:  Congruent  Thought Process:  Coherent and Descriptions of Associations: Intact  Orientation:  Other:  Self only  Thought Content:  Logical  Suicidal Thoughts:  No  Homicidal Thoughts:  No  Memory:  Immediate;   Poor Recent;   Poor  Judgement:  Poor  Insight:  Lacking  Psychomotor Activity:  Normal  Concentration:  Concentration: Fair and Attention Span: Poor  Recall:  Poor  Fund of Knowledge:  Fair  Language:  Fair  Akathisia:  No  Handed:  Right  AIMS (if indicated):     Assets:  Communication Skills Desire for Improvement Financial Resources/Insurance Housing Social Support  ADL's: Unable to assess  Cognition:  Impaired,  Moderate  Sleep:        Treatment Plan Summary: Patient denies all crisis criteria at this time, does not meet inpatient criteria.  Patient appears confused at this time.  Patient does not appear to have capacity at this time to guide care plan.  Please reach out to next of kin for disposition assistance.  Disposition: No evidence of imminent risk to self or others at present.   Patient does not meet criteria for psychiatric inpatient admission.  This service was provided via telemedicine using a 2-way, interactive audio and video technology.  Names of all persons participating in this telemedicine service and their role in this encounter. Name: Rachel Moulds Role: Patient  Name: Letitia Libra Role: [place], FNP 03/02/2019 1:30 PM

## 2019-03-02 NOTE — Progress Notes (Addendum)
PROGRESS NOTE  Edwin Torres [id number] DOB: [birth year] DOA: 02/22/2019 PCP: Patient, No Pcp Per   LOS: 8 days   Brief narrative: As per HPI, Patient is a 64 year old male with past medical history of alcohol and tobacco abuse was brought in on late afternoon of 12/29 by EMS in [place] [employer] for a recent fall that occurred 3 hours prior that led to a large laceration on the left side of his head.  Patient did initially refuse to come in so his wife had called the emergency services and he was brought in.  His last drink of beer had been when the fall occurred.   During hospitalization, the nursing staff reported that there was asymmetry in the pupil so code stroke was called in.  Head CT was unremarkable.  Neurology was consulted.    Assessment/Plan:  Principal Problem:   Frequent falls Active Problems:   Alcohol abuse with intoxication (HCC)   Increased anion gap metabolic acidosis   Macrocytic anemia   Tobacco abuse   Physical deconditioning   Alcohol intoxication (HCC)   Thrombocytopenia (HCC)   Scalp laceration   Hypokalemia   Somnolence, daytime  Alcohol abuse with intoxication and withdrawal.  Improving.  On CIWA protocol. Mild tremors only without hallucinations On Librium. Ativan historically (as per the wife) caused agitation and somnolence. Continue thiamine and folic acid. MRI of the brain was performed which did not show any evidence of stroke. CT angiogram of the neck/head did not show any evidence of large vessel occlusion. EEG showed no evidence of seizures. Patient was seen by speech therapy who recommended dysphagia III diet.  Frequent falls-  Recent fall on 02/27/2019. Patient is deconditioned.  Will continue fall precautions.  Seen by physical therapy who recommended skilled nursing facility placement.  X-ray of the left knee did not show any acute abnormality.  Mild hypokalemia.  Improved with PO KCL.  Last magnesium of 1.8.  Left parietal scalp  hematoma/laceration. Staples in place.  Increased anion gap metabolic acidosis: on presentation, resolved.   Macrocytic anemia: Stable., Hemoglobin of 9.8 today. Will closely monitor.  No external bleeding   Tobacco abuse: Continue nicotine patch  Physical deconditioning, debility and falls: PT OT on board and recommended skilled nursing facility on discharge.  Will need continued physical therapy while in the hospital.   Thrombocytopenia .  Resolved.  Nutrition.  Seen by speech therapy on dysphagia 3 diet.   VTE Prophylaxis: Lovenox subcu  Code Status: Full code   Family Communication: Spoke with the patient's spouse Ms. Hazleton and updated her about the clinical condition of the patient.  I  updated Ms. Alroy Dust about the lack of decision-making capacity of Edwin Torres as determined by psychiatry.  Disposition Plan: Skilled nursing facility placement, pending.  Patient is medically stable for disposition if skilled nursing facility is available.  Spoke with the patient in detail today he is okay with a skilled nursing facility placement.  He was just upset that he has been in the hospital for a long time. I thought patient understood his clinical condition but Psych was  consulted at the request of the family and staff in the hospital and was determined that the patient lacked medical decision making capacity.   Consultants:  Neurology  Psychiatry  Procedures:  EEG  Antibiotics:  Anti-infectives (From admission, onward)   None     Subjective:  Today, patient feels ok. Denies nausea, vomiting. No shortness of breath.  Denies chest pain, palpitation.  Objective:  Vitals:   03/01/19 2119 03/02/19 0532  BP: 131/86 106/65  Pulse: 82 76  Resp:    Temp: 97.9 F (36.6 C) 98.1 F (36.7 C)  SpO2: 100% 98%    Intake/Output Summary (Last 24 hours) at 03/02/2019 1114 Last data filed at 03/02/2019 0950 Gross per 24 hour  Intake 240 ml  Output 700 ml  Net -460 ml    Filed Weights   02/28/19 0554 03/01/19 0557 03/02/19 0532  Weight: 56.4 kg 53.6 kg 53.4 kg   Body mass index is 19 kg/m.   Physical Exam: GENERAL: Patient is alert awake and communicative, oriented to time and person. Not in obvious distress.   thinly built, deconditioned HENT: No scleral pallor or icterus. Pupils equally reactive to light. Oral mucosa is mildly dry.  Scalp laceration. NECK: is supple, no palpable thyroid enlargement. CHEST: Clear to auscultation. Diminished breath sounds bilaterally. CVS: S1 and S2 heard, no murmur. Regular rate and rhythm. No pericardial rub. ABDOMEN: Soft, non-tender, bowel sounds are present. EXTREMITIES: No edema.  Left knee excoriation.  Scabs on the upper extremity. CNS: AAO x3,  No focal neurological deficits.  Slight right eyelid droop.  Mild tremor SKIN: warm and dry without rashes.  Data Review: I have personally reviewed the following laboratory data and studies,  CBC: Recent Labs  Lab 02/26/19 0314 02/27/19 0816 02/28/19 0638 03/01/19 0319 03/02/19 0400  WBC 6.0 6.3 6.3 6.5 6.7  HGB 8.4* 9.4* 9.6* 9.8* 9.8*  HCT 24.1* 27.6* 28.0* 29.1* 29.8*  MCV 98.4 100.0 99.6 100.3* 101.7*  PLT 153 219 275 295 [id number]   Basic Metabolic Panel: Recent Labs  Lab 02/26/19 0314 02/27/19 0816 02/28/19 0638 03/01/19 0319 03/02/19 0400  NA 134* 136 136 139 140  K 3.7 3.7 3.3* 4.3 4.3  CL 104 106 101 107 107  CO2 20* 20* 24 22 23   GLUCOSE 95 93 117* 106* 95  BUN 7* <5* <5* 6* 10  CREATININE 0.48* 0.43* 0.55* 0.55* 0.68  CALCIUM 8.2* 8.6* 8.9 9.2 9.2  MG 1.8 1.7 1.8 2.0 2.2  PHOS 2.8 3.2  --   --   --    Liver Function Tests: Recent Labs  Lab 02/24/19 2033 02/26/19 0314 02/27/19 0816 02/28/19 0638  AST 45* 37 41 32  ALT 23 21 24 24   ALKPHOS 104 87 99 97  BILITOT 1.3* 1.1 1.1 0.8  PROT 5.4* 5.3* 6.0* 6.0*  ALBUMIN 2.8* 2.7* 3.0* 2.9*   No results for input(s): LIPASE, AMYLASE in the last 168 hours. No results for input(s):  AMMONIA in the last 168 hours. Cardiac Enzymes: No results for input(s): CKTOTAL, CKMB, CKMBINDEX, TROPONINI in the last 168 hours. BNP (last 3 results) No results for input(s): BNP in the last 8760 hours.  ProBNP (last 3 results) No results for input(s): PROBNP in the last 8760 hours.  CBG: Recent Labs  Lab 02/26/19 0736 02/27/19 0746 02/28/19 0723 03/01/19 0750 03/02/19 0741  GLUCAP 92 92 137* 95 83   Recent Results (from the past 240 hour(s))  SARS CORONAVIRUS 2 (TAT 6-24 HRS) Nasopharyngeal Nasopharyngeal Swab     Status: None   Collection Time: 02/22/19  4:26 PM   Specimen: Nasopharyngeal Swab  Result Value Ref Range Status   SARS Coronavirus 2 NEGATIVE NEGATIVE Final    Comment: (NOTE) SARS-CoV-2 target nucleic acids are NOT DETECTED. The SARS-CoV-2 RNA is generally detectable in upper and lower respiratory specimens during the acute phase of infection. Negative results do not preclude  SARS-CoV-2 infection, do not rule out co-infections with other pathogens, and should not be used as the sole basis for treatment or other patient management decisions. Negative results must be combined with clinical observations, patient history, and epidemiological information. The expected result is Negative. Fact Sheet for Patients: SugarRoll.be Fact Sheet for Healthcare Providers: https://www.woods-mathews.com/ This test is not yet approved or cleared by the [place] FDA and  has been authorized for detection and/or diagnosis of SARS-CoV-2 by FDA under an Emergency Use Authorization (EUA). This EUA will remain  in effect (meaning this test can be used) for the duration of the COVID-19 declaration under Section 56 4(b)(1) of the Act, 21 U.S.C. section 360bbb-3(b)(1), unless the authorization is terminated or revoked sooner. Performed at [facility], [place] [address] [place], [place] [address]      Studies: No results  found.  Scheduled Meds: . enoxaparin (LOVENOX) injection  40 mg Subcutaneous Q24H  . folic acid  1 mg Oral Daily  . magnesium oxide  400 mg Oral BID  . mouth rinse  15 mL Mouth Rinse BID  . multivitamin with minerals  1 tablet Oral Daily  . nicotine  21 mg Transdermal Daily  . potassium chloride  40 mEq Oral BID  . thiamine  100 mg Oral Daily    Continuous Infusions:   Flora Lipps, MD  [facility] 03/02/2019

## 2019-03-02 NOTE — TOC Progression Note (Addendum)
Transition of Care [facility]) - Progression Note    Patient Details  Name: Edwin Torres MRN: [id number] Date of Birth: June 22, 1955  Transition of Care [facility]) CM/SW [place], [place] Phone Number: 03/02/2019, 4:19 PM  Clinical Narrative:      UPDATE: Helene Kelp has confirm bed offer- patient must be free of telesitter for 24 hrs, have covid test and insurance auth before discharge to SNF.  CSW spoke with the patient's spouse,Edwin Torres. CSW provided bed offers. Patient's wife chose [facility].  CSW contacted [facility] to confirm bed offer and availability. CSW waiting on response. CSW requested HTA to start insurance authorization.  Patient will need covid test.  CSW will continue to follow and assist with discharge planning.  Thurmond Butts, MSW, [place] Clinical Social Worker   Expected Discharge Plan: Skilled Nursing Facility Barriers to Discharge: Continued Medical Work up  Expected Discharge Plan and Services Expected Discharge Plan: [place] In-house Referral: NA   Post Acute Care Choice: [place] Living arrangements for the past 2 months: Single Family Home                                       Social Determinants of Health (SDOH) Interventions    Readmission Risk Interventions No flowsheet data found.

## 2019-03-02 NOTE — Plan of Care (Signed)
  Problem: Education: Goal: Knowledge of General Education information will improve Description: Including pain rating scale, medication(s)/side effects and non-pharmacologic comfort measures 03/02/2019 0214 by Elesa Hacker, RN Outcome: Progressing 03/02/2019 0209 by Elesa Hacker, RN Outcome: Progressing   Problem: Clinical Measurements: Goal: Ability to maintain clinical measurements within normal limits will improve Outcome: Progressing Goal: Will remain free from infection 03/02/2019 0214 by Elesa Hacker, RN Outcome: Progressing 03/02/2019 0209 by Elesa Hacker, RN Outcome: Progressing Goal: Diagnostic test results will improve Outcome: Progressing   Problem: Activity: Goal: Risk for activity intolerance will decrease Outcome: Progressing   Problem: Nutrition: Goal: Adequate nutrition will be maintained Outcome: Progressing   Problem: Coping: Goal: Level of anxiety will decrease 03/02/2019 0214 by Elesa Hacker, RN Outcome: Progressing 03/02/2019 0209 by Elesa Hacker, RN Outcome: Progressing   Problem: Pain Managment: Goal: General experience of comfort will improve 03/02/2019 0214 by Elesa Hacker, RN Outcome: Progressing 03/02/2019 0209 by Elesa Hacker, RN Outcome: Progressing   Problem: Safety: Goal: Ability to remain free from injury will improve 03/02/2019 0214 by Elesa Hacker, RN Outcome: Progressing 03/02/2019 0209 by Elesa Hacker, RN Outcome: Progressing   Problem: Skin Integrity: Goal: Risk for impaired skin integrity will decrease Outcome: Progressing  Elesa Hacker, RN

## 2019-03-03 DIAGNOSIS — Z23 Encounter for immunization: Secondary | ICD-10-CM | POA: Diagnosis not present

## 2019-03-03 DIAGNOSIS — E876 Hypokalemia: Secondary | ICD-10-CM | POA: Diagnosis not present

## 2019-03-03 DIAGNOSIS — R4189 Other symptoms and signs involving cognitive functions and awareness: Secondary | ICD-10-CM | POA: Diagnosis not present

## 2019-03-03 DIAGNOSIS — F10920 Alcohol use, unspecified with intoxication, uncomplicated: Secondary | ICD-10-CM

## 2019-03-03 DIAGNOSIS — D696 Thrombocytopenia, unspecified: Secondary | ICD-10-CM | POA: Diagnosis not present

## 2019-03-03 DIAGNOSIS — D539 Nutritional anemia, unspecified: Secondary | ICD-10-CM | POA: Diagnosis not present

## 2019-03-03 DIAGNOSIS — R296 Repeated falls: Secondary | ICD-10-CM | POA: Diagnosis not present

## 2019-03-03 DIAGNOSIS — F10129 Alcohol abuse with intoxication, unspecified: Secondary | ICD-10-CM | POA: Diagnosis not present

## 2019-03-03 DIAGNOSIS — S0101XA Laceration without foreign body of scalp, initial encounter: Secondary | ICD-10-CM | POA: Diagnosis not present

## 2019-03-03 DIAGNOSIS — R5381 Other malaise: Secondary | ICD-10-CM | POA: Diagnosis not present

## 2019-03-03 DIAGNOSIS — M6281 Muscle weakness (generalized): Secondary | ICD-10-CM | POA: Diagnosis not present

## 2019-03-03 DIAGNOSIS — R1310 Dysphagia, unspecified: Secondary | ICD-10-CM | POA: Diagnosis not present

## 2019-03-03 DIAGNOSIS — R29818 Other symptoms and signs involving the nervous system: Secondary | ICD-10-CM | POA: Diagnosis not present

## 2019-03-03 DIAGNOSIS — R41841 Cognitive communication deficit: Secondary | ICD-10-CM | POA: Diagnosis not present

## 2019-03-03 DIAGNOSIS — S0101XD Laceration without foreign body of scalp, subsequent encounter: Secondary | ICD-10-CM | POA: Diagnosis not present

## 2019-03-03 DIAGNOSIS — S0101XS Laceration without foreign body of scalp, sequela: Secondary | ICD-10-CM | POA: Diagnosis not present

## 2019-03-03 DIAGNOSIS — W1830XA Fall on same level, unspecified, initial encounter: Secondary | ICD-10-CM | POA: Diagnosis not present

## 2019-03-03 DIAGNOSIS — Z7401 Bed confinement status: Secondary | ICD-10-CM | POA: Diagnosis not present

## 2019-03-03 DIAGNOSIS — R1312 Dysphagia, oropharyngeal phase: Secondary | ICD-10-CM | POA: Diagnosis not present

## 2019-03-03 DIAGNOSIS — R400 Somnolence: Secondary | ICD-10-CM | POA: Diagnosis not present

## 2019-03-03 DIAGNOSIS — E872 Acidosis: Secondary | ICD-10-CM | POA: Diagnosis not present

## 2019-03-03 DIAGNOSIS — M2550 Pain in unspecified joint: Secondary | ICD-10-CM | POA: Diagnosis not present

## 2019-03-03 DIAGNOSIS — Z720 Tobacco use: Secondary | ICD-10-CM | POA: Diagnosis not present

## 2019-03-03 LAB — BASIC METABOLIC PANEL
Anion gap: 7 (ref 5–15)
BUN: 12 mg/dL (ref 8–23)
CO2: 25 mmol/L (ref 22–32)
Calcium: 9 mg/dL (ref 8.9–10.3)
Chloride: 104 mmol/L (ref 98–111)
Creatinine, Ser: 0.64 mg/dL (ref 0.61–1.24)
GFR calc Af Amer: >60 mL/min (ref >60)
GFR calc non Af Amer: >60 mL/min (ref >60)
Glucose, Bld: 135 mg/dL — ABNORMAL HIGH (ref 70–99)
Potassium: 3.8 mmol/L (ref 3.5–5.1)
Sodium: 136 mmol/L (ref 135–145)

## 2019-03-03 LAB — MAGNESIUM: Magnesium: 2.2 mg/dL (ref 1.7–2.4)

## 2019-03-03 LAB — SARS CORONAVIRUS 2 (TAT 6-24 HRS): SARS Coronavirus 2: NEGATIVE

## 2019-03-03 LAB — GLUCOSE, CAPILLARY: Glucose-Capillary: 112 mg/dL — ABNORMAL HIGH (ref 70–99)

## 2019-03-03 MED ORDER — FOLIC ACID 1 MG PO TABS: 1.0000 mg | ORAL_TABLET | Freq: Every day | ORAL | Status: DC

## 2019-03-03 MED ORDER — ENSURE ENLIVE PO LIQD: 237.0000 mL | Freq: Two times a day (BID) | ORAL | Status: DC

## 2019-03-03 MED ORDER — CHLORDIAZEPOXIDE HCL 25 MG PO CAPS
25.0000 mg | ORAL_CAPSULE | Freq: Four times a day (QID) | ORAL | Status: AC | PRN
  Administered 2019-03-03: 25 mg via ORAL
  Filled 2019-03-03: qty 1

## 2019-03-03 MED ORDER — NICOTINE 21 MG/24HR TD PT24: 21.0000 mg | MEDICATED_PATCH | Freq: Every day | TRANSDERMAL | 0 refills | Status: DC

## 2019-03-03 MED ORDER — THIAMINE HCL 100 MG PO TABS: 100.0000 mg | ORAL_TABLET | Freq: Every day | ORAL | Status: DC

## 2019-03-03 MED ORDER — ADULT MULTIVITAMIN W/MINERALS CH: 1.0000 | ORAL_TABLET | Freq: Every day | ORAL | Status: DC

## 2019-03-03 MED ORDER — MAGNESIUM OXIDE 400 (241.3 MG) MG PO TABS: 400.0000 mg | ORAL_TABLET | Freq: Two times a day (BID) | ORAL | Status: DC

## 2019-03-03 NOTE — Progress Notes (Signed)
Physical Therapy Treatment Patient Details Name: Edwin Torres MRN: [id number] DOB: [birth year] Today's Date: 03/03/2019    History of Present Illness 64yo male s/p fall at home, brought to ED by police due to refusal to seek medical care at home. CT head and cervical spine clear of acute fracture. Ethanol level 177 in ED. PMH alcohol and tobacco abuse, seizures    PT Comments    Patient received in bed asleep, pleasant and willing to participate in PT today. Mobility and cognition continue to improve, and he was able to perform bed mobility with min guard, functional transfers with minAx2/RW, and gait approximately 157f with min guardx2 for safety, fatigued but tolerated progression of mobility very well. He reports he is excited to participate in rehab, and was left in bed with all needs met and bed alarm active.     Follow Up Recommendations  SNF;Supervision/Assistance - 24 hour     Equipment Recommendations  Other (comment)(defer)    Recommendations for Other Services       Precautions / Restrictions Precautions Precautions: Fall;Other (comment) Precaution Comments: very unsteading; multidirectional balance loss Restrictions Weight Bearing Restrictions: No    Mobility  Bed Mobility Overal bed mobility: Needs Assistance Bed Mobility: Supine to Sit;Sit to Supine     Supine to sit: Supervision Sit to supine: Supervision   General bed mobility comments: S for safety  Transfers Overall transfer level: Needs assistance Equipment used: Rolling walker (2 wheeled) Transfers: Sit to/from Stand Sit to Stand: Min assist;+2 physical assistance         General transfer comment: MinAx2 for safety, able to balance much better in standing today, cues for hand placement  Ambulation/Gait Ambulation/Gait assistance: Min guard Gait Distance (Feet): 160 Feet Assistive device: Rolling walker (2 wheeled) Gait Pattern/deviations: Step-through pattern;Decreased step length -  right;Decreased step length - left;Decreased dorsiflexion - right;Decreased dorsiflexion - left;Drifts right/left;Narrow base of support Gait velocity: decreased   General Gait Details: min guardx2 for safety with RW, tremulous gait and cues for safe use of device, MinA to maintain balance on turns   SChief Strategy Officer   Modified Rankin (Stroke Patients Only)       Balance Overall balance assessment: Needs assistance Sitting-balance support: Bilateral upper extremity supported;Feet supported Sitting balance-Leahy Scale: Good Sitting balance - Comments: S for safety   Standing balance support: Bilateral upper extremity supported;During functional activity Standing balance-Leahy Scale: Poor Standing balance comment: reliant on external support and B UE support                            Cognition Arousal/Alertness: Awake/alert Behavior During Therapy: Flat affect Overall Cognitive Status: No family/caregiver present to determine baseline cognitive functioning Area of Impairment: Orientation;Attention;Memory;Following commands;Safety/judgement;Awareness;Problem solving                 Orientation Level: Disoriented to;Situation;Place Current Attention Level: Sustained Memory: Decreased recall of precautions;Decreased short-term memory Following Commands: Follows one step commands consistently;Follows one step commands with increased time Safety/Judgement: Decreased awareness of safety;Decreased awareness of deficits Awareness: Intellectual Problem Solving: Slow processing;Decreased initiation;Difficulty sequencing;Requires verbal cues;Requires tactile cues General Comments: cognition and alertness continues to improve, reports he is going to rehab later today, pleasant and followed cues well      Exercises      General Comments        Pertinent Vitals/Pain Pain Assessment: No/denies pain  Pain Score: 0-No pain Pain  Intervention(s): Limited activity within patient's tolerance;Monitored during session    Home Living                      Prior Function            PT Goals (current goals can now be found in the care plan section) Acute Rehab PT Goals Patient Stated Goal: unable to state PT Goal Formulation: Patient unable to participate in goal setting Time For Goal Achievement: 03/09/19 Potential to Achieve Goals: Fair Progress towards PT goals: Progressing toward goals    Frequency    Min 2X/week      PT Plan Current plan remains appropriate    Co-evaluation              AM-PAC PT "6 Clicks" Mobility   Outcome Measure  Help needed turning from your back to your side while in a flat bed without using bedrails?: A Little Help needed moving from lying on your back to sitting on the side of a flat bed without using bedrails?: A Little Help needed moving to and from a bed to a chair (including a wheelchair)?: A Little Help needed standing up from a chair using your arms (e.g., wheelchair or bedside chair)?: A Little Help needed to walk in hospital room?: A Little Help needed climbing 3-5 steps with a railing? : A Lot 6 Click Score: 17    End of Session Equipment Utilized During Treatment: Gait belt Activity Tolerance: Patient tolerated treatment well Patient left: in bed;with call bell/phone within reach;with bed alarm set Nurse Communication: Mobility status PT Visit Diagnosis: Unsteadiness on feet (R26.81);Difficulty in walking, not elsewhere classified (R26.2);Muscle weakness (generalized) (M62.81);History of falling (Z91.81);Other abnormalities of gait and mobility (R26.89)     Time: 4656-8127 PT Time Calculation (min) (ACUTE ONLY): 12 min  Charges:  $Gait Training: 8-22 mins                     Windell Norfolk, DPT, PN1   Supplemental Physical Therapist [place]    Pager [phone] Acute Rehab Office [phone]

## 2019-03-03 NOTE — TOC Transition Note (Signed)
Transition of Care [facility]) - CM/SW Discharge Note   Patient Details  Name: Edwin Torres MRN: [id number] Date of Birth: [birth year]  [facility] CM/SW Contact:  Alexander Mt, [place] Phone Number: 03/03/2019, 2:51 PM   Clinical Narrative:    CSW organized dc to SNF at [facility]. All information sent to SNF- [facility] called for 3pm. No controlled scripts needed.    Final next level of care: Skilled Nursing Facility Barriers to Discharge: Continued Medical Work up   Patient Goals and CMS Choice Patient states their goals for this hospitalization and ongoing recovery are:: "pt to go to rehab" -wife CMS Medicare.gov Compare Post Acute Care list provided to:: Patient Represenative (must comment)(pt spouse telephonically) Choice offered to / list presented to : Spouse  Discharge Placement PASRR number recieved: 03/03/19            Patient chooses bed at: [facility] Patient to be transferred to facility by: [facility] Name of family member notified: pt wife via telephone Patient and family notified of of transfer: 03/03/19  Discharge Plan and Services In-house Referral: NA Post Acute Care Choice: [place]            Readmission Risk Interventions No flowsheet data found.

## 2019-03-03 NOTE — TOC Progression Note (Addendum)
Transition of Care [facility]) - Progression Note    Patient Details  Name: Edwin Torres MRN: [id number] Date of Birth: 1955/06/21  Transition of Care [facility]) CM/SW [place], [place] Phone Number: 03/03/2019, 11:35 AM  Clinical Narrative:    12:23pm- CSW spoke with [facility], they have bed. Pt auth received [phone]), provided pt wife information for paperwork to be completed for admissions. Will alert MD that all has been completed.   11:35am- CSW spoke with [facility]. Bed available. Will f/u with pt insurance company regarding authorization.      Expected Discharge Plan: [place] Barriers to Discharge: Continued Medical Work up  Expected Discharge Plan and Services Expected Discharge Plan: [place] In-house Referral: NA   Post Acute Care Choice: [place] Living arrangements for the past 2 months: Single Family Home  Readmission Risk Interventions No flowsheet data found.

## 2019-03-03 NOTE — Social Work (Signed)
Clinical Social Worker facilitated patient discharge including contacting patient family and facility to confirm patient discharge plans.  Clinical information faxed to facility and family agreeable with plan.  CSW arranged ambulance transport via [facility] to [facility]  RN to call [phone] with report prior to discharge.  Clinical Social Worker will sign off for now as social work intervention is no longer needed. Please consult us again if new need arises.  Martyna Thorns, MSW, LCSWA Clinical Social Worker   

## 2019-03-03 NOTE — Progress Notes (Signed)
Report given to Ellison Hughs, RN at facility.

## 2019-03-03 NOTE — Progress Notes (Signed)
Attempted to call report to receiving facility. Receiving RN had temporarily left the unit and will call me back ASAP.

## 2019-03-03 NOTE — NC FL2 (Signed)
[place] MEDICAID FL2 LEVEL OF CARE SCREENING TOOL     IDENTIFICATION  Patient Name: Edwin Torres Birthdate: 09-04-1955 Sex: male Admission Date (Current Location): 02/22/2019  [facility] and [place] Number:  Herbalist and Address:  [facility], [place] [address] [place], [place] [address]      Provider Number: [id number]  Attending Physician Name and Address:  Flora Lipps, MD  Relative Name and Phone Number:       Current Level of Care: Hospital Recommended Level of Care: [place] Prior Approval Number:    Date Approved/Denied:   PASRR Number: [id number]  Discharge Plan: SNF    Current Diagnoses: Patient Active Problem List   Diagnosis Date Noted  . Hypokalemia 02/24/2019  . Somnolence, daytime 02/24/2019  . Alcohol abuse with intoxication ([place]) 02/22/2019  . Frequent falls 02/22/2019  . Increased anion gap metabolic acidosis [id number]  . Macrocytic anemia 02/22/2019  . Tobacco abuse 02/22/2019  . Physical deconditioning 02/22/2019  . Alcohol intoxication ([place]) 02/22/2019  . Thrombocytopenia ([place]) 02/22/2019  . Scalp laceration 02/22/2019  . Visit for suture removal 08/20/2016  . Weakness of right third cranial nerve 03/21/2016    Orientation RESPIRATION BLADDER Height & Weight     Self, Situation, Place  Normal Continent Weight: 114 lb 4.8 oz (51.8 kg) Height:  5\' 6"  (167.6 cm)  BEHAVIORAL SYMPTOMS/MOOD NEUROLOGICAL BOWEL NUTRITION STATUS      Continent Diet(see discharge summary)  AMBULATORY STATUS COMMUNICATION OF NEEDS Skin   Extensive Assist Verbally Skin abrasions, Other (Comment)(skin tear left knee, generalized ecchymosis, abrasions on arms and legs)                       Personal Care Assistance Level of Assistance  Bathing, Feeding, Dressing Bathing Assistance: Maximum assistance Feeding assistance: Independent Dressing Assistance: Maximum assistance     Functional Limitations  Info  Sight, Speech, Hearing Sight Info: Adequate Hearing Info: Adequate Speech Info: Adequate    SPECIAL CARE FACTORS FREQUENCY  OT (By licensed OT), PT (By licensed PT)     PT Frequency: 5x week OT Frequency: 5x week            Contractures Contractures Info: Not present    Additional Factors Info  Code Status, Allergies Code Status Info: full code Allergies Info: Penicillins, Ativan (Lorazepam)           Current Medications (03/03/2019):  This is the current hospital active medication list Current Facility-Administered Medications  Medication Dose Route Frequency Provider Last Rate Last Admin  . acetaminophen (TYLENOL) tablet 650 mg  650 mg Oral Q6H PRN Pahwani, Rinka R, MD   650 mg at 03/03/19 1114   Or  . acetaminophen (TYLENOL) suppository 650 mg  650 mg Rectal Q6H PRN Pahwani, Rinka R, MD      . enoxaparin (LOVENOX) injection 40 mg  40 mg Subcutaneous Q24H Pokhrel, Laxman, MD   40 mg at 03/02/19 1843  . feeding supplement (ENSURE ENLIVE) (ENSURE ENLIVE) liquid 237 mL  237 mL Oral BID BM Pokhrel, Laxman, MD   237 mL at 03/03/19 0926  . folic acid (FOLVITE) tablet 1 mg  1 mg Oral Daily Pokhrel, Laxman, MD   1 mg at 03/03/19 0925  . magnesium oxide (MAG-OX) tablet 400 mg  400 mg Oral BID Pokhrel, Laxman, MD   400 mg at 03/03/19 0925  . MEDLINE mouth rinse  15 mL Mouth Rinse BID Tomma Rakers, MD  15 mL at 03/03/19 0926  . multivitamin with minerals tablet 1 tablet  1 tablet Oral Daily Domenic Moras, PA-C   1 tablet at 03/03/19 0925  . nicotine (NICODERM CQ - dosed in mg/24 hours) patch 21 mg  21 mg Transdermal Daily Lovey Newcomer T, NP   21 mg at 03/03/19 0925  . ondansetron (ZOFRAN) tablet 4 mg  4 mg Oral Q6H PRN Pahwani, Rinka R, MD       Or  . ondansetron (ZOFRAN) injection 4 mg  4 mg Intravenous Q6H PRN Pahwani, Rinka R, MD      . potassium chloride SA (KLOR-CON) CR tablet 40 mEq  40 mEq Oral Daily Pokhrel, Laxman, MD   40 mEq at 03/03/19 0925  . thiamine  tablet 100 mg  100 mg Oral Daily Pokhrel, Laxman, MD   100 mg at 03/03/19 [id number]     Discharge Medications: Please see discharge summary for a list of discharge medications.  Relevant Imaging Results:  Relevant Lab Results:   Additional Information [address] [place], [place]

## 2019-03-03 NOTE — Discharge Summary (Signed)
Physician Discharge Summary  Edwin Torres [id number] DOB: [birth year] DOA: 02/22/2019  PCP: Edwin Torres, No Pcp Per  Admit date: 02/22/2019 Discharge date: 03/03/2019  Admitted From: Home  Discharge disposition: SNF   Recommendations for Outpatient Follow-Up:   Follow up with your primary care provider at SNF in 3-5 days. Check CBC BMP at that time.  Discharge Diagnosis:   Principal Problem:   Frequent falls Active Problems:   Alcohol abuse with intoxication (HCC)   Increased anion gap metabolic acidosis   Macrocytic anemia   Tobacco abuse   Physical deconditioning   Alcohol intoxication (HCC)   Thrombocytopenia (HCC)   Scalp laceration   Hypokalemia   Somnolence, daytime   Discharge Condition: Improved.  Diet recommendation Regular.  Wound care: None.  Code status: Full.   History of Present Illness:   Edwin Torres is a 64 year old male with past medical history of alcohol and tobacco abuse was brought in on late afternoon of 12/29 by EMS in [place] [employer] for a recent fall that occurred 3 hours prior that led to a large laceration on the left side of his head. Edwin Torres did initially refuse to come in so his wife had called the emergency services and he was brought in. His last drink of beer had been when the fall occurred.  During hospitalization, the nursing staff reported that there was asymmetry in the pupil so code stroke was called in. Head CT was unremarkable. Neurology was consulted.   Hospital Course:  Following conditions were addressed during hospitalization as listed below, follow-up plan in italics.  Alcohol abuse with intoxication and withdrawal.  Improved. Received Librium Prn.  Unable to  tolerate Ativan.. Ativan historically (as per the wife) caused agitation and somnolence. Continue thiamine, MVI and folic acid on discharge.  If Edwin Torres is agitated could try Librium which works for the Edwin Torres.  MRI of the brain was performed which did not show any  evidence of stroke. CT angiogram of the neck/head did not show any evidence of large vessel occlusion. EEG showed no evidence of seizures. Edwin Torres was seen by speech therapy who recommended dysphagia III diet.  Frequent falls-Edwin Torres is debilitated and deconditioned.   fall precautions.  Seen by physical therapy who recommended skilled nursing facility placement.  X-ray of the left knee did not show any acute abnormality.  Continue physical therapy.  Mild hypokalemia.  Improved with PO KCL.  Latest potassium of 3.8.  Last magnesium of 1.8.  Follow BMP in 3 to 5 days.  Left parietal scalp hematoma/laceration.  supportive care  Increased anion gap metabolic acidosis: on presentation, resolved.  Macrocytic anemia: Stable.,  Latest hemoglobin of 9.8 today. Follow CBC 3 to 5 days  Tobacco abuse: Continue nicotine patch.  Physical deconditioning, debility and falls:  Edwin Torres will be discharged to skilled nursing facility today.  Thrombocytopenia .  Resolved.  Nutrition.  Seen by speech therapy on dysphagia 3 diet.  Advance as tolerated.  Disposition.  At this time, Edwin Torres is stable for disposition to skilled nursing facility.  I spoke with the Edwin Torres's spouse on the phone yesterday regarding disposition.  Medical Consultants:    Neurology  Psychiatry  Procedures:     EEG Subjective:   Today, Edwin Torres feels okay.  Denies any nausea vomiting shortness of breath chest pain.  Discharge Exam:   Vitals:   03/03/19 0303 03/03/19 1200  BP: 120/78 107/72  Pulse: 67 76  Resp:    Temp: 98.4 F (36.9 C) 98.1 F (36.7 C)  SpO2: 100% 98%   Vitals:   03/02/19 1533 03/02/19 2057 03/03/19 0303 03/03/19 1200  BP: 96/79 113/74 120/78 107/72  Pulse: 79 76 67 76  Resp: 16     Temp: 98 F (36.7 C) 98.2 F (36.8 C) 98.4 F (36.9 C) 98.1 F (36.7 C)  TempSrc: Oral Oral Oral Oral  SpO2: 100% 100% 100% 98%  Weight:   51.8 kg   Height:       GENERAL: Edwin Torres is alert  awake and communicative, oriented to time and person. Not in obvious distress.   thinly built, deconditioned HENT: No scleral pallor or icterus. Pupils equally reactive to light. Oral mucosa is moist scalp laceration. NECK: is supple, no palpable thyroid enlargement. CHEST: Clear to auscultation. Diminished breath sounds bilaterally. CVS: S1 and S2 heard, no murmur. Regular rate and rhythm. No pericardial rub. ABDOMEN: Soft, non-tender, bowel sounds are present. EXTREMITIES: No edema.  Left knee excoriation.  Dried scabs on the upper extremity. CNS: AAO x3,  No focal neurological deficits.  Slight right eyelid droop.  Mild tremor SKIN: warm and dry   The results of significant diagnostics from this hospitalization (including imaging, microbiology, ancillary and laboratory) are listed below for reference.     Diagnostic Studies:   CT Head Wo Contrast  Result Date: 02/22/2019 CLINICAL DATA:  Head trauma, minor, normal mental status. Additional provided: Fall EXAM: CT HEAD WITHOUT CONTRAST CT CERVICAL SPINE WITHOUT CONTRAST TECHNIQUE: Multidetector CT imaging of the head and cervical spine was performed following the standard protocol without intravenous contrast. Multiplanar CT image reconstructions of the cervical spine were also generated. COMPARISON:  Head CT 07/16/2017, CT cervical spine 06/22/2017 qa FINDINGS: CT HEAD FINDINGS Brain: No evidence of acute intracranial hemorrhage. No demarcated cortical infarction. No midline shift or extra-axial fluid collection. Age advanced generalized parenchymal atrophy. Ill-defined hypoattenuation within the cerebral white matter is nonspecific, but consistent with chronic small vessel ischemic disease. Redemonstrated chronic left basal ganglia lacunar infarct. Unchanged 10 mm extra-axial calcified focus along the left frontal convexity, which may reflect a small incidentally calcified meningioma. Vascular: No hyperdense vessel.  Atherosclerotic  calcifications. Skull: Normal. Negative for fracture or focal lesion. Sinuses/Orbits: Visualized orbits demonstrate no acute abnormality. No significant paranasal sinus disease at the imaged levels. Small bilateral mastoid effusions. Other: Left-sided scalp hematoma/laceration. CT CERVICAL SPINE FINDINGS Alignment: Partially visualized thoracic levocurvature with cervicothoracic dextrocurvature. No significant spondylolisthesis. Skull base and vertebrae: The basion-dental and atlanto-dental intervals are maintained.No evidence of acute fracture to the cervical spine. Soft tissues and spinal canal: No prevertebral fluid or swelling. No visible canal hematoma. Carotid artery atherosclerotic disease Disc levels: Cervical spondylosis with multilevel posterior disc osteophytes, uncovertebral and facet hypertrophy. Disc height loss is severe at the C6-C7 through T1-T2 levels. Multilevel ventral osteophytes. Partial degenerative fusion of the posterior elements at C2-C3. Upper chest: No consolidation within the imaged lung apices. No visible pneumothorax. IMPRESSION: CT head: 1. No evidence of acute intracranial abnormality. Left-sided scalp hematoma/laceration. 2. Age advanced generalized parenchymal atrophy with chronic small vessel ischemic disease. 3. Unchanged 10 mm extra-axial calcified focus along the left frontal convexity, possibly reflecting a small incidental meningioma. 4. Small bilateral mastoid effusions. CT cervical spine: 1. No evidence of acute fracture to the cervical spine. 2. Cervical spondylosis as described. Electronically Signed   By: Kellie Simmering DO   On: 02/22/2019 07:57   CT Cervical Spine Wo Contrast  Result Date: 02/22/2019 CLINICAL DATA:  Head trauma, minor, normal mental status. Additional provided: Fall  EXAM: CT HEAD WITHOUT CONTRAST CT CERVICAL SPINE WITHOUT CONTRAST TECHNIQUE: Multidetector CT imaging of the head and cervical spine was performed following the standard protocol without  intravenous contrast. Multiplanar CT image reconstructions of the cervical spine were also generated. COMPARISON:  Head CT 07/16/2017, CT cervical spine 06/22/2017 qa FINDINGS: CT HEAD FINDINGS Brain: No evidence of acute intracranial hemorrhage. No demarcated cortical infarction. No midline shift or extra-axial fluid collection. Age advanced generalized parenchymal atrophy. Ill-defined hypoattenuation within the cerebral white matter is nonspecific, but consistent with chronic small vessel ischemic disease. Redemonstrated chronic left basal ganglia lacunar infarct. Unchanged 10 mm extra-axial calcified focus along the left frontal convexity, which may reflect a small incidentally calcified meningioma. Vascular: No hyperdense vessel.  Atherosclerotic calcifications. Skull: Normal. Negative for fracture or focal lesion. Sinuses/Orbits: Visualized orbits demonstrate no acute abnormality. No significant paranasal sinus disease at the imaged levels. Small bilateral mastoid effusions. Other: Left-sided scalp hematoma/laceration. CT CERVICAL SPINE FINDINGS Alignment: Partially visualized thoracic levocurvature with cervicothoracic dextrocurvature. No significant spondylolisthesis. Skull base and vertebrae: The basion-dental and atlanto-dental intervals are maintained.No evidence of acute fracture to the cervical spine. Soft tissues and spinal canal: No prevertebral fluid or swelling. No visible canal hematoma. Carotid artery atherosclerotic disease Disc levels: Cervical spondylosis with multilevel posterior disc osteophytes, uncovertebral and facet hypertrophy. Disc height loss is severe at the C6-C7 through T1-T2 levels. Multilevel ventral osteophytes. Partial degenerative fusion of the posterior elements at C2-C3. Upper chest: No consolidation within the imaged lung apices. No visible pneumothorax. IMPRESSION: CT head: 1. No evidence of acute intracranial abnormality. Left-sided scalp hematoma/laceration. 2. Age  advanced generalized parenchymal atrophy with chronic small vessel ischemic disease. 3. Unchanged 10 mm extra-axial calcified focus along the left frontal convexity, possibly reflecting a small incidental meningioma. 4. Small bilateral mastoid effusions. CT cervical spine: 1. No evidence of acute fracture to the cervical spine. 2. Cervical spondylosis as described. Electronically Signed   By: Kellie Simmering DO   On: 02/22/2019 07:57     Labs:   Basic Metabolic Panel: Recent Labs  Lab 02/26/19 [id number] 02/27/19 0816 02/28/19 [id number] 03/01/19 0319 03/02/19 0400 03/03/19 0400  NA 134* 136 136 139 140 136  K 3.7 3.7 3.3* 4.3 4.3 3.8  CL 104 106 101 107 107 104  CO2 20* 20* 24 22 23 25   GLUCOSE 95 93 117* 106* 95 135*  BUN 7* <5* <5* 6* 10 12  CREATININE 0.48* 0.43* 0.55* 0.55* 0.68 0.64  CALCIUM 8.2* 8.6* 8.9 9.2 9.2 9.0  MG 1.8 1.7 1.8 2.0 2.2 2.2  PHOS 2.8 3.2  --   --   --   --    GFR Estimated Creatinine Clearance: 68.3 mL/min (by C-G formula based on SCr of 0.64 mg/dL). Liver Function Tests: Recent Labs  Lab 02/24/19 2033 02/26/19 0314 02/27/19 0816 02/28/19 0638  AST 45* 37 41 32  ALT 23 21 24 24   ALKPHOS 104 87 99 97  BILITOT 1.3* 1.1 1.1 0.8  PROT 5.4* 5.3* 6.0* 6.0*  ALBUMIN 2.8* 2.7* 3.0* 2.9*   No results for input(s): LIPASE, AMYLASE in the last 168 hours. No results for input(s): AMMONIA in the last 168 hours. Coagulation profile No results for input(s): INR, PROTIME in the last 168 hours.  CBC: Recent Labs  Lab 02/26/19 0314 02/27/19 0816 02/28/19 0638 03/01/19 0319 03/02/19 0400  WBC 6.0 6.3 6.3 6.5 6.7  HGB 8.4* 9.4* 9.6* 9.8* 9.8*  HCT 24.1* 27.6* 28.0* 29.1* 29.8*  MCV 98.4 100.0  99.6 100.3* 101.7*  PLT 153 219 275 295 343   Cardiac Enzymes: No results for input(s): CKTOTAL, CKMB, CKMBINDEX, TROPONINI in the last 168 hours. BNP: Invalid input(s): POCBNP CBG: Recent Labs  Lab 02/27/19 0746 02/28/19 0723 03/01/19 0750 03/02/19 0741  03/03/19 0801  GLUCAP 92 137* 95 83 112*   D-Dimer No results for input(s): DDIMER in the last 72 hours. Hgb A1c No results for input(s): HGBA1C in the last 72 hours. Lipid Profile No results for input(s): CHOL, HDL, LDLCALC, TRIG, CHOLHDL, LDLDIRECT in the last 72 hours. Thyroid function studies No results for input(s): TSH, T4TOTAL, T3FREE, THYROIDAB in the last 72 hours.  Invalid input(s): FREET3 Anemia work up No results for input(s): VITAMINB12, FOLATE, FERRITIN, TIBC, IRON, RETICCTPCT in the last 72 hours. Microbiology Recent Results (from the past 240 hour(s))  SARS CORONAVIRUS 2 (TAT 6-24 HRS) Nasopharyngeal Nasopharyngeal Swab     Status: None   Collection Time: 02/22/19  4:26 PM   Specimen: Nasopharyngeal Swab  Result Value Ref Range Status   SARS Coronavirus 2 NEGATIVE NEGATIVE Final    Comment: (NOTE) SARS-CoV-2 target nucleic acids are NOT DETECTED. The SARS-CoV-2 RNA is generally detectable in upper and lower respiratory specimens during the acute phase of infection. Negative results do not preclude SARS-CoV-2 infection, do not rule out co-infections with other pathogens, and should not be used as the sole basis for treatment or other Edwin Torres management decisions. Negative results must be combined with clinical observations, Edwin Torres history, and epidemiological information. The expected result is Negative. Fact Sheet for Patients: SugarRoll.be Fact Sheet for Healthcare Providers: https://www.woods-mathews.com/ This test is not yet approved or cleared by the [place] FDA and  has been authorized for detection and/or diagnosis of SARS-CoV-2 by FDA under an Emergency Use Authorization (EUA). This EUA will remain  in effect (meaning this test can be used) for the duration of the COVID-19 declaration under Section 56 4(b)(1) of the Act, 21 U.S.C. section 360bbb-3(b)(1), unless the authorization is terminated or revoked  sooner. Performed at [facility], [place] [address] [place], [place] [address]   SARS CORONAVIRUS 2 (TAT 6-24 HRS) Nasopharyngeal Nasopharyngeal Swab     Status: None   Collection Time: 03/02/19  9:15 PM   Specimen: Nasopharyngeal Swab  Result Value Ref Range Status   SARS Coronavirus 2 NEGATIVE NEGATIVE Final    Comment: (NOTE) SARS-CoV-2 target nucleic acids are NOT DETECTED. The SARS-CoV-2 RNA is generally detectable in upper and lower respiratory specimens during the acute phase of infection. Negative results do not preclude SARS-CoV-2 infection, do not rule out co-infections with other pathogens, and should not be used as the sole basis for treatment or other Edwin Torres management decisions. Negative results must be combined with clinical observations, Edwin Torres history, and epidemiological information. The expected result is Negative. Fact Sheet for Patients: SugarRoll.be Fact Sheet for Healthcare Providers: https://www.woods-mathews.com/ This test is not yet approved or cleared by the [place] FDA and  has been authorized for detection and/or diagnosis of SARS-CoV-2 by FDA under an Emergency Use Authorization (EUA). This EUA will remain  in effect (meaning this test can be used) for the duration of the COVID-19 declaration under Section 56 4(b)(1) of the Act, 21 U.S.C. section 360bbb-3(b)(1), unless the authorization is terminated or revoked sooner. Performed at [facility], [place] [address], [place],  [address]      Discharge Instructions:   Discharge Instructions    Diet general   Complete by: As directed  Dysphagia III diet, advance as tolerated   Discharge instructions   Complete by: As directed    Follow-up with your primary care provider at the skilled nursing facility in 3 to 5 days.  No alcohol please   Increase activity slowly   Complete by: As directed    As per physical therapy at skilled  nursing facility.     Allergies as of 03/03/2019      Reactions   Penicillins Anaphylaxis   Has Edwin Torres had a PCN reaction causing immediate rash, facial/tongue/throat swelling, SOB or lightheadedness with hypotension: Yes Has Edwin Torres had a PCN reaction causing severe rash involving mucus membranes or skin necrosis: No Has Edwin Torres had a PCN reaction that required hospitalization: Yes Has Edwin Torres had a PCN reaction occurring within the last 10 years: No If all of the above answers are "NO", then may proceed with Cephalosporin use.   Ativan [lorazepam] Other (See Comments)   Made him crazy      Medication List    TAKE these medications   feeding supplement (ENSURE ENLIVE) Liqd Take 237 mLs by mouth 2 (two) times daily between meals.   folic acid 1 MG tablet Commonly known as: FOLVITE Take 1 tablet (1 mg total) by mouth daily. Start taking on: March 04, 2019   magnesium oxide 400 (241.3 Mg) MG tablet Commonly known as: MAG-OX Take 1 tablet (400 mg total) by mouth 2 (two) times daily.   multivitamin with minerals Tabs tablet Take 1 tablet by mouth daily. Start taking on: March 04, 2019   nicotine 21 mg/24hr patch Commonly known as: NICODERM CQ - dosed in mg/24 hours Place 1 patch (21 mg total) onto the skin daily. Start taking on: March 04, 2019   thiamine 100 MG tablet Take 1 tablet (100 mg total) by mouth daily. Start taking on: March 04, 2019      Contact information for after-discharge care    Destination    [facility] .   Service: Skilled Nursing Contact information: [id number] [phone]              Time coordinating discharge: 39 minutes  Signed:  Leaner Morici  [facility] 03/03/2019, 12:43 PM

## 2019-03-04 ENCOUNTER — Non-Acute Institutional Stay (SKILLED_NURSING_FACILITY): Payer: PPO | Admitting: Adult Health

## 2019-03-04 ENCOUNTER — Encounter: Payer: Self-pay | Admitting: Adult Health

## 2019-03-04 DIAGNOSIS — R29818 Other symptoms and signs involving the nervous system: Secondary | ICD-10-CM

## 2019-03-04 DIAGNOSIS — D539 Nutritional anemia, unspecified: Secondary | ICD-10-CM | POA: Diagnosis not present

## 2019-03-04 DIAGNOSIS — R296 Repeated falls: Secondary | ICD-10-CM | POA: Diagnosis not present

## 2019-03-04 DIAGNOSIS — R4189 Other symptoms and signs involving cognitive functions and awareness: Secondary | ICD-10-CM

## 2019-03-04 DIAGNOSIS — E876 Hypokalemia: Secondary | ICD-10-CM | POA: Diagnosis not present

## 2019-03-04 DIAGNOSIS — S0101XS Laceration without foreign body of scalp, sequela: Secondary | ICD-10-CM

## 2019-03-04 DIAGNOSIS — F10129 Alcohol abuse with intoxication, unspecified: Secondary | ICD-10-CM

## 2019-03-04 DIAGNOSIS — R1310 Dysphagia, unspecified: Secondary | ICD-10-CM | POA: Diagnosis not present

## 2019-03-04 DIAGNOSIS — Z720 Tobacco use: Secondary | ICD-10-CM

## 2019-03-04 NOTE — Progress Notes (Signed)
Location:  [facility] Room Number: 303/A Place of Service:  SNF (31) Provider:  Durenda Age, DNP, FNP-BC  Patient Care Team: Patient, No Pcp Per as PCP - General (General Practice)  Extended Emergency Contact Information Primary Emergency Contact: Marengo,Michelle Address: [address]          [place], [place] [address] [place] Phone: [phone] Relation: Spouse  Code Status:  Full Code  Goals of care: Advanced Directive information Advanced Directives 03/04/2019  Does Patient Have a Medical Advance Directive? Yes  Type of Advance Directive (No Data)  Does patient want to make changes to medical advance directive? No - Patient declined  Would patient like information on creating a medical advance directive? -     Chief Complaint  Patient presents with  . Acute Visit    Hospitalization Follow Up    HPI:  Pt is a 64 y.o. male who was admitted to [facility] on 03/03/19 from hospitalization 02/22/19 to 03/03/19 for frequent falls. He had fallen for 3 hours before he was brought in to the hospital. He sustained a large laceration on his left parietal scalp. He initially refused to go to the hospital so his wife called the emergency services so he was brought to ED by EMS and [facility] PD. Head CT was unremarkable. MRI of the brain did not show any evidence of strike. CT angiogram of the neck/head did not show any evidence of large vessel occlusion. EEG showed no evidence of seizures. Neurology was consulted. X-ray of the left knee did not show any acute abnormality. He drinks 4-5 beers per day for many years and smokes 1 pack of cigarettes per day. He denies having depression but wife reported that he has been depressed and has poor appetite. He has not seen his PCP in years.  He was seen in his room today. His right eye noted to be close but opens up when asked to. He denies having vision problems nor any pain.  He has been admitted for a short term rehabilitation   Past Medical History:  Diagnosis Date  . Alcoholic ([place])   . Seizures ([place])    Past Surgical History:  Procedure Laterality Date  . VASECTOMY      Allergies  Allergen Reactions  . Penicillins Anaphylaxis    Has patient had a PCN reaction causing immediate rash, facial/tongue/throat swelling, SOB or lightheadedness with hypotension: Yes Has patient had a PCN reaction causing severe rash involving mucus membranes or skin necrosis: No Has patient had a PCN reaction that required hospitalization: Yes Has patient had a PCN reaction occurring within the last 10 years: No If all of the above answers are "NO", then may proceed with Cephalosporin use.   . Ativan [Lorazepam] Other (See Comments)    Made him crazy    Outpatient Encounter Medications as of 03/04/2019  Medication Sig  . bisacodyl (DULCOLAX) 10 MG suppository If not relieved by MOM, give 10 mg Bisacodyl suppositiory rectally X 1 dose in 24 hours as needed (Do not use constipation standing orders for residents with renal failure/CFR less than 30. Contact MD for orders) (Physician Order)  . feeding supplement, ENSURE ENLIVE, (ENSURE ENLIVE) LIQD Take 237 mLs by mouth 2 (two) times daily between meals.  . folic acid (FOLVITE) 1 MG tablet Take 1 tablet (1 mg total) by mouth daily.  . magnesium hydroxide (MILK OF MAGNESIA) 400 MG/5ML suspension If no BM in 3 days, give 30  cc Milk of Magnesium p.o. x 1 dose in 24 hours as needed (Do not use standing constipation orders for residents with renal failure CFR less than 30. Contact MD for orders) (Physician Order)  . magnesium oxide (MAG-OX) 400 (241.3 Mg) MG tablet Take 1 tablet (400 mg total) by mouth 2 (two) times daily.  . Multiple Vitamin (MULTIVITAMIN WITH MINERALS) TABS tablet Take 1 tablet by mouth daily.  . nicotine (NICODERM CQ - DOSED IN MG/24 HOURS) 14 mg/24hr patch Place 14 mg onto the skin daily. Remove old patch prior to  placing the new patch  . Sodium Phosphates (RA SALINE ENEMA RE) If not relieved by Biscodyl suppository, give disposable Saline Enema rectally X 1 dose/24 hrs as needed (Do not use constipation standing orders for residents with renal failure/CFR less than 30. Contact MD for orders)(Physician Or  . thiamine 100 MG tablet Take 1 tablet (100 mg total) by mouth daily.  . [DISCONTINUED] nicotine (NICODERM CQ - DOSED IN MG/24 HOURS) 21 mg/24hr patch Place 1 patch (21 mg total) onto the skin daily.   No facility-administered encounter medications on file as of 03/04/2019.    Review of Systems  GENERAL: No fever, chills or weakness MOUTH and THROAT: Denies oral discomfort, gingival pain or bleeding RESPIRATORY: no cough, SOB, DOE, wheezing, hemoptysis CARDIAC: No chest pain, edema or palpitations GI: No abdominal pain, diarrhea, constipation, heart burn, nausea or vomiting GU: Denies dysuria, frequency, hematuria, incontinence, or discharge NEUROLOGICAL: Denies dizziness, syncope, numbness, or headache PSYCHIATRIC: Denies feelings of depression or anxiety. No report of hallucinations, insomnia, paranoia, or agitation   Immunization History  Administered Date(s) Administered  . Tdap 07/11/2016, 02/22/2019   Pertinent  Health Maintenance Due  Topic Date Due  . COLONOSCOPY  03/30/2004  . INFLUENZA VACCINE  05/25/2019 (Originally 09/25/2018)   Fall Risk  08/20/2016 05/14/2016 03/21/2016 03/10/2016  Falls in the past year? No No No No     Vitals:   03/04/19 1045  BP: 114/72  Pulse: 62  Resp: 16  Temp: (!) 97 F (36.1 C)  TempSrc: Oral  SpO2: 98%  Weight: 136 lb 3.9 oz (61.8 kg)  Height: 5\' 6"  (1.676 m)   Body mass index is 21.99 kg/m.  Physical Exam  GENERAL APPEARANCE: Well nourished. In no acute distress. Normal body habitus SKIN:  Scabs on left hand and forearm. Laceration with 10 staples on left parietal scalp, dry MOUTH and THROAT: Lips are without lesions. Oral mucosa is moist  and without lesions. Tongue is normal in shape, size, and color and without lesions RESPIRATORY: Breathing is even & unlabored, BS CTAB CARDIAC: RRR, no murmur,no extra heart sounds, no edema GI: Abdomen soft, normal BS, no masses, no tenderness EXTREMITIES:  Able to move X 4 extremities NEUROLOGICAL: There is no tremor. Speech is clear. Alert and oriented X 3. PSYCHIATRIC:  Affect and behavior are appropriate  Labs reviewed: Recent Labs    02/22/19 1626 02/23/19 0832 02/26/19 0314 02/27/19 0816 03/01/19 0319 03/02/19 0400 03/03/19 0400  NA  --   --  134* 136 139 140 136  K  --   --  3.7 3.7 4.3 4.3 3.8  CL  --   --  104 106 107 107 104  CO2  --   --  20* 20* 22 23 25   GLUCOSE  --   --  95 93 106* 95 135*  BUN  --   --  7* <5* 6* 10 12  CREATININE  --   --  0.48* 0.43* 0.55* 0.68 0.64  CALCIUM  --   --  8.2* 8.6* 9.2 9.2 9.0  MG 1.8   < > 1.8 1.7 2.0 2.2 2.2  PHOS 4.3  --  2.8 3.2  --   --   --    < > = values in this interval not displayed.   Recent Labs    02/26/19 0314 02/27/19 0816 02/28/19 0638  AST 37 41 32  ALT 21 24 24   ALKPHOS 87 99 97  BILITOT 1.1 1.1 0.8  PROT 5.3* 6.0* 6.0*  ALBUMIN 2.7* 3.0* 2.9*   Recent Labs    02/22/19 0641 02/22/19 1533 02/28/19 0638 03/01/19 0319 03/02/19 0400  WBC 9.1   < > 6.3 6.5 6.7  NEUTROABS 6.6  --   --   --   --   HGB 11.9*   < > 9.6* 9.8* 9.8*  HCT 34.7*   < > 28.0* 29.1* 29.8*  MCV 100.3*   < > 99.6 100.3* 101.7*  PLT 144*   < > 275 295 343   < > = values in this interval not displayed.   No results found for: TSH Lab Results  Component Value Date   HGBA1C 5.5 03/21/2016    Significant Diagnostic Results in last 30 days:  CT ANGIO HEAD W OR WO CONTRAST  Result Date: 02/24/2019 CLINICAL DATA:  Nonreactive right pupil EXAM: CT ANGIOGRAPHY HEAD AND NECK CT PERFUSION BRAIN TECHNIQUE: Multidetector CT imaging of the head and neck was performed using the standard protocol during bolus administration of  intravenous contrast. Multiplanar CT image reconstructions and MIPs were obtained to evaluate the vascular anatomy. Carotid stenosis measurements (when applicable) are obtained utilizing NASCET criteria, using the distal internal carotid diameter as the denominator. Multiphase CT imaging of the brain was performed following IV bolus contrast injection. Subsequent parametric perfusion maps were calculated using RAPID software. CONTRAST:  147mL OMNIPAQUE IOHEXOL 350 MG/ML SOLN COMPARISON:  2018 FINDINGS: CTA NECK FINDINGS Aortic arch: Great vessel origins are patent. There is direct origin of the left vertebral artery from the arch. Right carotid system: Patent. Primarily calcified plaque at the carotid bifurcation and proximal internal carotid causing less than 50% stenosis. Left carotid system: Patent. Primarily calcified plaque at the bifurcation and proximal internal carotid causing less 50% stenosis. Vertebral arteries: Patent. Skeleton: Advanced multilevel degenerative changes of the cervical spine. Other neck: No neck mass or adenopathy. Upper chest: No apical lung mass. Review of the MIP images confirms the above findings CTA HEAD FINDINGS Anterior circulation: Intracranial internal carotid arteries are patent with mild calcified plaque. Anterior and middle cerebral arteries are patent. Posterior circulation: Intracranial vertebral arteries, basilar artery, and posterior cerebral arteries are patent. A right posterior communicating artery is identified. Venous sinuses: As permitted by contrast timing, patent. Anatomic variants: Fetal or near fetal origin of the right posterior cerebral artery. Review of the MIP images confirms the above findings CT Brain Perfusion Findings: ASPECTS: 10 CBF (<30%) Volume: 84mL Perfusion (Tmax>6.0s) volume: 59mL Mismatch Volume: 55mL Infarction Location:None IMPRESSION: No large vessel occlusion. No regional perfusion deficit or territory at risk by perfusion imaging. Plaque at the  ICA origins causing less than 50% stenosis. These results were communicated to Dr. Rory Percy at [facility] 12/31/2020by text page via the [facility] messaging system. Electronically Signed   By: Macy Mis M.D.   On: 02/24/2019 16:28   DG Knee 1-2 Views Left  Result Date: 02/27/2019 CLINICAL DATA:  Left elbow pain after fall.  EXAM: LEFT KNEE - 1-2 VIEW COMPARISON:  None. FINDINGS: No evidence of fracture, dislocation, or joint effusion. No evidence of arthropathy or other focal bone abnormality. Vascular calcifications are noted. IMPRESSION: No significant abnormality seen in the left knee. Electronically Signed   By: Marijo Conception M.D.   On: 02/27/2019 09:47   CT Head Wo Contrast  Result Date: 02/22/2019 CLINICAL DATA:  Head trauma, minor, normal mental status. Additional provided: Fall EXAM: CT HEAD WITHOUT CONTRAST CT CERVICAL SPINE WITHOUT CONTRAST TECHNIQUE: Multidetector CT imaging of the head and cervical spine was performed following the standard protocol without intravenous contrast. Multiplanar CT image reconstructions of the cervical spine were also generated. COMPARISON:  Head CT 07/16/2017, CT cervical spine 06/22/2017 qa FINDINGS: CT HEAD FINDINGS Brain: No evidence of acute intracranial hemorrhage. No demarcated cortical infarction. No midline shift or extra-axial fluid collection. Age advanced generalized parenchymal atrophy. Ill-defined hypoattenuation within the cerebral white matter is nonspecific, but consistent with chronic small vessel ischemic disease. Redemonstrated chronic left basal ganglia lacunar infarct. Unchanged 10 mm extra-axial calcified focus along the left frontal convexity, which may reflect a small incidentally calcified meningioma. Vascular: No hyperdense vessel.  Atherosclerotic calcifications. Skull: Normal. Negative for fracture or focal lesion. Sinuses/Orbits: Visualized orbits demonstrate no acute abnormality. No significant paranasal sinus disease at the imaged levels.  Small bilateral mastoid effusions. Other: Left-sided scalp hematoma/laceration. CT CERVICAL SPINE FINDINGS Alignment: Partially visualized thoracic levocurvature with cervicothoracic dextrocurvature. No significant spondylolisthesis. Skull base and vertebrae: The basion-dental and atlanto-dental intervals are maintained.No evidence of acute fracture to the cervical spine. Soft tissues and spinal canal: No prevertebral fluid or swelling. No visible canal hematoma. Carotid artery atherosclerotic disease Disc levels: Cervical spondylosis with multilevel posterior disc osteophytes, uncovertebral and facet hypertrophy. Disc height loss is severe at the C6-C7 through T1-T2 levels. Multilevel ventral osteophytes. Partial degenerative fusion of the posterior elements at C2-C3. Upper chest: No consolidation within the imaged lung apices. No visible pneumothorax. IMPRESSION: CT head: 1. No evidence of acute intracranial abnormality. Left-sided scalp hematoma/laceration. 2. Age advanced generalized parenchymal atrophy with chronic small vessel ischemic disease. 3. Unchanged 10 mm extra-axial calcified focus along the left frontal convexity, possibly reflecting a small incidental meningioma. 4. Small bilateral mastoid effusions. CT cervical spine: 1. No evidence of acute fracture to the cervical spine. 2. Cervical spondylosis as described. Electronically Signed   By: Kellie Simmering DO   On: 02/22/2019 07:57   CT ANGIO NECK W OR WO CONTRAST  Result Date: 02/24/2019 CLINICAL DATA:  Nonreactive right pupil EXAM: CT ANGIOGRAPHY HEAD AND NECK CT PERFUSION BRAIN TECHNIQUE: Multidetector CT imaging of the head and neck was performed using the standard protocol during bolus administration of intravenous contrast. Multiplanar CT image reconstructions and MIPs were obtained to evaluate the vascular anatomy. Carotid stenosis measurements (when applicable) are obtained utilizing NASCET criteria, using the distal internal carotid  diameter as the denominator. Multiphase CT imaging of the brain was performed following IV bolus contrast injection. Subsequent parametric perfusion maps were calculated using RAPID software. CONTRAST:  147mL OMNIPAQUE IOHEXOL 350 MG/ML SOLN COMPARISON:  2018 FINDINGS: CTA NECK FINDINGS Aortic arch: Great vessel origins are patent. There is direct origin of the left vertebral artery from the arch. Right carotid system: Patent. Primarily calcified plaque at the carotid bifurcation and proximal internal carotid causing less than 50% stenosis. Left carotid system: Patent. Primarily calcified plaque at the bifurcation and proximal internal carotid causing less 50% stenosis. Vertebral arteries: Patent. Skeleton: Advanced multilevel degenerative  changes of the cervical spine. Other neck: No neck mass or adenopathy. Upper chest: No apical lung mass. Review of the MIP images confirms the above findings CTA HEAD FINDINGS Anterior circulation: Intracranial internal carotid arteries are patent with mild calcified plaque. Anterior and middle cerebral arteries are patent. Posterior circulation: Intracranial vertebral arteries, basilar artery, and posterior cerebral arteries are patent. A right posterior communicating artery is identified. Venous sinuses: As permitted by contrast timing, patent. Anatomic variants: Fetal or near fetal origin of the right posterior cerebral artery. Review of the MIP images confirms the above findings CT Brain Perfusion Findings: ASPECTS: 10 CBF (<30%) Volume: 32mL Perfusion (Tmax>6.0s) volume: 41mL Mismatch Volume: 15mL Infarction Location:None IMPRESSION: No large vessel occlusion. No regional perfusion deficit or territory at risk by perfusion imaging. Plaque at the ICA origins causing less than 50% stenosis. These results were communicated to Dr. Rory Percy at [facility] 12/31/2020by text page via the [employer] messaging system. Electronically Signed   By: Macy Mis M.D.   On: 02/24/2019 16:28   CT  Cervical Spine Wo Contrast  Result Date: 02/22/2019 CLINICAL DATA:  Head trauma, minor, normal mental status. Additional provided: Fall EXAM: CT HEAD WITHOUT CONTRAST CT CERVICAL SPINE WITHOUT CONTRAST TECHNIQUE: Multidetector CT imaging of the head and cervical spine was performed following the standard protocol without intravenous contrast. Multiplanar CT image reconstructions of the cervical spine were also generated. COMPARISON:  Head CT 07/16/2017, CT cervical spine 06/22/2017 qa FINDINGS: CT HEAD FINDINGS Brain: No evidence of acute intracranial hemorrhage. No demarcated cortical infarction. No midline shift or extra-axial fluid collection. Age advanced generalized parenchymal atrophy. Ill-defined hypoattenuation within the cerebral white matter is nonspecific, but consistent with chronic small vessel ischemic disease. Redemonstrated chronic left basal ganglia lacunar infarct. Unchanged 10 mm extra-axial calcified focus along the left frontal convexity, which may reflect a small incidentally calcified meningioma. Vascular: No hyperdense vessel.  Atherosclerotic calcifications. Skull: Normal. Negative for fracture or focal lesion. Sinuses/Orbits: Visualized orbits demonstrate no acute abnormality. No significant paranasal sinus disease at the imaged levels. Small bilateral mastoid effusions. Other: Left-sided scalp hematoma/laceration. CT CERVICAL SPINE FINDINGS Alignment: Partially visualized thoracic levocurvature with cervicothoracic dextrocurvature. No significant spondylolisthesis. Skull base and vertebrae: The basion-dental and atlanto-dental intervals are maintained.No evidence of acute fracture to the cervical spine. Soft tissues and spinal canal: No prevertebral fluid or swelling. No visible canal hematoma. Carotid artery atherosclerotic disease Disc levels: Cervical spondylosis with multilevel posterior disc osteophytes, uncovertebral and facet hypertrophy. Disc height loss is severe at the C6-C7  through T1-T2 levels. Multilevel ventral osteophytes. Partial degenerative fusion of the posterior elements at C2-C3. Upper chest: No consolidation within the imaged lung apices. No visible pneumothorax. IMPRESSION: CT head: 1. No evidence of acute intracranial abnormality. Left-sided scalp hematoma/laceration. 2. Age advanced generalized parenchymal atrophy with chronic small vessel ischemic disease. 3. Unchanged 10 mm extra-axial calcified focus along the left frontal convexity, possibly reflecting a small incidental meningioma. 4. Small bilateral mastoid effusions. CT cervical spine: 1. No evidence of acute fracture to the cervical spine. 2. Cervical spondylosis as described. Electronically Signed   By: Kellie Simmering DO   On: 02/22/2019 07:57   MR BRAIN WO CONTRAST  Result Date: 02/25/2019 CLINICAL DATA:  Initial evaluation for acute encephalopathy. EXAM: MRI HEAD WITHOUT CONTRAST TECHNIQUE: Multiplanar, multiecho pulse sequences of the brain and surrounding structures were obtained without intravenous contrast. COMPARISON:  Prior CTA and CT perfusion from 02/24/2019. FINDINGS: Brain: Diffuse prominence of the CSF containing spaces compatible with generalized  age-related cerebral atrophy. Mild chronic microvascular ischemic change present within the periventricular white matter. No abnormal foci of restricted diffusion to suggest acute or subacute ischemia. Gray-white matter differentiation maintained. No encephalomalacia to suggest chronic cortical infarction. No foci of susceptibility artifact to suggest acute or chronic intracranial hemorrhage. No mass lesion, midline shift or mass effect. Mild diffuse ventricular prominence related to global parenchymal volume loss of hydrocephalus. No extra-axial fluid collection. Pituitary gland suprasellar region normal. Midline structures intact. Vascular: Major intracranial vascular flow voids are maintained. Skull and upper cervical spine: Craniocervical junction  within normal limits. Degenerative spondylosis noted at C3-4 with resultant mild spinal stenosis. Bone marrow signal intensity within normal limits. Evolving left parietal scalp hematoma with skin staples in place. Sinuses/Orbits: Globes and orbital soft tissues demonstrate no acute finding. Paranasal sinuses are largely clear. Right greater than left mastoid effusions noted. Visualized nasopharynx within normal limits. Other: None. IMPRESSION: 1. No acute intracranial abnormality. 2. Mildly advanced age-related cerebral atrophy with chronic small vessel ischemic disease. 3. Evolving left parietal scalp contusion with skin staples in place. 4. Bilateral mastoid effusions, right greater than left. Electronically Signed   By: Jeannine Boga M.D.   On: 02/25/2019 01:17   CT CEREBRAL PERFUSION W CONTRAST  Result Date: 02/24/2019 CLINICAL DATA:  Nonreactive right pupil EXAM: CT ANGIOGRAPHY HEAD AND NECK CT PERFUSION BRAIN TECHNIQUE: Multidetector CT imaging of the head and neck was performed using the standard protocol during bolus administration of intravenous contrast. Multiplanar CT image reconstructions and MIPs were obtained to evaluate the vascular anatomy. Carotid stenosis measurements (when applicable) are obtained utilizing NASCET criteria, using the distal internal carotid diameter as the denominator. Multiphase CT imaging of the brain was performed following IV bolus contrast injection. Subsequent parametric perfusion maps were calculated using RAPID software. CONTRAST:  172mL OMNIPAQUE IOHEXOL 350 MG/ML SOLN COMPARISON:  2018 FINDINGS: CTA NECK FINDINGS Aortic arch: Great vessel origins are patent. There is direct origin of the left vertebral artery from the arch. Right carotid system: Patent. Primarily calcified plaque at the carotid bifurcation and proximal internal carotid causing less than 50% stenosis. Left carotid system: Patent. Primarily calcified plaque at the bifurcation and proximal  internal carotid causing less 50% stenosis. Vertebral arteries: Patent. Skeleton: Advanced multilevel degenerative changes of the cervical spine. Other neck: No neck mass or adenopathy. Upper chest: No apical lung mass. Review of the MIP images confirms the above findings CTA HEAD FINDINGS Anterior circulation: Intracranial internal carotid arteries are patent with mild calcified plaque. Anterior and middle cerebral arteries are patent. Posterior circulation: Intracranial vertebral arteries, basilar artery, and posterior cerebral arteries are patent. A right posterior communicating artery is identified. Venous sinuses: As permitted by contrast timing, patent. Anatomic variants: Fetal or near fetal origin of the right posterior cerebral artery. Review of the MIP images confirms the above findings CT Brain Perfusion Findings: ASPECTS: 10 CBF (<30%) Volume: 58mL Perfusion (Tmax>6.0s) volume: 47mL Mismatch Volume: 11mL Infarction Location:None IMPRESSION: No large vessel occlusion. No regional perfusion deficit or territory at risk by perfusion imaging. Plaque at the ICA origins causing less than 50% stenosis. These results were communicated to Dr. Rory Percy at [facility] 12/31/2020by text page via the [facility] messaging system. Electronically Signed   By: Macy Mis M.D.   On: 02/24/2019 16:28   EEG adult  Result Date: 02/25/2019 Lora Havens, MD     02/25/2019 12:28 PM Patient Name: Edwin Torres MRN: [id number] Epilepsy Attending: Lora Havens Referring Physician/Provider: Dr. Amie [place]  Date: 02/25/2019 Duration: 23.45 minutes Patient history: 64 year old male with history of alcohol use and alcohol-related seizures asymmetry of Keppra and altered mental status.  EEG to evaluate for seizures. Level of alertness: awake, asleep AEDs during EEG study: None Technical aspects: This EEG study was done with scalp electrodes positioned according to the 10-20 International system of electrode placement. Electrical  activity was acquired at a sampling rate of 500Hz  and reviewed with a high frequency filter of 70Hz  and a low frequency filter of 1Hz . EEG data were recorded continuously and digitally stored. DESCRIPTION:  The posterior dominant rhythm consists of 8-9 Hz activity of moderate voltage (25-35 uV) seen predominantly in posterior head regions, symmetric and reactive to eye opening and eye closing.    Sleep was characterized vertex waves. Hyperventilation and photic stimulation were not performed. IMPRESSION: This study is within normal limits.  No seizures or epileptiform discharges were seen throughout the recording. Lora Havens   CT HEAD CODE STROKE WO CONTRAST  Result Date: 02/24/2019 CLINICAL DATA:  Code stroke.  Right pupil nonreactive EXAM: CT HEAD WITHOUT CONTRAST TECHNIQUE: Contiguous axial images were obtained from the base of the skull through the vertex without intravenous contrast. COMPARISON:  02/22/2019 FINDINGS: Brain: There is no acute intracranial hemorrhage, mass effect, or edema. Gray-white differentiation is preserved. Prominence of the ventricles and sulci reflects stable generalized parenchymal volume loss. Patchy hypoattenuation in the supratentorial white matter is nonspecific but likely reflects stable chronic microvascular ischemic changes. Vascular: No hyperdense vessel. There is intracranial atherosclerotic calcification at the skull base. Skull: Unremarkable. Sinuses/Orbits: Trace mucosal thickening. Other: Minor patchy mastoid opacification. Similar left scalp hematoma. ASPECTS [facility] Stroke Program Early CT Score) - Ganglionic level infarction (caudate, lentiform nuclei, internal capsule, insula, M1-M3 cortex): 7 - Supraganglionic infarction (M4-M6 cortex): 3 Total score (0-10 with 10 being normal): 10 IMPRESSION: 1. No acute intracranial hemorrhage or evidence of acute infarction. Stable chronic findings detailed above. 2. ASPECTS is 10 These results were communicated to Dr.  Rory Percy at 4:04 pmon 12/31/2020by text page via the [facility] messaging system. Electronically Signed   By: Macy Mis M.D.   On: 02/24/2019 16:05    Assessment/Plan  1. Alcohol abuse with intoxication ([place]) -Received Librium as needed.  He is unable to tolerate Ativan since, as per wife, it caused agitation and somnolence. -Continue multivitamins with minerals, thiamine and folic acid  2. Frequent falls -Here for short-term rehabilitation, PT and OT for therapeutic strengthening exercises, fall precautions  3. Hypokalemia Lab Results  Component Value Date   K 3.8 03/03/2019  Supplemented, will monitor  4. Laceration of scalp, sequela -Has staples intact, dry, no redness, monitor for infection  5. Tobacco abuse -Continue nicotine patch  6. Macrocytic anemia Lab Results  Component Value Date   WBC 6.7 03/02/2019   HGB 9.8 (L) 03/02/2019   HCT 29.8 (L) 03/02/2019   MCV 101.7 (H) 03/02/2019   PLT 343 03/02/2019  - will monitor  7. Dysphagia, unspecified type - for ST treatments, aspiration precautions  8. Moderate Neurocognitive deficits - 11/15 BIMS, for ST treatments     Family/ staff Communication: Discussed plan of care with resident and charge nurse.  Labs/tests ordered: None  Goals of care:   Short-term care   Durenda Age, DNP, FNP-BC [facility] [phone] (Monday-Friday 8:00 a.m. - 5:00 p.m.) [phone] (after hours)

## 2019-03-07 LAB — COMPREHENSIVE METABOLIC PANEL
Calcium: 9.1 (ref 8.7–10.7)
GFR calc Af Amer: 90
GFR calc non Af Amer: 90

## 2019-03-07 LAB — CBC: RBC: 3.06 — AB (ref 3.87–5.11)

## 2019-03-07 LAB — BASIC METABOLIC PANEL
BUN: 11 (ref 4–21)
CO2: 23 — AB (ref 13–22)
Chloride: 108 (ref 99–108)
Creatinine: 0.7 (ref 0.6–1.3)
Glucose: 120
Potassium: 4.3 (ref 3.4–5.3)
Sodium: 143 (ref 137–147)

## 2019-03-07 LAB — CBC AND DIFFERENTIAL
HCT: 30 — AB (ref 41–53)
Hemoglobin: 10.1 — AB (ref 13.5–17.5)
Neutrophils Absolute: 5
Platelets: 531 — AB (ref 150–399)
WBC: 8.8

## 2019-03-08 ENCOUNTER — Encounter: Payer: Self-pay | Admitting: Internal Medicine

## 2019-03-08 ENCOUNTER — Non-Acute Institutional Stay (SKILLED_NURSING_FACILITY): Payer: PPO | Admitting: Internal Medicine

## 2019-03-08 DIAGNOSIS — R29818 Other symptoms and signs involving the nervous system: Secondary | ICD-10-CM | POA: Diagnosis not present

## 2019-03-08 DIAGNOSIS — F10129 Alcohol abuse with intoxication, unspecified: Secondary | ICD-10-CM

## 2019-03-08 DIAGNOSIS — R4189 Other symptoms and signs involving cognitive functions and awareness: Secondary | ICD-10-CM | POA: Diagnosis not present

## 2019-03-08 DIAGNOSIS — D539 Nutritional anemia, unspecified: Secondary | ICD-10-CM

## 2019-03-08 DIAGNOSIS — D696 Thrombocytopenia, unspecified: Secondary | ICD-10-CM

## 2019-03-08 DIAGNOSIS — R296 Repeated falls: Secondary | ICD-10-CM

## 2019-03-08 DIAGNOSIS — R1310 Dysphagia, unspecified: Secondary | ICD-10-CM | POA: Diagnosis not present

## 2019-03-08 NOTE — Patient Instructions (Signed)
See assessment and plan under each diagnosis in the problem list and acutely for this visit 

## 2019-03-08 NOTE — Progress Notes (Signed)
NURSING HOME LOCATION:  [facility] ROOM NUMBER:  [id number]  CODE STATUS:  FULL CODE  PCP:  Patient, No Pcp Per   This is a comprehensive admission note to [place] performed on this date less than 30 days from date of admission. Included are preadmission medical/surgical history; reconciled medication list; family history; social history and comprehensive review of systems.  Corrections and additions to the records were documented. Comprehensive physical exam was also performed. Additionally a clinical summary was entered for each active diagnosis pertinent to this admission in the Problem List to enhance continuity of care.  HPI: Patient was hospitalized 02/22/2019-03/03/2019 following a laceration of the left side of his head sustained in a fall.  Frequent falls were reported in the context of debilitation and deconditioning as well as alcohol abuse.He was actively drinking when the fall occurred.  Initially the patient refused to go to the ED; his wife called EMS.  Supportive care was recommended for the left parietal scalp hematoma/laceration. "Pupil asymmetry" (probably ptosis) was noted and code stroke was called; head CT was unremarkable.  MRI revealed no evidence of stroke.  CT angiogram of the head and neck did not show any evidence of large vessel occlusion.  EEG revealed no evidence of seizures.  Speech therapy recommended dysphagia 3 diet.  Neurology recommended as needed Librium for alcohol abuse with withdrawal.  Ativan was not initiated as he apparently has a history of intolerance with agitation and somnolence.   Hypokalemia was corrected and thrombocytopenia resolved. Macrocytic anemia was stable.  Thiamine, multivitamins, and folic acid were continued.  Past medical and surgical history: Includes history of seizure disorder and alcoholism. Surgeries and procedures include vasectomy.  Social history: Alcohol abuse history as noted.  Active smoker at admission.   Presently on NicoDerm patch.  Family history: Reviewed   Review of systems: He admits to depression and wants to go home.  He made the statement that he can do PT/OT at home.  He indicates that he will resume smoking and drinking as prior to admission. He states he is on disability having fractured both heels and a fall from the roof.  He states that he bruises easily but has no other bleeding dyscrasias.  He states that he has had intermittent ptosis of the right eye for 5-6 years which was extensively evaluated.  Constitutional: No fever, significant weight change, fatigue  Eyes: No redness, discharge, pain, vision change ENT/mouth: No nasal congestion, purulent discharge, earache, change in hearing, sore throat  Cardiovascular: No chest pain, palpitations, paroxysmal nocturnal dyspnea, claudication, edema  Respiratory: No cough, sputum production, hemoptysis, DOE, significant snoring, apnea Gastrointestinal: No heartburn, dysphagia, abdominal pain, nausea /vomiting, rectal bleeding, melena, change in bowels Genitourinary: No dysuria, hematuria, pyuria, incontinence, nocturia Musculoskeletal: No joint stiffness, joint swelling, weakness Dermatologic: No rash, pruritus, change in appearance of skin Neurologic: No dizziness, headache, syncope, seizures, numbness, tingling Psychiatric: No insomnia, anorexia Endocrine: No change in hair/skin/nails, excessive thirst, excessive hunger, excessive urination  Hematologic/lymphatic: No significant lymphadenopathy, abnormal bleeding Allergy/immunology: No itchy/watery eyes, significant sneezing, urticaria, angioedema  Physical exam:  Pertinent or positive findings: He appears suboptimally nourished.  He has marked ptosis of the right eye.  He has intermittent exotropia of the left eye.  Eyebrows are markedly thin, especially on the right.  Dental hygiene is extremely poor with dense plaque. He has low-grade rhonchi.  Pedal pulses are decreased but  palpable. Clubbing present. Scattered hyperpigmentation ,scarring & vitiligo over forearms.  General appearance:  no  acute distress, increased work of breathing is present.   Lymphatic: No lymphadenopathy about the head, neck, axilla. Eyes: No conjunctival inflammation or lid edema is present. There is no scleral icterus. Ears:  External ear exam shows no significant lesions or deformities.   Nose:  External nasal examination shows no deformity or inflammation. Nasal mucosa are pink and moist without lesions, exudates Oral exam: Lips and gums are healthy appearing.There is no oropharyngeal erythema or exudate. Neck:  No thyromegaly, masses, tenderness noted.    Heart:  No gallop, murmur, click, rub.  Lungs: without wheezes,rales, rubs. Abdomen: Bowel sounds are normal.  Abdomen is soft and nontender with no organomegaly, hernias, masses. GU: Deferred  Extremities:  No cyanosis, edema. Neurologic exam: Balance, Rhomberg, finger to nose testing could not be completed due to clinical state Skin: Warm & dry w/o tenting. No significant rash.  See clinical summary under each active problem in the Problem List with associated updated therapeutic plan

## 2019-03-08 NOTE — Assessment & Plan Note (Signed)
PT/OT at SNF °

## 2019-03-08 NOTE — Assessment & Plan Note (Signed)
Resolved.  No evidence of active bleeding dyscrasia.

## 2019-03-08 NOTE — Assessment & Plan Note (Signed)
Dysphagia 3 diet.  Speech therapy monitor at SNF.

## 2019-03-09 NOTE — Assessment & Plan Note (Signed)
B12 level indicated

## 2019-03-09 NOTE — Assessment & Plan Note (Signed)
Lack of insight suggests his behavior will not change & prognosis is poor.

## 2019-03-09 NOTE — Assessment & Plan Note (Addendum)
See "Neurocognitive" concerns

## 2019-03-10 ENCOUNTER — Non-Acute Institutional Stay (SKILLED_NURSING_FACILITY): Payer: PPO | Admitting: Adult Health

## 2019-03-10 ENCOUNTER — Encounter: Payer: Self-pay | Admitting: Adult Health

## 2019-03-10 DIAGNOSIS — R296 Repeated falls: Secondary | ICD-10-CM

## 2019-03-10 DIAGNOSIS — Z720 Tobacco use: Secondary | ICD-10-CM | POA: Diagnosis not present

## 2019-03-10 DIAGNOSIS — F10129 Alcohol abuse with intoxication, unspecified: Secondary | ICD-10-CM | POA: Diagnosis not present

## 2019-03-10 DIAGNOSIS — S0101XS Laceration without foreign body of scalp, sequela: Secondary | ICD-10-CM

## 2019-03-10 MED ORDER — THIAMINE HCL 100 MG PO TABS: 100.0000 mg | ORAL_TABLET | Freq: Every day | ORAL | 0 refills | Status: DC

## 2019-03-10 MED ORDER — MAGNESIUM OXIDE 400 (241.3 MG) MG PO TABS: 400.0000 mg | ORAL_TABLET | Freq: Two times a day (BID) | ORAL | 0 refills | Status: DC

## 2019-03-10 MED ORDER — NICOTINE 14 MG/24HR TD PT24: 14.0000 mg | MEDICATED_PATCH | Freq: Every day | TRANSDERMAL | 0 refills | Status: DC

## 2019-03-10 MED ORDER — FOLIC ACID 1 MG PO TABS: 1.0000 mg | ORAL_TABLET | Freq: Every day | ORAL | 0 refills | Status: DC

## 2019-03-10 MED ORDER — ADULT MULTIVITAMIN W/MINERALS CH: 1.0000 | ORAL_TABLET | Freq: Every day | ORAL | 0 refills | Status: DC

## 2019-03-10 NOTE — Progress Notes (Signed)
Location:  [facility] Room Number: 303/A Place of Service:  SNF (31) Provider:  Durenda Age, DNP, FNP-BC  Patient Care Team: Patient, No Pcp Per as PCP - General (General Practice)  Extended Emergency Contact Information Primary Emergency Contact: Pineda,Michelle Address: [address]          [place], [place] [address] [place] Phone: [phone] Relation: Spouse  Code Status:  Full Code  Goals of care: Advanced Directive information Advanced Directives 03/10/2019  Does Patient Have a Medical Advance Directive? Yes  Type of Advance Directive (No Data)  Does patient want to make changes to medical advance directive? No - Patient declined  Would patient like information on creating a medical advance directive? -     Chief Complaint  Patient presents with  . Discharge Note    Discharge Visit    HPI:  Pt is a 64 y.o. male who is for discharge home with Home health PT and OT.  He was admitted to [facility] on 03/03/19 from hospitalization 02/22/19 to 03/03/19 for frequent falls. He had fallen for 3 hours before he was brought in to the hospital. He sustained a large laceration on his left parietal scalp. He initially refused to go to the hospital so his wife called the emergency services so he was brought to ED via EMS and [facility].  Head CT was unremarkable.  MRI of the brain did not show any evidence of stroke.  CT angiogram of the neck/head did not show any evidence of large vessel occlusion.  EEG showed no evidence of seizures.  Neurology was consulted.  X-ray of the left knee did not show any acute abnormality.  He drinks 4-5 beers per day for many years and smokes 1 pack of cigarettes per day.  He denies having depression but wife reported that he has been depressed and has poor appetite.  He has not seen his PCP in years.  Patient was admitted to this facility for short-term rehabilitation after the  patient's recent hospitalization.  Patient has completed SNF rehabilitation and therapy has cleared the patient for discharge.   Past Medical History:  Diagnosis Date  . 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 Seizures ([facility])    Past Surgical History:  Procedure Laterality Date  . VASECTOMY      Allergies  Allergen Reactions  . Penicillins Anaphylaxis    Has patient had a PCN reaction causing immediate rash, facial/tongue/throat swelling, SOB or lightheadedness with hypotension: Yes Has patient had a PCN reaction causing severe rash involving mucus membranes or skin necrosis: No Has patient had a PCN reaction that required hospitalization: Yes Has patient had a PCN reaction occurring within the last 10 years: No If all of the above answers are "NO", then may proceed with Cephalosporin use.   . Ativan [Lorazepam] Other (See Comments)    Made him crazy    Outpatient Encounter Medications as of 03/10/2019  Medication Sig  . bisacodyl (DULCOLAX) 10 MG suppository If not relieved by MOM, give 10 mg Bisacodyl suppositiory rectally X 1 dose in 24 hours as needed (Do not use constipation standing orders for residents with renal failure/CFR less than 30. Contact MD for orders) (Physician Order)  . feeding supplement, ENSURE ENLIVE, (ENSURE ENLIVE) LIQD Take 237 mLs by mouth 2 (two) times daily between meals.  . folic acid (FOLVITE) 1 MG tablet Take 1 tablet (1 mg total) by mouth daily.  . magnesium hydroxide (MILK OF MAGNESIA)  400 MG/5ML suspension If no BM in 3 days, give 30 cc Milk of Magnesium p.o. x 1 dose in 24 hours as needed (Do not use standing constipation orders for residents with renal failure CFR less than 30. Contact MD for orders) (Physician Order)  . magnesium oxide (MAG-OX) 400 (241.3 Mg) MG tablet Take 1 tablet (400 mg total) by mouth 2 (two) times daily.  . Multiple Vitamin (MULTIVITAMIN WITH MINERALS) TABS tablet Take 1 tablet by mouth daily.  . nicotine (NICODERM CQ - DOSED IN MG/24 HOURS) 14  mg/24hr patch Place 14 mg onto the skin daily. Remove old patch prior to placing the new patch  . Sodium Phosphates (RA SALINE ENEMA RE) If not relieved by Biscodyl suppository, give disposable Saline Enema rectally X 1 dose/24 hrs as needed (Do not use constipation standing orders for residents with renal failure/CFR less than 30. Contact MD for orders)(Physician Or  . thiamine 100 MG tablet Take 1 tablet (100 mg total) by mouth daily.   No facility-administered encounter medications on file as of 03/10/2019.    Review of Systems  GENERAL: No change in appetite, no fatigue, no weight changes, no fever, chills or weakness MOUTH and THROAT: Denies oral discomfort, gingival pain or bleeding, pain from teeth or hoarseness   RESPIRATORY: no cough, SOB, DOE, wheezing, hemoptysis CARDIAC: No chest pain, edema or palpitations GI: No abdominal pain, diarrhea, constipation, heart burn, nausea or vomiting GU: Denies dysuria, frequency, hematuria, incontinence, or discharge NEUROLOGICAL: Denies dizziness, syncope, numbness, or headache PSYCHIATRIC: Denies feelings of depression or anxiety. No report of hallucinations, insomnia, paranoia, or agitation   Immunization History  Administered Date(s) Administered  . Tdap 07/11/2016, 02/22/2019   Pertinent  Health Maintenance Due  Topic Date Due  . COLONOSCOPY  03/30/2004  . INFLUENZA VACCINE  05/25/2019 (Originally 09/25/2018)   Fall Risk  08/20/2016 05/14/2016 03/21/2016 03/10/2016  Falls in the past year? No No No No     Vitals:   03/10/19 1224  BP: 110/62  Pulse: 66  Resp: 18  Temp: (!) 97.5 F (36.4 C)  TempSrc: Oral  Weight: 116 lb 9.6 oz (52.9 kg)  Height: 5\' 6"  (1.676 m)   Body mass index is 18.82 kg/m.  Physical Exam  GENERAL APPEARANCE:  In no acute distress.  SKIN:  Healed laceration on left parietal scalp EYES: Right eye close but opens when asked  MOUTH and THROAT: Lips are without lesions. Oral mucosa is moist and without  lesions. Tongue is normal in shape, size, and color and without lesions RESPIRATORY: Breathing is even & unlabored, BS CTAB CARDIAC: RRR, no murmur,no extra heart sounds, no edema GI: Abdomen soft, normal BS, no masses, no tenderness EXTREMITIES:  Able to move X 4 extremities NEUROLOGICAL: + tremor. Speech is clear. PSYCHIATRIC:  Affect and behavior are appropriate   Labs reviewed: Recent Labs    02/22/19 1626 02/23/19 0832 02/26/19 0314 02/26/19 0314 02/27/19 0816 02/28/19 [id number] 03/01/19 0319 03/01/19 0319 03/02/19 0400 03/03/19 0400 03/07/19 0000  NA  --    < > 134*   < > 136   < > 139   < > 140 136 143  K  --    < > 3.7   < > 3.7   < > 4.3   < > 4.3 3.8 4.3  CL  --    < > 104   < > 106   < > 107   < > 107 104 108  CO2  --    < >  20*   < > 20*   < > 22   < > 23 25 23*  GLUCOSE  --    < > 95   < > 93   < > 106*  --  95 135*  --   BUN  --    < > 7*   < > <5*   < > 6*   < > 10 12 11   CREATININE  --    < > 0.48*   < > 0.43*   < > 0.55*   < > 0.68 0.64 0.7  CALCIUM  --    < > 8.2*   < > 8.6*   < > 9.2   < > 9.2 9.0 9.1  MG 1.8   < > 1.8   < > 1.7   < > 2.0  --  2.2 2.2  --   PHOS 4.3  --  2.8  --  3.2  --   --   --   --   --   --    < > = values in this interval not displayed.   Recent Labs    02/26/19 0314 02/27/19 0816 02/28/19 0638  AST 37 41 32  ALT 21 24 24   ALKPHOS 87 99 97  BILITOT 1.1 1.1 0.8  PROT 5.3* 6.0* 6.0*  ALBUMIN 2.7* 3.0* 2.9*   Recent Labs    02/22/19 0641 02/22/19 1533 02/28/19 0638 02/28/19 0638 03/01/19 0319 03/02/19 0400 03/07/19 0000  WBC 9.1   < > 6.3   < > 6.5 6.7 8.8  NEUTROABS 6.6  --   --   --   --   --  5  HGB 11.9*   < > 9.6*   < > 9.8* 9.8* 10.1*  HCT 34.7*   < > 28.0*   < > 29.1* 29.8* 30*  MCV 100.3*   < > 99.6  --  100.3* 101.7*  --   PLT 144*   < > 275   < > 295 343 531*   < > = values in this interval not displayed.   No results found for: TSH Lab Results  Component Value Date   HGBA1C 5.5 03/21/2016    Significant  Diagnostic Results in last 30 days:  CT ANGIO HEAD W OR WO CONTRAST  Result Date: 02/24/2019 CLINICAL DATA:  Nonreactive right pupil EXAM: CT ANGIOGRAPHY HEAD AND NECK CT PERFUSION BRAIN TECHNIQUE: Multidetector CT imaging of the head and neck was performed using the standard protocol during bolus administration of intravenous contrast. Multiplanar CT image reconstructions and MIPs were obtained to evaluate the vascular anatomy. Carotid stenosis measurements (when applicable) are obtained utilizing NASCET criteria, using the distal internal carotid diameter as the denominator. Multiphase CT imaging of the brain was performed following IV bolus contrast injection. Subsequent parametric perfusion maps were calculated using RAPID software. CONTRAST:  163mL OMNIPAQUE IOHEXOL 350 MG/ML SOLN COMPARISON:  2018 FINDINGS: CTA NECK FINDINGS Aortic arch: Great vessel origins are patent. There is direct origin of the left vertebral artery from the arch. Right carotid system: Patent. Primarily calcified plaque at the carotid bifurcation and proximal internal carotid causing less than 50% stenosis. Left carotid system: Patent. Primarily calcified plaque at the bifurcation and proximal internal carotid causing less 50% stenosis. Vertebral arteries: Patent. Skeleton: Advanced multilevel degenerative changes of the cervical spine. Other neck: No neck mass or adenopathy. Upper chest: No apical lung mass. Review of the MIP images confirms the above findings  CTA HEAD FINDINGS Anterior circulation: Intracranial internal carotid arteries are patent with mild calcified plaque. Anterior and middle cerebral arteries are patent. Posterior circulation: Intracranial vertebral arteries, basilar artery, and posterior cerebral arteries are patent. A right posterior communicating artery is identified. Venous sinuses: As permitted by contrast timing, patent. Anatomic variants: Fetal or near fetal origin of the right posterior cerebral artery.  Review of the MIP images confirms the above findings CT Brain Perfusion Findings: ASPECTS: 10 CBF (<30%) Volume: 75mL Perfusion (Tmax>6.0s) volume: 69mL Mismatch Volume: 57mL Infarction Location:None IMPRESSION: No large vessel occlusion. No regional perfusion deficit or territory at risk by perfusion imaging. Plaque at the ICA origins causing less than 50% stenosis. These results were communicated to Dr. Rory Percy at [place] 12/31/2020by text page via the [facility] messaging system. Electronically Signed   By: Macy Mis M.D.   On: 02/24/2019 16:28   DG Knee 1-2 Views Left  Result Date: 02/27/2019 CLINICAL DATA:  Left elbow pain after fall. EXAM: LEFT KNEE - 1-2 VIEW COMPARISON:  None. FINDINGS: No evidence of fracture, dislocation, or joint effusion. No evidence of arthropathy or other focal bone abnormality. Vascular calcifications are noted. IMPRESSION: No significant abnormality seen in the left knee. Electronically Signed   By: Marijo Conception M.D.   On: 02/27/2019 09:47   CT Head Wo Contrast  Result Date: 02/22/2019 CLINICAL DATA:  Head trauma, minor, normal mental status. Additional provided: Fall EXAM: CT HEAD WITHOUT CONTRAST CT CERVICAL SPINE WITHOUT CONTRAST TECHNIQUE: Multidetector CT imaging of the head and cervical spine was performed following the standard protocol without intravenous contrast. Multiplanar CT image reconstructions of the cervical spine were also generated. COMPARISON:  Head CT 07/16/2017, CT cervical spine 06/22/2017 qa FINDINGS: CT HEAD FINDINGS Brain: No evidence of acute intracranial hemorrhage. No demarcated cortical infarction. No midline shift or extra-axial fluid collection. Age advanced generalized parenchymal atrophy. Ill-defined hypoattenuation within the cerebral white matter is nonspecific, but consistent with chronic small vessel ischemic disease. Redemonstrated chronic left basal ganglia lacunar infarct. Unchanged 10 mm extra-axial calcified focus along the left  frontal convexity, which may reflect a small incidentally calcified meningioma. Vascular: No hyperdense vessel.  Atherosclerotic calcifications. Skull: Normal. Negative for fracture or focal lesion. Sinuses/Orbits: Visualized orbits demonstrate no acute abnormality. No significant paranasal sinus disease at the imaged levels. Small bilateral mastoid effusions. Other: Left-sided scalp hematoma/laceration. CT CERVICAL SPINE FINDINGS Alignment: Partially visualized thoracic levocurvature with cervicothoracic dextrocurvature. No significant spondylolisthesis. Skull base and vertebrae: The basion-dental and atlanto-dental intervals are maintained.No evidence of acute fracture to the cervical spine. Soft tissues and spinal canal: No prevertebral fluid or swelling. No visible canal hematoma. Carotid artery atherosclerotic disease Disc levels: Cervical spondylosis with multilevel posterior disc osteophytes, uncovertebral and facet hypertrophy. Disc height loss is severe at the C6-C7 through T1-T2 levels. Multilevel ventral osteophytes. Partial degenerative fusion of the posterior elements at C2-C3. Upper chest: No consolidation within the imaged lung apices. No visible pneumothorax. IMPRESSION: CT head: 1. No evidence of acute intracranial abnormality. Left-sided scalp hematoma/laceration. 2. Age advanced generalized parenchymal atrophy with chronic small vessel ischemic disease. 3. Unchanged 10 mm extra-axial calcified focus along the left frontal convexity, possibly reflecting a small incidental meningioma. 4. Small bilateral mastoid effusions. CT cervical spine: 1. No evidence of acute fracture to the cervical spine. 2. Cervical spondylosis as described. Electronically Signed   By: Kellie Simmering DO   On: 02/22/2019 07:57   CT ANGIO NECK W OR WO CONTRAST  Result Date: 02/24/2019 CLINICAL  DATA:  Nonreactive right pupil EXAM: CT ANGIOGRAPHY HEAD AND NECK CT PERFUSION BRAIN TECHNIQUE: Multidetector CT imaging of the head  and neck was performed using the standard protocol during bolus administration of intravenous contrast. Multiplanar CT image reconstructions and MIPs were obtained to evaluate the vascular anatomy. Carotid stenosis measurements (when applicable) are obtained utilizing NASCET criteria, using the distal internal carotid diameter as the denominator. Multiphase CT imaging of the brain was performed following IV bolus contrast injection. Subsequent parametric perfusion maps were calculated using RAPID software. CONTRAST:  177mL OMNIPAQUE IOHEXOL 350 MG/ML SOLN COMPARISON:  2018 FINDINGS: CTA NECK FINDINGS Aortic arch: Great vessel origins are patent. There is direct origin of the left vertebral artery from the arch. Right carotid system: Patent. Primarily calcified plaque at the carotid bifurcation and proximal internal carotid causing less than 50% stenosis. Left carotid system: Patent. Primarily calcified plaque at the bifurcation and proximal internal carotid causing less 50% stenosis. Vertebral arteries: Patent. Skeleton: Advanced multilevel degenerative changes of the cervical spine. Other neck: No neck mass or adenopathy. Upper chest: No apical lung mass. Review of the MIP images confirms the above findings CTA HEAD FINDINGS Anterior circulation: Intracranial internal carotid arteries are patent with mild calcified plaque. Anterior and middle cerebral arteries are patent. Posterior circulation: Intracranial vertebral arteries, basilar artery, and posterior cerebral arteries are patent. A right posterior communicating artery is identified. Venous sinuses: As permitted by contrast timing, patent. Anatomic variants: Fetal or near fetal origin of the right posterior cerebral artery. Review of the MIP images confirms the above findings CT Brain Perfusion Findings: ASPECTS: 10 CBF (<30%) Volume: 60mL Perfusion (Tmax>6.0s) volume: 73mL Mismatch Volume: 72mL Infarction Location:None IMPRESSION: No large vessel occlusion. No  regional perfusion deficit or territory at risk by perfusion imaging. Plaque at the ICA origins causing less than 50% stenosis. These results were communicated to Dr. Rory Percy at [place] 12/31/2020by text page via the [facility] messaging system. Electronically Signed   By: Macy Mis M.D.   On: 02/24/2019 16:28   CT Cervical Spine Wo Contrast  Result Date: 02/22/2019 CLINICAL DATA:  Head trauma, minor, normal mental status. Additional provided: Fall EXAM: CT HEAD WITHOUT CONTRAST CT CERVICAL SPINE WITHOUT CONTRAST TECHNIQUE: Multidetector CT imaging of the head and cervical spine was performed following the standard protocol without intravenous contrast. Multiplanar CT image reconstructions of the cervical spine were also generated. COMPARISON:  Head CT 07/16/2017, CT cervical spine 06/22/2017 qa FINDINGS: CT HEAD FINDINGS Brain: No evidence of acute intracranial hemorrhage. No demarcated cortical infarction. No midline shift or extra-axial fluid collection. Age advanced generalized parenchymal atrophy. Ill-defined hypoattenuation within the cerebral white matter is nonspecific, but consistent with chronic small vessel ischemic disease. Redemonstrated chronic left basal ganglia lacunar infarct. Unchanged 10 mm extra-axial calcified focus along the left frontal convexity, which may reflect a small incidentally calcified meningioma. Vascular: No hyperdense vessel.  Atherosclerotic calcifications. Skull: Normal. Negative for fracture or focal lesion. Sinuses/Orbits: Visualized orbits demonstrate no acute abnormality. No significant paranasal sinus disease at the imaged levels. Small bilateral mastoid effusions. Other: Left-sided scalp hematoma/laceration. CT CERVICAL SPINE FINDINGS Alignment: Partially visualized thoracic levocurvature with cervicothoracic dextrocurvature. No significant spondylolisthesis. Skull base and vertebrae: The basion-dental and atlanto-dental intervals are maintained.No evidence of acute  fracture to the cervical spine. Soft tissues and spinal canal: No prevertebral fluid or swelling. No visible canal hematoma. Carotid artery atherosclerotic disease Disc levels: Cervical spondylosis with multilevel posterior disc osteophytes, uncovertebral and facet hypertrophy. Disc height loss is severe at  the C6-C7 through T1-T2 levels. Multilevel ventral osteophytes. Partial degenerative fusion of the posterior elements at C2-C3. Upper chest: No consolidation within the imaged lung apices. No visible pneumothorax. IMPRESSION: CT head: 1. No evidence of acute intracranial abnormality. Left-sided scalp hematoma/laceration. 2. Age advanced generalized parenchymal atrophy with chronic small vessel ischemic disease. 3. Unchanged 10 mm extra-axial calcified focus along the left frontal convexity, possibly reflecting a small incidental meningioma. 4. Small bilateral mastoid effusions. CT cervical spine: 1. No evidence of acute fracture to the cervical spine. 2. Cervical spondylosis as described. Electronically Signed   By: Kellie Simmering DO   On: 02/22/2019 07:57   MR BRAIN WO CONTRAST  Result Date: 02/25/2019 CLINICAL DATA:  Initial evaluation for acute encephalopathy. EXAM: MRI HEAD WITHOUT CONTRAST TECHNIQUE: Multiplanar, multiecho pulse sequences of the brain and surrounding structures were obtained without intravenous contrast. COMPARISON:  Prior CTA and CT perfusion from 02/24/2019. FINDINGS: Brain: Diffuse prominence of the CSF containing spaces compatible with generalized age-related cerebral atrophy. Mild chronic microvascular ischemic change present within the periventricular white matter. No abnormal foci of restricted diffusion to suggest acute or subacute ischemia. Gray-white matter differentiation maintained. No encephalomalacia to suggest chronic cortical infarction. No foci of susceptibility artifact to suggest acute or chronic intracranial hemorrhage. No mass lesion, midline shift or mass effect. Mild  diffuse ventricular prominence related to global parenchymal volume loss of hydrocephalus. No extra-axial fluid collection. Pituitary gland suprasellar region normal. Midline structures intact. Vascular: Major intracranial vascular flow voids are maintained. Skull and upper cervical spine: Craniocervical junction within normal limits. Degenerative spondylosis noted at C3-4 with resultant mild spinal stenosis. Bone marrow signal intensity within normal limits. Evolving left parietal scalp hematoma with skin staples in place. Sinuses/Orbits: Globes and orbital soft tissues demonstrate no acute finding. Paranasal sinuses are largely clear. Right greater than left mastoid effusions noted. Visualized nasopharynx within normal limits. Other: None. IMPRESSION: 1. No acute intracranial abnormality. 2. Mildly advanced age-related cerebral atrophy with chronic small vessel ischemic disease. 3. Evolving left parietal scalp contusion with skin staples in place. 4. Bilateral mastoid effusions, right greater than left. Electronically Signed   By: Jeannine Boga M.D.   On: 02/25/2019 01:17   CT CEREBRAL PERFUSION W CONTRAST  Result Date: 02/24/2019 CLINICAL DATA:  Nonreactive right pupil EXAM: CT ANGIOGRAPHY HEAD AND NECK CT PERFUSION BRAIN TECHNIQUE: Multidetector CT imaging of the head and neck was performed using the standard protocol during bolus administration of intravenous contrast. Multiplanar CT image reconstructions and MIPs were obtained to evaluate the vascular anatomy. Carotid stenosis measurements (when applicable) are obtained utilizing NASCET criteria, using the distal internal carotid diameter as the denominator. Multiphase CT imaging of the brain was performed following IV bolus contrast injection. Subsequent parametric perfusion maps were calculated using RAPID software. CONTRAST:  164mL OMNIPAQUE IOHEXOL 350 MG/ML SOLN COMPARISON:  2018 FINDINGS: CTA NECK FINDINGS Aortic arch: Great vessel origins  are patent. There is direct origin of the left vertebral artery from the arch. Right carotid system: Patent. Primarily calcified plaque at the carotid bifurcation and proximal internal carotid causing less than 50% stenosis. Left carotid system: Patent. Primarily calcified plaque at the bifurcation and proximal internal carotid causing less 50% stenosis. Vertebral arteries: Patent. Skeleton: Advanced multilevel degenerative changes of the cervical spine. Other neck: No neck mass or adenopathy. Upper chest: No apical lung mass. Review of the MIP images confirms the above findings CTA HEAD FINDINGS Anterior circulation: Intracranial internal carotid arteries are patent with mild calcified plaque. Anterior  and middle cerebral arteries are patent. Posterior circulation: Intracranial vertebral arteries, basilar artery, and posterior cerebral arteries are patent. A right posterior communicating artery is identified. Venous sinuses: As permitted by contrast timing, patent. Anatomic variants: Fetal or near fetal origin of the right posterior cerebral artery. Review of the MIP images confirms the above findings CT Brain Perfusion Findings: ASPECTS: 10 CBF (<30%) Volume: 36mL Perfusion (Tmax>6.0s) volume: 52mL Mismatch Volume: 31mL Infarction Location:None IMPRESSION: No large vessel occlusion. No regional perfusion deficit or territory at risk by perfusion imaging. Plaque at the ICA origins causing less than 50% stenosis. These results were communicated to Dr. Rory Percy at [place] 12/31/2020by text page via the [facility] messaging system. Electronically Signed   By: Macy Mis M.D.   On: 02/24/2019 16:28   EEG adult  Result Date: 02/25/2019 Lora Havens, MD     02/25/2019 12:28 PM Patient Name: KEATAN VANDEVOORT MRN: [id number] Epilepsy Attending: Lora Havens Referring Physician/Provider: Dr. Amie [place] Date: 02/25/2019 Duration: 23.45 minutes Patient history: 65 year old male with history of alcohol use and  alcohol-related seizures asymmetry of Keppra and altered mental status.  EEG to evaluate for seizures. Level of alertness: awake, asleep AEDs during EEG study: None Technical aspects: This EEG study was done with scalp electrodes positioned according to the 10-20 International system of electrode placement. Electrical activity was acquired at a sampling rate of 500Hz  and reviewed with a high frequency filter of 70Hz  and a low frequency filter of 1Hz . EEG data were recorded continuously and digitally stored. DESCRIPTION:  The posterior dominant rhythm consists of 8-9 Hz activity of moderate voltage (25-35 uV) seen predominantly in posterior head regions, symmetric and reactive to eye opening and eye closing.    Sleep was characterized vertex waves. Hyperventilation and photic stimulation were not performed. IMPRESSION: This study is within normal limits.  No seizures or epileptiform discharges were seen throughout the recording. Lora Havens   CT HEAD CODE STROKE WO CONTRAST  Result Date: 02/24/2019 CLINICAL DATA:  Code stroke.  Right pupil nonreactive EXAM: CT HEAD WITHOUT CONTRAST TECHNIQUE: Contiguous axial images were obtained from the base of the skull through the vertex without intravenous contrast. COMPARISON:  02/22/2019 FINDINGS: Brain: There is no acute intracranial hemorrhage, mass effect, or edema. Gray-white differentiation is preserved. Prominence of the ventricles and sulci reflects stable generalized parenchymal volume loss. Patchy hypoattenuation in the supratentorial white matter is nonspecific but likely reflects stable chronic microvascular ischemic changes. Vascular: No hyperdense vessel. There is intracranial atherosclerotic calcification at the skull base. Skull: Unremarkable. Sinuses/Orbits: Trace mucosal thickening. Other: Minor patchy mastoid opacification. Similar left scalp hematoma. ASPECTS [facility] Stroke Program Early CT Score) - Ganglionic level infarction (caudate, lentiform  nuclei, internal capsule, insula, M1-M3 cortex): 7 - Supraganglionic infarction (M4-M6 cortex): 3 Total score (0-10 with 10 being normal): 10 IMPRESSION: 1. No acute intracranial hemorrhage or evidence of acute infarction. Stable chronic findings detailed above. 2. ASPECTS is 10 These results were communicated to Dr. Rory Percy at 4:04 pmon 12/31/2020by text page via the [facility] messaging system. Electronically Signed   By: Macy Mis M.D.   On: 02/24/2019 16:05    Assessment/Plan  1. Alcohol abuse with intoxication ([place]) Had PRN Librium in the hospital, unable to tolerate Ativan according to wife, it caused agitation and somnolence - thiamine 100 MG tablet; Take 1 tablet (100 mg total) by mouth daily.  Dispense: 28 tablet; Refill: 0 - folic acid (FOLVITE) 1 MG tablet; Take 1 tablet (1 mg total)  by mouth daily.  Dispense: 28 tablet; Refill: 0 - Multiple Vitamin (MULTIVITAMIN WITH MINERALS) TABS tablet; Take 1 tablet by mouth daily.  Dispense: 28 tablet; Refill: 0  2. Laceration of scalp, sequela - staples were removed, healed  3. Tobacco abuse -Counseled - nicotine (NICODERM CQ - DOSED IN MG/24 HOURS) 14 mg/24hr patch; Place 1 patch (14 mg total) onto the skin daily. Remove old patch prior to placing the new patch  Dispense: 28 patch; Refill: 0  4. Hypomagnesemia - magnesium oxide (MAG-OX) 400 (241.3 Mg) MG tablet; Take 1 tablet (400 mg total) by mouth 2 (two) times daily.  Dispense: 56 tablet; Refill: 0  5. Frequent falls -For home health PT and OT, for therapeutic extremity exercises, fall precautions    I have filled out patient's discharge paperwork and e-prescribed medications.  Patient will receive home health PT and OT.  DME provided: [place]  Total discharge time: Greater than 30 minutes Greater than 50% was spent in counseling and coordination of care.    Discharge time involved coordination of the discharge process with social worker, nursing staff and therapy department.  Medical justification for home health services/DME verified.    Durenda Age, DNP, FNP-BC [facility] [phone] (Monday-Friday 8:00 a.m. - 5:00 p.m.) [phone] (after hours)

## 2019-03-14 ENCOUNTER — Telehealth: Payer: Self-pay | Admitting: Physician Assistant

## 2019-03-14 NOTE — Telephone Encounter (Signed)
Patient's wife [name] calls in saying that her husband just got out of rehab, and has been experiencing some itching. She thinks it could be from one of the new prescriptions, but she has tried giving him benadryl but it is not helping. [name] is wanting to know if there is anything she can give him to help.

## 2019-03-14 NOTE — Telephone Encounter (Signed)
Spoke with the wife, said she understood and would speak to husband about going to urgent care.

## 2019-03-14 NOTE — Telephone Encounter (Signed)
Pt has never seen Aldona Bar has an upcoming appt on 04/04/2019. We can not recommend anything. Pt needs an appointment or can call old PCP or go to Urgent care for issue.

## 2019-03-17 ENCOUNTER — Telehealth: Payer: Self-pay

## 2019-03-17 DIAGNOSIS — R4189 Other symptoms and signs involving cognitive functions and awareness: Secondary | ICD-10-CM | POA: Diagnosis not present

## 2019-03-17 DIAGNOSIS — R296 Repeated falls: Secondary | ICD-10-CM | POA: Diagnosis not present

## 2019-03-17 DIAGNOSIS — R1312 Dysphagia, oropharyngeal phase: Secondary | ICD-10-CM

## 2019-03-17 DIAGNOSIS — M6281 Muscle weakness (generalized): Secondary | ICD-10-CM | POA: Diagnosis not present

## 2019-03-17 DIAGNOSIS — R278 Other lack of coordination: Secondary | ICD-10-CM | POA: Diagnosis not present

## 2019-03-17 DIAGNOSIS — D539 Nutritional anemia, unspecified: Secondary | ICD-10-CM

## 2019-03-17 DIAGNOSIS — S0101XD Laceration without foreign body of scalp, subsequent encounter: Secondary | ICD-10-CM | POA: Diagnosis not present

## 2019-03-17 DIAGNOSIS — F17200 Nicotine dependence, unspecified, uncomplicated: Secondary | ICD-10-CM

## 2019-03-17 DIAGNOSIS — G40909 Epilepsy, unspecified, not intractable, without status epilepticus: Secondary | ICD-10-CM

## 2019-03-17 NOTE — Telephone Encounter (Signed)
Called Sabra back at [phone]. Told her unfortunately I can not give orders. Patient has not established with [place] yet, upcoming appt is 04/04/2019. Told her maybe she can contact the physician who gave the original order. Sabra verbalized understanding.

## 2019-03-17 NOTE — Telephone Encounter (Signed)
Sabra from home health would like for pt to continue physical therapy . Request a calll back to [phone]

## 2019-03-28 DIAGNOSIS — F17200 Nicotine dependence, unspecified, uncomplicated: Secondary | ICD-10-CM | POA: Diagnosis not present

## 2019-03-28 DIAGNOSIS — D539 Nutritional anemia, unspecified: Secondary | ICD-10-CM | POA: Diagnosis not present

## 2019-03-28 DIAGNOSIS — R4189 Other symptoms and signs involving cognitive functions and awareness: Secondary | ICD-10-CM | POA: Diagnosis not present

## 2019-03-28 DIAGNOSIS — R1312 Dysphagia, oropharyngeal phase: Secondary | ICD-10-CM | POA: Diagnosis not present

## 2019-03-28 DIAGNOSIS — R278 Other lack of coordination: Secondary | ICD-10-CM | POA: Diagnosis not present

## 2019-03-28 DIAGNOSIS — R296 Repeated falls: Secondary | ICD-10-CM | POA: Diagnosis not present

## 2019-03-28 DIAGNOSIS — S0101XD Laceration without foreign body of scalp, subsequent encounter: Secondary | ICD-10-CM | POA: Diagnosis not present

## 2019-03-28 DIAGNOSIS — G40909 Epilepsy, unspecified, not intractable, without status epilepticus: Secondary | ICD-10-CM | POA: Diagnosis not present

## 2019-03-28 DIAGNOSIS — M6281 Muscle weakness (generalized): Secondary | ICD-10-CM | POA: Diagnosis not present

## 2019-04-01 ENCOUNTER — Other Ambulatory Visit: Payer: Self-pay

## 2019-04-04 ENCOUNTER — Encounter: Payer: Self-pay | Admitting: Physician Assistant

## 2019-04-04 ENCOUNTER — Other Ambulatory Visit: Payer: Self-pay

## 2019-04-04 ENCOUNTER — Ambulatory Visit (INDEPENDENT_AMBULATORY_CARE_PROVIDER_SITE_OTHER): Payer: PPO | Admitting: Physician Assistant

## 2019-04-04 VITALS — BP 130/70 | HR 96 | Temp 97.4°F | Ht 63.5 in | Wt 125.4 lb

## 2019-04-04 DIAGNOSIS — Z91199 Patient's noncompliance with other medical treatment and regimen due to unspecified reason: Secondary | ICD-10-CM

## 2019-04-04 DIAGNOSIS — R569 Unspecified convulsions: Secondary | ICD-10-CM

## 2019-04-04 DIAGNOSIS — Z23 Encounter for immunization: Secondary | ICD-10-CM

## 2019-04-04 DIAGNOSIS — Z5329 Procedure and treatment not carried out because of patient's decision for other reasons: Secondary | ICD-10-CM | POA: Insufficient documentation

## 2019-04-04 DIAGNOSIS — Z720 Tobacco use: Secondary | ICD-10-CM

## 2019-04-04 DIAGNOSIS — F10129 Alcohol abuse with intoxication, unspecified: Secondary | ICD-10-CM

## 2019-04-04 DIAGNOSIS — L0291 Cutaneous abscess, unspecified: Secondary | ICD-10-CM | POA: Diagnosis not present

## 2019-04-04 MED ORDER — DOXYCYCLINE HYCLATE 100 MG PO TABS: 100.0000 mg | ORAL_TABLET | Freq: Two times a day (BID) | ORAL | 0 refills | Status: DC

## 2019-04-04 NOTE — Patient Instructions (Signed)
It was great to see you!  Please let us know if you have any questions or concerns.  If you develop worsening redness or increase pus drainage, please start the oral antibiotic.  If you develop fever, chills, severe pain, please call us or go to urgent care or the emergency room.  Take care,  Inda Coke PA-C   Incision and Drainage, Care After This sheet gives you information about how to care for yourself after your procedure. Your health care provider may also give you more specific instructions. If you have problems or questions, contact your health care provider. What can I expect after the procedure? After the procedure, it is common to have:  Pain or discomfort around the incision site.  Blood, fluid, or pus (drainage) from the incision.  Redness and firm skin around the incision site. Follow these instructions at home: Medicines  Take over-the-counter and prescription medicines only as told by your health care provider.  If you were prescribed an antibiotic medicine, use or take it as told by your health care provider. Do not stop using the antibiotic even if you start to feel better. Wound care Follow instructions from your health care provider about how to take care of your wound. Make sure you:  Wash your hands with soap and water before and after you change your bandage (dressing). If soap and water are not available, use hand sanitizer.  Change your dressing and packing as told by your health care provider. ? If your dressing is dry or stuck when you try to remove it, moisten or wet the dressing with saline or water so that it can be removed without harming your skin or tissues. ? If your wound is packed, leave it in place until your health care provider tells you to remove it. To remove the packing, moisten or wet the packing with saline or water so that it can be removed without harming your skin or tissues.  Leave stitches (sutures), skin glue, or adhesive  strips in place. These skin closures may need to stay in place for 2 weeks or longer. If adhesive strip edges start to loosen and curl up, you may trim the loose edges. Do not remove adhesive strips completely unless your health care provider tells you to do that. Check your wound every day for signs of infection. Check for:  More redness, swelling, or pain.  More fluid or blood.  Warmth.  Pus or a bad smell. If you were sent home with a drain tube in place, follow instructions from your health care provider about:  How to empty it.  How to care for it at home.  General instructions  Rest the affected area.  Do not take baths, swim, or use a hot tub until your health care provider approves. Ask your health care provider if you may take showers. You may only be allowed to take sponge baths.  Return to your normal activities as told by your health care provider. Ask your health care provider what activities are safe for you. Your health care provider may put you on activity or lifting restrictions.  The incision will continue to drain. It is normal to have some clear or slightly bloody drainage. The amount of drainage should lessen each day.  Do not apply any creams, ointments, or liquids unless you have been told to by your health care provider.  Keep all follow-up visits as told by your health care provider. This is important. Contact a health care  provider if:  Your cyst or abscess returns.  You have a fever or chills.  You have more redness, swelling, or pain around your incision.  You have more fluid or blood coming from your incision.  Your incision feels warm to the touch.  You have pus or a bad smell coming from your incision.  You have red streaks above or below the incision site. Get help right away if:  You have severe pain or bleeding.  You cannot eat or drink without vomiting.  You have decreased urine output.  You become short of breath.  You have  chest pain.  You cough up blood.  The affected area becomes numb or starts to tingle. These symptoms may represent a serious problem that is an emergency. Do not wait to see if the symptoms will go away. Get medical help right away. Call your local emergency services (911 in the [place] Do not drive yourself to the hospital. Summary  After this procedure, it is common to have fluid, blood, or pus coming from the surgery site.  Follow all home care instructions. You will be told how to take care of your incision, how to check for infection, and how to take medicines.  If you were prescribed an antibiotic medicine, take it as told by your health care provider. Do not stop taking the antibiotic even if you start to feel better.  Contact a health care provider if you have increased redness, swelling, or pain around your incision. Get help right away if you have chest pain, you vomit, you cough up blood, or you have shortness of breath.  Keep all follow-up visits as told by your health care provider. This is important. This information is not intended to replace advice given to you by your health care provider. Make sure you discuss any questions you have with your health care provider. Document Revised: 01/11/2018 Document Reviewed: 01/11/2018 Elsevier Patient Education  2020 [employer].

## 2019-04-04 NOTE — Addendum Note (Signed)
Addended by: Marian Sorrow on: 04/04/2019 09:54 AM   Modules accepted: Orders

## 2019-04-04 NOTE — Progress Notes (Signed)
Edwin Torres is a 64 y.o. male here to Establish care.  I acted as a Education administrator for [employer], PA-C Anselmo Pickler, LPN  History of Present Illness:   Chief Complaint  Patient presents with  . Establish Care  . Mass    Abdominal mass -- Pt c/o lump right side of abdomen, noticed two weeks ago. Lump is red, denies pain. Has not tried anything for his symptoms. Denies fevers, chills, nausea, vomiting, malaise.  Seizures -- history of this. States that he never had work-up for this. States that it has been at least 10 years since last seizure.   Alcohol abuse -- started drinking at 52. Only drinks beer. Up to 1 case per day, but this is rare per wife. Last drink 02/22/2020. He is hoping to stay sober. Family history of alcohol abuse.  Tobacco abuse -- started around age 61. Currently smokes about 1 PPD.   Patient has not had PCP prior to me. Patient declines all preventative labs, screenings, tests.  Health Maintenance: Immunizations -- UTD, declines Flu, will give Prenar 13 today Colonoscopy -- never Weight -- Weight: 125 lb 6.1 oz (56.9 kg)    Depression screen [facility] 2/9 08/20/2016  Decreased Interest 0  Down, Depressed, Hopeless 0  PHQ - 2 Score 0    No flowsheet data found.   Other providers/specialists: Patient Care Team: Inda Coke, [place] as PCP - General (Physician Assistant)   Past Medical History:  Diagnosis Date  . Alcohol addiction ([place])   . Allergy   . Cancer ([place])    Melanoma on face was removed  . Depression   . Herpes genitalis in men   . History of chicken pox   . Seizures ([place])      Social History   Socioeconomic History  . Marital status: Married    Spouse name: Not on file  . Number of children: Not on file  . Years of education: Not on file  . Highest education level: Not on file  Occupational History  . Not on file  Tobacco Use  . Smoking status: Current Every Day Smoker    Packs/day: 1.00  . Smokeless tobacco: Current User   Substance and Sexual Activity  . Alcohol use: Yes    Alcohol/week: 8.0 standard drinks    Types: 8 Cans of beer per week    Comment: daily  . Drug use: No  . Sexual activity: Yes    Partners: Female    Birth control/protection: None  Other Topics Concern  . Not on file  Social History Narrative   2012 fell from roof, disabled since   Social Determinants of Health   Financial Resource Strain:   . Difficulty of Paying Living Expenses: Not on file  Food Insecurity:   . Worried About Charity fundraiser in the Last Year: Not on file  . Ran Out of Food in the Last Year: Not on file  Transportation Needs:   . Lack of Transportation (Medical): Not on file  . Lack of Transportation (Non-Medical): Not on file  Physical Activity:   . Days of Exercise per Week: Not on file  . Minutes of Exercise per Session: Not on file  Stress:   . Feeling of Stress : Not on file  Social Connections:   . Frequency of Communication with Friends and Family: Not on file  . Frequency of Social Gatherings with Friends and Family: Not on file  . Attends Religious Services: Not on file  .  Active Member of Clubs or Organizations: Not on file  . Attends Archivist Meetings: Not on file  . Marital Status: Not on file  Intimate Partner Violence:   . Fear of Current or Ex-Partner: Not on file  . Emotionally Abused: Not on file  . Physically Abused: Not on file  . Sexually Abused: Not on file    Past Surgical History:  Procedure Laterality Date  . FOOT FRACTURE SURGERY Bilateral   . NOSE SURGERY    . VASECTOMY      Family History  Problem Relation Age of Onset  . Cancer Mother        bladder  . Diabetes Mother   . Arthritis Mother   . Hyperlipidemia Father   . Alcohol abuse Father   . Arthritis Sister     Allergies  Allergen Reactions  . Penicillins Anaphylaxis    Has patient had a PCN reaction causing immediate rash, facial/tongue/throat swelling, SOB or lightheadedness with  hypotension: Yes Has patient had a PCN reaction causing severe rash involving mucus membranes or skin necrosis: No Has patient had a PCN reaction that required hospitalization: Yes Has patient had a PCN reaction occurring within the last 10 years: No If all of the above answers are "NO", then may proceed with Cephalosporin use.   . Ativan [Lorazepam] Other (See Comments)    Made him crazy     Current Medications:   Current Outpatient Medications:  .  feeding supplement, ENSURE ENLIVE, (ENSURE ENLIVE) LIQD, Take 237 mLs by mouth 2 (two) times daily between meals., Disp: , Rfl:  .  folic acid (FOLVITE) 1 MG tablet, Take 1 tablet (1 mg total) by mouth daily., Disp: 28 tablet, Rfl: 0 .  ibuprofen (ADVIL) 200 MG tablet, Take 600 mg by mouth every 6 (six) hours as needed., Disp: , Rfl:  .  magnesium oxide (MAG-OX) 400 (241.3 Mg) MG tablet, Take 1 tablet (400 mg total) by mouth 2 (two) times daily., Disp: 56 tablet, Rfl: 0 .  thiamine 100 MG tablet, Take 100 mg by mouth daily., Disp: , Rfl:  .  doxycycline (VIBRA-TABS) 100 MG tablet, Take 1 tablet (100 mg total) by mouth 2 (two) times daily., Disp: 20 tablet, Rfl: 0   Review of Systems:   ROS  Negative unless otherwise specified per HPI.  Vitals:   Vitals:   04/04/19 0827  BP: 130/70  Pulse: 96  Temp: (!) 97.4 F (36.3 C)  TempSrc: Temporal  Weight: 125 lb 6.1 oz (56.9 kg)  Height: 5' 3.5" (1.613 m)      Body mass index is 21.86 kg/m.  Physical Exam:   Physical Exam Vitals and nursing note reviewed.  Constitutional:      General: He is not in acute distress.    Appearance: He is well-developed. He is not ill-appearing or toxic-appearing.  Cardiovascular:     Rate and Rhythm: Normal rate and regular rhythm.     Pulses: Normal pulses.     Heart sounds: Normal heart sounds, S1 normal and S2 normal.     Comments: No LE edema Pulmonary:     Effort: Pulmonary effort is normal.     Breath sounds: Normal breath sounds.   Abdominal:    Skin:    General: Skin is warm and dry.  Neurological:     Mental Status: He is alert.     GCS: GCS eye subscore is 4. GCS verbal subscore is 5. GCS motor subscore is 6.  Psychiatric:  Speech: Speech normal.        Behavior: Behavior normal. Behavior is cooperative.    I&D Meds, vitals, and allergies reviewed.  Indication: suspect abscess  Pt complaints of: erythema, pain, swelling  Location: R abdominal wall Size: approximately 1-inch  Informed verbal consent obtained.  Pt aware of risks not limited to but including infection, bleeding, damage to near by organs.  Prep: etoh/betadine Anesthesia: 1%lidocaine with epi, good effect Incision made with #11 blade Wound explored and loculations removed Wound packed with iodoform gauze Tolerated well   Assessment and Plan:   Tomy was seen today for establish care and mass.  Diagnoses and all orders for this visit:  Abscess Tolerated procedure well. Routine postprocedure instructions d/w pt, keep area clean and bandaged, follow up if concerns/spreading erythema/pain. I did give him a safety net rx for oral doxycycline should his redness worsen or develops worsening discharge. Low threshold to follow-up in the office for further evaluation and treatment if worsening infection, development of fever, chills, or other signs of infections.  Seizures ([place]) States that these have resolved. Further work-up based on clinical response.  Alcohol abuse with intoxication ([place]) Encouraged continued cessation. He declines any further resources at this time.  Tobacco abuse Encouraged cessation, he is not ready to quit at this time.  Patient non-compliant, refused intervention or support Declined all preventative tests, labs, screenings even if they could potentially prolong life.   Other orders -     doxycycline (VIBRA-TABS) 100 MG tablet; Take 1 tablet (100 mg total) by mouth 2 (two) times daily.  . Reviewed  expectations re: course of current medical issues. . Discussed self-management of symptoms. . Outlined signs and symptoms indicating need for more acute intervention. . Patient verbalized understanding and all questions were answered. . See orders for this visit as documented in the electronic medical record. . Patient received an After-Visit Summary.  CMA or LPN served as scribe during this visit. History, Physical, and Plan performed by medical provider. The above documentation has been reviewed and is accurate and complete.  Inda Coke, PA-C

## 2019-04-05 ENCOUNTER — Telehealth: Payer: Self-pay | Admitting: Physician Assistant

## 2019-04-05 MED ORDER — HYDROXYZINE HCL 25 MG PO TABS: 25.0000 mg | ORAL_TABLET | Freq: Two times a day (BID) | ORAL | 0 refills | Status: DC | PRN

## 2019-04-05 NOTE — Telephone Encounter (Signed)
Left message on voicemail to call office.  

## 2019-04-05 NOTE — Telephone Encounter (Signed)
Pt's wife [name] called back, told her going to send Rx for Atarax 25 mg twice a day for itching. If no improvement need to follow up. Will send Rx to pharmacy. [name] verbalized understanding. Rx sent

## 2019-04-05 NOTE — Telephone Encounter (Signed)
Pt wife called stating she forgot to ask about pt itching during hospital f/u yesterday. Wife states pt is having bad itching all over to where it keeps him up at night. Requested to have a prescription to help. Please advise.

## 2019-04-05 NOTE — Telephone Encounter (Signed)
Please see message and advise 

## 2019-04-05 NOTE — Telephone Encounter (Signed)
Ok to trial atarax 25 mg BID prn for itching. Follow-up if no improvement.

## 2019-05-19 ENCOUNTER — Ambulatory Visit (INDEPENDENT_AMBULATORY_CARE_PROVIDER_SITE_OTHER): Payer: PPO

## 2019-05-19 ENCOUNTER — Encounter (INDEPENDENT_AMBULATORY_CARE_PROVIDER_SITE_OTHER): Payer: Self-pay

## 2019-05-19 ENCOUNTER — Other Ambulatory Visit: Payer: Self-pay

## 2019-05-19 DIAGNOSIS — Z0000 Encounter for general adult medical examination without abnormal findings: Secondary | ICD-10-CM | POA: Diagnosis not present

## 2019-05-19 NOTE — Progress Notes (Signed)
This visit is being conducted via phone call due to the COVID-19 pandemic. This patient has given me verbal consent via phone to conduct this visit, patient states they are participating from their home address. Some vital signs may be absent or patient reported.   Patient identification: identified by name, DOB, and current address.  Location provider: [facility], Office Persons participating in the virtual visit: Denman George LPN, patient, and Inda Coke PA   Subjective:   Edwin Torres is a 64 y.o. male who presents for an Initial Medicare Annual Wellness Visit.  Review of Systems   Cardiac Risk Factors include: advanced age (>80men, >62 women);smoking/ tobacco exposure;male gender   Objective:    There were no vitals filed for this visit. There is no height or weight on file to calculate BMI.  Advanced Directives 05/19/2019 03/10/2019 03/04/2019 02/23/2019 02/22/2019 03/18/2016  Does Patient Have a Medical Advance Directive? No Yes Yes - No No  Type of Advance Directive - (No Data) (No Data) - - -  Does patient want to make changes to medical advance directive? - No - Patient declined No - Patient declined - - -  Would patient like information on creating a medical advance directive? Yes (MAU/Ambulatory/Procedural Areas - Information given) - - No - Patient declined - -    Current Medications (verified) Outpatient Encounter Medications as of 05/19/2019  Medication Sig  . feeding supplement, ENSURE ENLIVE, (ENSURE ENLIVE) LIQD Take 237 mLs by mouth 2 (two) times daily between meals.  . folic acid (FOLVITE) 1 MG tablet Take 1 tablet (1 mg total) by mouth daily.  . hydrOXYzine (ATARAX/VISTARIL) 25 MG tablet Take 1 tablet (25 mg total) by mouth 2 (two) times daily as needed for itching.  Marland Kitchen ibuprofen (ADVIL) 200 MG tablet Take 600 mg by mouth every 6 (six) hours as needed.  . magnesium oxide (MAG-OX) 400 (241.3 Mg) MG tablet Take 1 tablet (400 mg total) by mouth 2 (two) times  daily.  Marland Kitchen thiamine 100 MG tablet Take 100 mg by mouth daily.  . [DISCONTINUED] doxycycline (VIBRA-TABS) 100 MG tablet Take 1 tablet (100 mg total) by mouth 2 (two) times daily.   No facility-administered encounter medications on file as of 05/19/2019.    Allergies (verified) Penicillins and Ativan [lorazepam]   History: Past Medical History:  Diagnosis Date  . Alcohol addiction ([place])   . Allergy   . Cancer ([place])    Melanoma on face was removed  . Depression   . Herpes genitalis in men   . History of chicken pox   . Seizures ([place])    Past Surgical History:  Procedure Laterality Date  . FOOT FRACTURE SURGERY Bilateral   . NOSE SURGERY    . VASECTOMY     Family History  Problem Relation Age of Onset  . Cancer Mother        bladder  . Diabetes Mother   . Arthritis Mother   . Hyperlipidemia Father   . Alcohol abuse Father   . Arthritis Sister    Social History   Socioeconomic History  . Marital status: Married    Spouse name: Not on file  . Number of children: Not on file  . Years of education: Not on file  . Highest education level: Not on file  Occupational History  . Not on file  Tobacco Use  . Smoking status: Current Every Day Smoker    Packs/day: 1.00  . Smokeless tobacco: Current User  Substance and  Sexual Activity  . Alcohol use: Yes    Alcohol/week: 8.0 standard drinks    Types: 8 Cans of beer per week    Comment: daily  . Drug use: No  . Sexual activity: Yes    Partners: Female    Birth control/protection: None  Other Topics Concern  . Not on file  Social History Narrative   2012 fell from roof, disabled since   Social Determinants of Health   Financial Resource Strain:   . Difficulty of Paying Living Expenses:   Food Insecurity:   . Worried About Charity fundraiser in the Last Year:   . Arboriculturist in the Last Year:   Transportation Needs:   . Film/video editor (Medical):   Marland Kitchen Lack of Transportation (Non-Medical):   Physical  Activity:   . Days of Exercise per Week:   . Minutes of Exercise per Session:   Stress:   . Feeling of Stress :   Social Connections:   . Frequency of Communication with Friends and Family:   . Frequency of Social Gatherings with Friends and Family:   . Attends Religious Services:   . Active Member of Clubs or Organizations:   . Attends Archivist Meetings:   Marland Kitchen Marital Status:    Tobacco Counseling Ready to quit: Not Answered Counseling given: Not Answered   Clinical Intake:  Pre-visit preparation completed: Yes  Pain : No/denies pain  Diabetes: No  How often do you need to have someone help you when you read instructions, pamphlets, or other written materials from your doctor or pharmacy?: 2 - Rarely  Interpreter Needed?: No  Information entered by :: Denman George LPN  Activities of Daily Living In your present state of health, do you have any difficulty performing the following activities: 05/19/2019 02/23/2019  Hearing? N N  Vision? N N  Difficulty concentrating or making decisions? N Y  Walking or climbing stairs? Y Y  Comment intermittently -  Dressing or bathing? N Y  Doing errands, shopping? N N  Preparing Food and eating ? N -  Using the Toilet? N -  In the past six months, have you accidently leaked urine? N -  Do you have problems with loss of bowel control? N -  Managing your Medications? N -  Managing your Finances? N -  Housekeeping or managing your Housekeeping? N -  Some recent data might be hidden     Immunizations and Health Maintenance Immunization History  Administered Date(s) Administered  . Pneumococcal Conjugate-13 04/04/2019  . Tdap 07/11/2016, 02/22/2019   There are no preventive care reminders to display for this patient.  Patient Care Team: Inda Coke, [place] as PCP - General (Physician Assistant)  Indicate any recent Medical Services you may have received from other than [facility] providers in the past year (date may be  approximate).    Assessment:   This is a routine wellness examination for Edwin Torres.  Hearing/Vision screen No exam data present  Dietary issues and exercise activities discussed: Current Exercise Habits: The patient does not participate in regular exercise at present  Goals   None    Depression Screen PHQ 2/9 Scores 05/19/2019 08/20/2016 03/10/2016  PHQ - 2 Score 0 0 0    Fall Risk Fall Risk  05/19/2019 08/20/2016 05/14/2016 03/21/2016 03/10/2016  Falls in the past year? 1 No No No No  Number falls in past yr: 1 - - - -  Injury with Fall? 1 - - - -  Risk for fall due to : History of fall(s);Impaired mobility - - - -  Follow up Education provided;Falls prevention discussed;Falls evaluation completed - - - -    Is the patient's home free of loose throw rugs in walkways, pet beds, electrical cords, etc?   yes      Grab bars in the bathroom? yes      Handrails on the stairs?   yes      Adequate lighting?   yes  Cognitive Function:     6CIT Screen 05/19/2019  What Year? 0 points  What month? 0 points  What time? 0 points  Count back from 20 0 points  Months in reverse 0 points  Repeat phrase 0 points  Total Score 0    Screening Tests Health Maintenance  Topic Date Due  . COLONOSCOPY  04/03/2020 (Originally 03/30/2004)  . Hepatitis C Screening  04/03/2020 (Originally 10/20/1955)  . PNA vac Low Risk Adult (2 of 2 - PPSV23) 04/03/2020  . TETANUS/TDAP  02/21/2029  . HIV Screening  Completed  . INFLUENZA VACCINE  Discontinued    Qualifies for Shingles Vaccine? Discussed and patient will check with pharmacy for coverage.  Patient education handout provided   Cancer Screenings: Lung: Low Dose CT Chest recommended if Age 81-80 years, 30 pack-year currently smoking OR have quit w/in 15years. Patient does qualify. (declines)  Colorectal: declines; Cologuard information provided     Plan:  I have personally reviewed and addressed the Medicare Annual Wellness questionnaire and have  noted the following in the patient's chart:  A. Medical and social history B. Use of alcohol, tobacco or illicit drugs  C. Current medications and supplements D. Functional ability and status E.  Nutritional status F.  Physical activity G. Advance directives H. List of other physicians I.  Hospitalizations, surgeries, and ER visits in previous 12 months J.  [place] such as hearing and vision if needed, cognitive and depression L. Referrals, records requested, and appointments- none   In addition, I have reviewed and discussed with patient certain preventive protocols, quality metrics, and best practice recommendations. A written personalized care plan for preventive services as well as general preventive health recommendations were provided to patient.   Signed,  Denman George, LPN  Nurse Health Advisor   Nurse Notes: no additional

## 2019-05-19 NOTE — Patient Instructions (Signed)
[name] , Thank you for taking time to come for your Medicare Wellness Visit. I appreciate your ongoing commitment to your health goals. Please review the following plan we discussed and let me know if I can assist you in the future.   Screening recommendations/referrals: Colorectal Screening: recommended; see Cologuard information   Vision and Dental Exams: Recommended annual ophthalmology exams for early detection of glaucoma and other disorders of the eye Recommended annual dental exams for proper oral hygiene  Vaccinations: Influenza vaccine: recommended  Pneumococcal vaccine: up to date; last 04/04/19 Tdap vaccine: up to date; last 02/22/19  Shingles vaccine:  You may receive this vaccine at your local pharmacy. (see handout)   Advanced directives:  I have provided a copy for you to complete at home and have notarized. Once this is complete please bring a copy in to our office so we can scan it into your chart.  Goals: Recommend to drink at least 6-8 8oz glasses of water per day and consume a balanced diet rich in fresh fruits and vegetables.   Next appointment: Please schedule your Annual Wellness Visit with your Nurse Health Advisor in one year.  Preventive Care 20 Years and Older, Male Preventive care refers to lifestyle choices and visits with your health care provider that can promote health and wellness. What does preventive care include?  A yearly physical exam. This is also called an annual well check.  Dental exams once or twice a year.  Routine eye exams. Ask your health care provider how often you should have your eyes checked.  Personal lifestyle choices, including:  Daily care of your teeth and gums.  Regular physical activity.  Eating a healthy diet.  Avoiding tobacco and drug use.  Limiting alcohol use.  Practicing safe sex.  Taking low doses of aspirin every day if recommended by your health care provider..  Taking vitamin and mineral supplements  as recommended by your health care provider. What happens during an annual well check? The services and screenings done by your health care provider during your annual well check will depend on your age, overall health, lifestyle risk factors, and family history of disease. Counseling  Your health care provider may ask you questions about your:  Alcohol use.  Tobacco use.  Drug use.  Emotional well-being.  Home and relationship well-being.  Sexual activity.  Eating habits.  History of falls.  Memory and ability to understand (cognition).  Work and work Statistician. Screening  You may have the following tests or measurements:  Height, weight, and BMI.  Blood pressure.  Lipid and cholesterol levels. These may be checked every 5 years, or more frequently if you are over 95 years old.  Skin check.  Lung cancer screening. You may have this screening every year starting at age 13 if you have a 30-pack-year history of smoking and currently smoke or have quit within the past 15 years.  Fecal occult blood test (FOBT) of the stool. You may have this test every year starting at age 50.  Flexible sigmoidoscopy or colonoscopy. You may have a sigmoidoscopy every 5 years or a colonoscopy every 10 years starting at age 54.  Prostate cancer screening. Recommendations will vary depending on your family history and other risks.  Hepatitis C blood test.  Hepatitis B blood test.  Sexually transmitted disease (STD) testing.  Diabetes screening. This is done by checking your blood sugar (glucose) after you have not eaten for a while (fasting). You may have this done every  1-3 years.  Abdominal aortic aneurysm (AAA) screening. You may need this if you are a current or former smoker.  Osteoporosis. You may be screened starting at age 53 if you are at high risk. Talk with your health care provider about your test results, treatment options, and if necessary, the need for more  tests. Vaccines  Your health care provider may recommend certain vaccines, such as:  Influenza vaccine. This is recommended every year.  Tetanus, diphtheria, and acellular pertussis (Tdap, Td) vaccine. You may need a Td booster every 10 years.  Zoster vaccine. You may need this after age 38.  Pneumococcal 13-valent conjugate (PCV13) vaccine. One dose is recommended after age 64.  Pneumococcal polysaccharide (PPSV23) vaccine. One dose is recommended after age 41. Talk to your health care provider about which screenings and vaccines you need and how often you need them. This information is not intended to replace advice given to you by your health care provider. Make sure you discuss any questions you have with your health care provider. Document Released: 03/09/2015 Document Revised: 10/31/2015 Document Reviewed: 12/12/2014 Elsevier Interactive Patient Education  2017 [place] Prevention in the Home Falls can cause injuries. They can happen to people of all ages. There are many things you can do to make your home safe and to help prevent falls. What can I do on the outside of my home?  Regularly fix the edges of walkways and driveways and fix any cracks.  Remove anything that might make you trip as you walk through a door, such as a raised step or threshold.  Trim any bushes or trees on the path to your home.  Use bright outdoor lighting.  Clear any walking paths of anything that might make someone trip, such as rocks or tools.  Regularly check to see if handrails are loose or broken. Make sure that both sides of any steps have handrails.  Any raised decks and porches should have guardrails on the edges.  Have any leaves, snow, or ice cleared regularly.  Use sand or salt on walking paths during winter.  Clean up any spills in your garage right away. This includes oil or grease spills. What can I do in the bathroom?  Use night lights.  Install grab bars by the  toilet and in the tub and shower. Do not use towel bars as grab bars.  Use non-skid mats or decals in the tub or shower.  If you need to sit down in the shower, use a plastic, non-slip stool.  Keep the floor dry. Clean up any water that spills on the floor as soon as it happens.  Remove soap buildup in the tub or shower regularly.  Attach bath mats securely with double-sided non-slip rug tape.  Do not have throw rugs and other things on the floor that can make you trip. What can I do in the bedroom?  Use night lights.  Make sure that you have a light by your bed that is easy to reach.  Do not use any sheets or blankets that are too big for your bed. They should not hang down onto the floor.  Have a firm chair that has side arms. You can use this for support while you get dressed.  Do not have throw rugs and other things on the floor that can make you trip. What can I do in the kitchen?  Clean up any spills right away.  Avoid walking on wet floors.  Keep items  that you use a lot in easy-to-reach places.  If you need to reach something above you, use a strong step stool that has a grab bar.  Keep electrical cords out of the way.  Do not use floor polish or wax that makes floors slippery. If you must use wax, use non-skid floor wax.  Do not have throw rugs and other things on the floor that can make you trip. What can I do with my stairs?  Do not leave any items on the stairs.  Make sure that there are handrails on both sides of the stairs and use them. Fix handrails that are broken or loose. Make sure that handrails are as long as the stairways.  Check any carpeting to make sure that it is firmly attached to the stairs. Fix any carpet that is loose or worn.  Avoid having throw rugs at the top or bottom of the stairs. If you do have throw rugs, attach them to the floor with carpet tape.  Make sure that you have a light switch at the top of the stairs and the bottom of  the stairs. If you do not have them, ask someone to add them for you. What else can I do to help prevent falls?  Wear shoes that:  Do not have high heels.  Have rubber bottoms.  Are comfortable and fit you well.  Are closed at the toe. Do not wear sandals.  If you use a stepladder:  Make sure that it is fully opened. Do not climb a closed stepladder.  Make sure that both sides of the stepladder are locked into place.  Ask someone to hold it for you, if possible.  Clearly mark and make sure that you can see:  Any grab bars or handrails.  First and last steps.  Where the edge of each step is.  Use tools that help you move around (mobility aids) if they are needed. These include:  Canes.  Walkers.  Scooters.  Crutches.  Turn on the lights when you go into a dark area. Replace any light bulbs as soon as they burn out.  Set up your furniture so you have a clear path. Avoid moving your furniture around.  If any of your floors are uneven, fix them.  If there are any pets around you, be aware of where they are.  Review your medicines with your doctor. Some medicines can make you feel dizzy. This can increase your chance of falling. Ask your doctor what other things that you can do to help prevent falls. This information is not intended to replace advice given to you by your health care provider. Make sure you discuss any questions you have with your health care provider. Document Released: 12/07/2008 Document Revised: 07/19/2015 Document Reviewed: 03/17/2014 Elsevier Interactive Patient Education  2017 [employer].

## 2019-07-06 ENCOUNTER — Telehealth: Payer: Self-pay | Admitting: Physician Assistant

## 2019-07-06 NOTE — Telephone Encounter (Signed)
Please schedule office visit to discuss, likely will need to update labs to make sure we rule out certain causes.

## 2019-07-06 NOTE — Telephone Encounter (Signed)
Spoke to Edwin Torres told her Edwin Torres said he needs an appt to evaluate itching. Edwin Torres verbalized understanding. Appt scheduled for 5/19 at 8:30 with [facility]. Edwin Torres verbalized understanding.

## 2019-07-06 NOTE — Telephone Encounter (Signed)
Patient's wife is calling in this afternoon, asking if Edwin Torres could send a different prescription for [name]'s itching issue, states they have been prescribed other medications but nothing has helped, so [name] wanted to know if they can try something else, still no rash or spots on skin.

## 2019-07-06 NOTE — Telephone Encounter (Signed)
Please see message and advise 

## 2019-07-12 ENCOUNTER — Encounter: Payer: Self-pay | Admitting: Physician Assistant

## 2019-07-12 ENCOUNTER — Other Ambulatory Visit: Payer: Self-pay

## 2019-07-12 ENCOUNTER — Ambulatory Visit (INDEPENDENT_AMBULATORY_CARE_PROVIDER_SITE_OTHER): Payer: PPO

## 2019-07-12 ENCOUNTER — Ambulatory Visit (INDEPENDENT_AMBULATORY_CARE_PROVIDER_SITE_OTHER): Payer: PPO | Admitting: Physician Assistant

## 2019-07-12 VITALS — BP 138/78 | HR 111 | Temp 98.4°F | Ht 63.5 in | Wt 134.0 lb

## 2019-07-12 DIAGNOSIS — R739 Hyperglycemia, unspecified: Secondary | ICD-10-CM | POA: Diagnosis not present

## 2019-07-12 DIAGNOSIS — L299 Pruritus, unspecified: Secondary | ICD-10-CM | POA: Diagnosis not present

## 2019-07-12 DIAGNOSIS — S2231XA Fracture of one rib, right side, initial encounter for closed fracture: Secondary | ICD-10-CM | POA: Diagnosis not present

## 2019-07-12 LAB — COMPREHENSIVE METABOLIC PANEL
ALT: 7 U/L (ref 0–53)
AST: 12 U/L (ref 0–37)
Albumin: 4 g/dL (ref 3.5–5.2)
Alkaline Phosphatase: 122 U/L — ABNORMAL HIGH (ref 39–117)
BUN: 5 mg/dL — ABNORMAL LOW (ref 6–23)
CO2: 25 mEq/L (ref 19–32)
Calcium: 9.2 mg/dL (ref 8.4–10.5)
Chloride: 103 mEq/L (ref 96–112)
Creatinine, Ser: 0.61 mg/dL (ref 0.40–1.50)
GFR: 132.55 mL/min (ref >60.00)
Glucose, Bld: 189 mg/dL — ABNORMAL HIGH (ref 70–99)
Potassium: 4.4 mEq/L (ref 3.5–5.1)
Sodium: 136 mEq/L (ref 135–145)
Total Bilirubin: 0.5 mg/dL (ref 0.2–1.2)
Total Protein: 6.5 g/dL (ref 6.0–8.3)

## 2019-07-12 LAB — CBC WITH DIFFERENTIAL/PLATELET
Basophils Absolute: 0.1 10*3/uL (ref 0.0–0.1)
Basophils Relative: 0.9 % (ref 0.0–3.0)
Eosinophils Absolute: 0.3 10*3/uL (ref 0.0–0.7)
Eosinophils Relative: 4.6 % (ref 0.0–5.0)
HCT: 37.9 % — ABNORMAL LOW (ref 39.0–52.0)
Hemoglobin: 12.7 g/dL — ABNORMAL LOW (ref 13.0–17.0)
Lymphocytes Relative: 32.6 % (ref 12.0–46.0)
Lymphs Abs: 2.4 10*3/uL (ref 0.7–4.0)
MCHC: 33.6 g/dL (ref 30.0–36.0)
MCV: 81.9 fl (ref 78.0–100.0)
Monocytes Absolute: 0.8 10*3/uL (ref 0.1–1.0)
Monocytes Relative: 10.7 % (ref 3.0–12.0)
Neutro Abs: 3.7 10*3/uL (ref 1.4–7.7)
Neutrophils Relative %: 51.2 % (ref 43.0–77.0)
Platelets: 342 10*3/uL (ref 150.0–400.0)
RBC: 4.63 Mil/uL (ref 4.22–5.81)
RDW: 19.8 % — ABNORMAL HIGH (ref 11.5–15.5)
WBC: 7.2 10*3/uL (ref 4.0–10.5)

## 2019-07-12 LAB — TSH: TSH: 4.19 u[IU]/mL (ref 0.35–4.50)

## 2019-07-12 NOTE — Progress Notes (Signed)
Edwin Torres is a 64 y.o. male here for a new problem.  I acted as a Education administrator for [employer], PA-C [employer], [place]  History of Present Illness:   Chief Complaint  Patient presents with  . Itching of skin    HPI  Itching of skin Patient has had itching of his skin for a "long time". Taking Benadryl which does not help.  We also prescribed Atarax at his last visit, but he has not found any relief with this.  He states that his symptoms are worsening with time.  Symptoms are worse at night and are intermittent.  Overall he states that this is throughout his entire body but often more present in his upper body rather than his lower body.  Uses regular lotion and Vaseline for hydration without improvement of symptoms.  He has been completely sober from alcohol since he has last seen me.  He has had about 10 pound weight gain due to indulging in sweets.  Denies any visible rash.  States that his itch is internal and is not improved with scratching.  He continues to smoke and is currently smoking about 1 pack/day.   Wt Readings from Last 4 Encounters:  07/12/19 134 lb (60.8 kg)  04/04/19 125 lb 6.1 oz (56.9 kg)  03/10/19 116 lb 9.6 oz (52.9 kg)  03/08/19 116 lb 9.6 oz (52.9 kg)      Past Medical History:  Diagnosis Date  . Alcohol addiction ([place])   . Allergy   . Cancer ([place])    Melanoma on face was removed  . Depression   . Herpes genitalis in men   . History of chicken pox   . Seizures ([place])      Social History   Socioeconomic History  . Marital status: Married    Spouse name: Not on file  . Number of children: Not on file  . Years of education: Not on file  . Highest education level: Not on file  Occupational History  . Not on file  Tobacco Use  . Smoking status: Current Every Day Smoker    Packs/day: 1.00  . Smokeless tobacco: Current User  Substance and Sexual Activity  . Alcohol use: Yes    Alcohol/week: 8.0 standard drinks    Types: 8 Cans of beer per  week    Comment: daily  . Drug use: No  . Sexual activity: Yes    Partners: Female    Birth control/protection: None  Other Topics Concern  . Not on file  Social History Narrative   2012 fell from roof, disabled since   Social Determinants of Health   Financial Resource Strain:   . Difficulty of Paying Living Expenses:   Food Insecurity:   . Worried About Charity fundraiser in the Last Year:   . Arboriculturist in the Last Year:   Transportation Needs:   . Film/video editor (Medical):   Marland Kitchen Lack of Transportation (Non-Medical):   Physical Activity:   . Days of Exercise per Week:   . Minutes of Exercise per Session:   Stress:   . Feeling of Stress :   Social Connections:   . Frequency of Communication with Friends and Family:   . Frequency of Social Gatherings with Friends and Family:   . Attends Religious Services:   . Active Member of Clubs or Organizations:   . Attends Archivist Meetings:   Marland Kitchen Marital Status:   Intimate Partner Violence:   .  Fear of Current or Ex-Partner:   . Emotionally Abused:   Marland Kitchen Physically Abused:   . Sexually Abused:     Past Surgical History:  Procedure Laterality Date  . FOOT FRACTURE SURGERY Bilateral   . NOSE SURGERY    . VASECTOMY      Family History  Problem Relation Age of Onset  . Cancer Mother        bladder  . Diabetes Mother   . Arthritis Mother   . Hyperlipidemia Father   . Alcohol abuse Father   . Arthritis Sister     Allergies  Allergen Reactions  . Penicillins Anaphylaxis    Has patient had a PCN reaction causing immediate rash, facial/tongue/throat swelling, SOB or lightheadedness with hypotension: Yes Has patient had a PCN reaction causing severe rash involving mucus membranes or skin necrosis: No Has patient had a PCN reaction that required hospitalization: Yes Has patient had a PCN reaction occurring within the last 10 years: No If all of the above answers are "NO", then may proceed with  Cephalosporin use.   . Ativan [Lorazepam] Other (See Comments)    Made him crazy    Current Medications:   Current Outpatient Medications:  .  diphenhydrAMINE (BENADRYL ALLERGY) 25 mg capsule, Take 25 mg by mouth every 6 (six) hours as needed., Disp: , Rfl:  .  feeding supplement, ENSURE ENLIVE, (ENSURE ENLIVE) LIQD, Take 237 mLs by mouth 2 (two) times daily between meals., Disp: , Rfl:  .  ibuprofen (ADVIL) 200 MG tablet, Take 600 mg by mouth every 6 (six) hours as needed., Disp: , Rfl:  .  folic acid (FOLVITE) 1 MG tablet, Take 1 tablet (1 mg total) by mouth daily. (Patient not taking: Reported on 07/12/2019), Disp: 28 tablet, Rfl: 0 .  hydrOXYzine (ATARAX/VISTARIL) 25 MG tablet, Take 1 tablet (25 mg total) by mouth 2 (two) times daily as needed for itching. (Patient not taking: Reported on 07/12/2019), Disp: 30 tablet, Rfl: 0 .  magnesium oxide (MAG-OX) 400 (241.3 Mg) MG tablet, Take 1 tablet (400 mg total) by mouth 2 (two) times daily. (Patient not taking: Reported on 07/12/2019), Disp: 56 tablet, Rfl: 0 .  thiamine 100 MG tablet, Take 100 mg by mouth daily., Disp: , Rfl:    Review of Systems:   ROS Negative unless otherwise specified per HPI.  Vitals:   Vitals:   07/12/19 0824  BP: 138/78  Pulse: (!) 111  Temp: 98.4 F (36.9 C)  TempSrc: Temporal  SpO2: 98%  Weight: 134 lb (60.8 kg)  Height: 5' 3.5" (1.613 m)     Body mass index is 23.36 kg/m.  Physical Exam:   Physical Exam Vitals and nursing note reviewed.  Constitutional:      General: He is not in acute distress.    Appearance: He is well-developed. He is not ill-appearing or toxic-appearing.  Cardiovascular:     Rate and Rhythm: Normal rate and regular rhythm.     Pulses: Normal pulses.     Heart sounds: Normal heart sounds, S1 normal and S2 normal.     Comments: No LE edema Pulmonary:     Effort: Pulmonary effort is normal.     Breath sounds: Normal breath sounds.  Skin:    General: Skin is warm and dry.   Neurological:     Mental Status: He is alert.     GCS: GCS eye subscore is 4. GCS verbal subscore is 5. GCS motor subscore is 6.  Psychiatric:  Speech: Speech normal.        Behavior: Behavior normal. Behavior is cooperative.      Assessment and Plan:   Imraan was seen today for itching of skin.  Diagnoses and all orders for this visit:  Chronic pruritus -     CBC with Differential/Platelet -     Comprehensive metabolic panel -     TSH -     DG Chest 2 View; Future   Will obtain updated labs to assess for any possible causes of his chronic pruritus.  Also will obtain a chest x-ray to check for adenopathy.  If all imaging and labs are normal, will trial amitriptyline to see if this helps with his itching.   . Reviewed expectations re: course of current medical issues. . Discussed self-management of symptoms. . Outlined signs and symptoms indicating need for more acute intervention. . Patient verbalized understanding and all questions were answered. . See orders for this visit as documented in the electronic medical record. . Patient received an After-Visit Summary.  CMA or LPN served as scribe during this visit. History, Physical, and Plan performed by medical provider. The above documentation has been reviewed and is accurate and complete.  Inda Coke, PA-C

## 2019-07-12 NOTE — Patient Instructions (Signed)
It was great to see you!  I will be in touch with your lab and xray results, as well as the plan!  Take care,  Inda Coke PA-C

## 2019-07-13 ENCOUNTER — Other Ambulatory Visit: Payer: Self-pay | Admitting: Physician Assistant

## 2019-07-13 ENCOUNTER — Other Ambulatory Visit (INDEPENDENT_AMBULATORY_CARE_PROVIDER_SITE_OTHER): Payer: PPO

## 2019-07-13 DIAGNOSIS — R739 Hyperglycemia, unspecified: Secondary | ICD-10-CM

## 2019-07-13 LAB — HEMOGLOBIN A1C: Hgb A1c MFr Bld: 6.5 % (ref 4.6–6.5)

## 2019-07-13 MED ORDER — AMITRIPTYLINE HCL 10 MG PO TABS: 10.0000 mg | ORAL_TABLET | Freq: Every day | ORAL | 1 refills | Status: DC

## 2019-07-13 NOTE — Addendum Note (Signed)
Addended by: Francis Dowse T on: 07/13/2019 08:03 AM   Modules accepted: Orders

## 2019-07-14 NOTE — Progress Notes (Signed)
Called pt and scheduled 1 month f/u and 6 month recheck

## 2019-07-20 ENCOUNTER — Other Ambulatory Visit: Payer: Self-pay

## 2019-07-20 DIAGNOSIS — F10129 Alcohol abuse with intoxication, unspecified: Secondary | ICD-10-CM

## 2019-07-22 ENCOUNTER — Other Ambulatory Visit: Payer: Self-pay

## 2019-07-26 ENCOUNTER — Other Ambulatory Visit: Payer: Self-pay

## 2019-07-26 ENCOUNTER — Other Ambulatory Visit (INDEPENDENT_AMBULATORY_CARE_PROVIDER_SITE_OTHER): Payer: PPO

## 2019-07-26 DIAGNOSIS — F10129 Alcohol abuse with intoxication, unspecified: Secondary | ICD-10-CM

## 2019-07-26 LAB — COMPREHENSIVE METABOLIC PANEL
ALT: 8 U/L (ref 0–53)
AST: 13 U/L (ref 0–37)
Albumin: 4 g/dL (ref 3.5–5.2)
Alkaline Phosphatase: 118 U/L — ABNORMAL HIGH (ref 39–117)
BUN: 4 mg/dL — ABNORMAL LOW (ref 6–23)
CO2: 27 mEq/L (ref 19–32)
Calcium: 9.3 mg/dL (ref 8.4–10.5)
Chloride: 103 mEq/L (ref 96–112)
Creatinine, Ser: 0.57 mg/dL (ref 0.40–1.50)
GFR: 143.32 mL/min (ref >60.00)
Glucose, Bld: 134 mg/dL — ABNORMAL HIGH (ref 70–99)
Potassium: 4.1 mEq/L (ref 3.5–5.1)
Sodium: 135 mEq/L (ref 135–145)
Total Bilirubin: 0.4 mg/dL (ref 0.2–1.2)
Total Protein: 6.5 g/dL (ref 6.0–8.3)

## 2019-08-19 ENCOUNTER — Ambulatory Visit (INDEPENDENT_AMBULATORY_CARE_PROVIDER_SITE_OTHER): Payer: PPO | Admitting: Physician Assistant

## 2019-08-19 ENCOUNTER — Other Ambulatory Visit: Payer: Self-pay | Admitting: Physician Assistant

## 2019-08-19 ENCOUNTER — Ambulatory Visit (HOSPITAL_COMMUNITY)
Admission: RE | Admit: 2019-08-19 | Discharge: 2019-08-19 | Disposition: A | Discharge status: Home / Self Care | Payer: PPO | Source: Ambulatory Visit | Attending: Physician Assistant | Admitting: Physician Assistant

## 2019-08-19 ENCOUNTER — Encounter: Payer: Self-pay | Admitting: Physician Assistant

## 2019-08-19 ENCOUNTER — Other Ambulatory Visit: Payer: Self-pay

## 2019-08-19 VITALS — BP 126/70 | HR 100 | Temp 98.0°F | Ht 63.5 in | Wt 131.4 lb

## 2019-08-19 DIAGNOSIS — L299 Pruritus, unspecified: Secondary | ICD-10-CM | POA: Diagnosis not present

## 2019-08-19 DIAGNOSIS — R710 Precipitous drop in hematocrit: Secondary | ICD-10-CM

## 2019-08-19 DIAGNOSIS — H02401 Unspecified ptosis of right eyelid: Secondary | ICD-10-CM

## 2019-08-19 DIAGNOSIS — R29818 Other symptoms and signs involving the nervous system: Secondary | ICD-10-CM | POA: Diagnosis not present

## 2019-08-19 DIAGNOSIS — F17200 Nicotine dependence, unspecified, uncomplicated: Secondary | ICD-10-CM | POA: Diagnosis not present

## 2019-08-19 LAB — CBC WITH DIFFERENTIAL/PLATELET
Basophils Absolute: 0.1 10*3/uL (ref 0.0–0.1)
Basophils Relative: 1.1 % (ref 0.0–3.0)
Eosinophils Absolute: 0.3 10*3/uL (ref 0.0–0.7)
Eosinophils Relative: 2.3 % (ref 0.0–5.0)
HCT: 36.5 % — ABNORMAL LOW (ref 39.0–52.0)
Hemoglobin: 12.1 g/dL — ABNORMAL LOW (ref 13.0–17.0)
Lymphocytes Relative: 24.1 % (ref 12.0–46.0)
Lymphs Abs: 2.6 10*3/uL (ref 0.7–4.0)
MCHC: 33.2 g/dL (ref 30.0–36.0)
MCV: 84.3 fl (ref 78.0–100.0)
Monocytes Absolute: 1 10*3/uL (ref 0.1–1.0)
Monocytes Relative: 9.4 % (ref 3.0–12.0)
Neutro Abs: 6.9 10*3/uL (ref 1.4–7.7)
Neutrophils Relative %: 63.1 % (ref 43.0–77.0)
Platelets: 706 10*3/uL — ABNORMAL HIGH (ref 150.0–400.0)
RBC: 4.32 Mil/uL (ref 4.22–5.81)
RDW: 17.3 % — ABNORMAL HIGH (ref 11.5–15.5)
WBC: 10.9 10*3/uL — ABNORMAL HIGH (ref 4.0–10.5)

## 2019-08-19 LAB — COMPREHENSIVE METABOLIC PANEL
ALT: 6 U/L (ref 0–53)
AST: 9 U/L (ref 0–37)
Albumin: 3.5 g/dL (ref 3.5–5.2)
Alkaline Phosphatase: 100 U/L (ref 39–117)
BUN: 3 mg/dL — ABNORMAL LOW (ref 6–23)
CO2: 26 mEq/L (ref 19–32)
Calcium: 9.2 mg/dL (ref 8.4–10.5)
Chloride: 100 mEq/L (ref 96–112)
Creatinine, Ser: 0.56 mg/dL (ref 0.40–1.50)
GFR: 146.25 mL/min (ref >60.00)
Glucose, Bld: 149 mg/dL — ABNORMAL HIGH (ref 70–99)
Potassium: 3.7 mEq/L (ref 3.5–5.1)
Sodium: 138 mEq/L (ref 135–145)
Total Bilirubin: 0.5 mg/dL (ref 0.2–1.2)
Total Protein: 6.5 g/dL (ref 6.0–8.3)

## 2019-08-19 LAB — TSH: TSH: 3.43 u[IU]/mL (ref 0.35–4.50)

## 2019-08-19 LAB — LIPID PANEL
Cholesterol: 146 mg/dL (ref 0–200)
HDL: 20.1 mg/dL — ABNORMAL LOW (ref >39.00)
LDL Cholesterol: 102 mg/dL — ABNORMAL HIGH (ref 0–99)
NonHDL: 126.11
Total CHOL/HDL Ratio: 7
Triglycerides: 121 mg/dL (ref 0.0–149.0)
VLDL: 24.2 mg/dL (ref 0.0–40.0)

## 2019-08-19 MED ORDER — GABAPENTIN 100 MG PO CAPS: ORAL_CAPSULE | ORAL | 1 refills | Status: DC

## 2019-08-19 NOTE — Progress Notes (Signed)
Edwin Torres is a 64 y.o. male is here for follow up.  I acted as a Education administrator for [employer], PA-C Anselmo Pickler, LPN   History of Present Illness:   Chief Complaint  Patient presents with  . Pruritis    HPI   Itching Pt till c/o persistent itching all over no improvement.  Pt completed the Rx of Elavil. He even doubled this for a few days without significant changes in symptoms. Denies any changes to symptoms.  R eye droop In Jan 2018 had ptosis of R eyelid that brought him to the ER. Had negative CT and MRI, was dx with third CN palsy. Was recommended to go to neurology. He saw eye doctor Katy Fitch) who noted that patient had for asymmetric pupils and sluggish reaction on right, ptosis, and gaze palsy with upgaze/downgaze/adduction. He then saw Dr. Ellouise Newer a few days later who obtained labs for myasthenia gravis and HgbA1c -- both were normal. He followed up with neurology two months later and he was told the following:  "He presents today with an improvement in symptoms, ptosis is better, his vision is still blurred but better as well, no diplopia. Bloodwork normal. Etiology of third nerve palsy unclear, diabetic third nerve palsy is considered, however his HbA1c is normal. It is likely still ischemic, continue to monitor vascular risk factors."  His eye drooping has returned over the past few weeks. Denies any inciting event. He is still a smoker. He has stopped drinking. His A1c is now 6.5 (was 5.5.) . Denies changes in vision, slurred speech.    Health Maintenance Due  Topic Date Due  . COVID-19 Vaccine (1) Never done    Past Medical History:  Diagnosis Date  . Alcohol addiction ([place])   . Allergy   . Cancer ([place])    Melanoma on face was removed  . Depression   . Herpes genitalis in men   . History of chicken pox   . Seizures ([place])      Social History   Tobacco Use  . Smoking status: Current Every Day Smoker    Packs/day: 1.00  . Smokeless tobacco:  Current User  Substance Use Topics  . Alcohol use: Not Currently  . Drug use: No    Past Surgical History:  Procedure Laterality Date  . FOOT FRACTURE SURGERY Bilateral   . NOSE SURGERY    . VASECTOMY      Family History  Problem Relation Age of Onset  . Cancer Mother        bladder  . Diabetes Mother   . Arthritis Mother   . Hyperlipidemia Father   . Alcohol abuse Father   . Arthritis Sister     PMHx, SurgHx, SocialHx, FamHx, Medications, and Allergies were reviewed in the Visit Navigator and updated as appropriate.   Patient Active Problem List   Diagnosis Date Noted  . Patient non-compliant, refuses any preventative care measures 04/04/2019  . Hypomagnesemia 03/10/2019  . Dysphagia 03/04/2019  . Moderate Neurocognitive deficits 03/04/2019  . Hypokalemia 02/24/2019  . Somnolence, daytime 02/24/2019  . Alcohol abuse with intoxication ([place]) 02/22/2019  . Frequent falls 02/22/2019  . Macrocytic anemia 02/22/2019  . Tobacco abuse 02/22/2019  . Physical deconditioning 02/22/2019  . Thrombocytopenia ([place]) 02/22/2019  . Weakness of right third cranial nerve 03/21/2016    Social History   Tobacco Use  . Smoking status: Current Every Day Smoker    Packs/day: 1.00  . Smokeless tobacco: Current User  Substance  Use Topics  . Alcohol use: Not Currently  . Drug use: No    Current Medications and Allergies:    Current Outpatient Medications:  .  amitriptyline (ELAVIL) 10 MG tablet, Take 1 tablet (10 mg total) by mouth at bedtime., Disp: 30 tablet, Rfl: 1 .  diphenhydrAMINE (BENADRYL ALLERGY) 25 mg capsule, Take 25 mg by mouth every 6 (six) hours as needed., Disp: , Rfl:  .  feeding supplement, ENSURE ENLIVE, (ENSURE ENLIVE) LIQD, Take 237 mLs by mouth 2 (two) times daily between meals., Disp: , Rfl:  .  ibuprofen (ADVIL) 200 MG tablet, Take 600 mg by mouth every 6 (six) hours as needed., Disp: , Rfl:  .  gabapentin (NEURONTIN) 100 MG capsule, Take 1 capsule at  bedtime. May increase as tolerated by 100 mg to a maximum of 300 mg nightly., Disp: 90 capsule, Rfl: 1   Allergies  Allergen Reactions  . Penicillins Anaphylaxis    Has patient had a PCN reaction causing immediate rash, facial/tongue/throat swelling, SOB or lightheadedness with hypotension: Yes Has patient had a PCN reaction causing severe rash involving mucus membranes or skin necrosis: No Has patient had a PCN reaction that required hospitalization: Yes Has patient had a PCN reaction occurring within the last 10 years: No If all of the above answers are "NO", then may proceed with Cephalosporin use.   . Ativan [Lorazepam] Other (See Comments)    Made him crazy    Review of Systems   ROS  Negative unless otherwise specified per HPI.  Vitals:   Vitals:   08/19/19 1034  BP: 126/70  Pulse: 100  Temp: 98 F (36.7 C)  TempSrc: Temporal  SpO2: 97%  Weight: 131 lb 6.1 oz (59.6 kg)  Height: 5' 3.5" (1.613 m)     Body mass index is 22.91 kg/m.   Physical Exam:    Physical Exam Vitals and nursing note reviewed.  Constitutional:      General: He is not in acute distress.    Appearance: He is well-developed. He is not ill-appearing or toxic-appearing.  Cardiovascular:     Rate and Rhythm: Normal rate and regular rhythm.     Pulses: Normal pulses.     Heart sounds: Normal heart sounds, S1 normal and S2 normal.     Comments: No LE edema Pulmonary:     Effort: Pulmonary effort is normal.     Breath sounds: Normal breath sounds.  Skin:    General: Skin is warm and dry.  Neurological:     Mental Status: He is alert.     GCS: GCS eye subscore is 4. GCS verbal subscore is 5. GCS motor subscore is 6.     Sensory: Sensation is intact.     Motor: Motor function is intact.     Comments: R pupil with sluggish reaction compared to L Ptosis of R eye lid noted  Normal sensation to b/l areas of forehead, maxillary region and jaw  Symmetric smile  Psychiatric:        Speech:  Speech normal.        Behavior: Behavior normal. Behavior is cooperative.      Assessment and Plan:    Elgar was seen today for pruritis.  Diagnoses and all orders for this visit:  Ptosis of right eyelid; Other symptoms and signs involving the nervous system Per chart review, symptoms are consistent with episode in 2018, however will obtain CT scan to r/o acute stroke, as he is still a heavy smoker  and has risk factors (updating lipid panel today), and now with A1c of 6.5%. CT scan scheduled. Was told that if he develops any worsening of symptoms, needs to go to the ER. If scan normal, will refer back to neurology if possible. -     CBC with Differential/Platelet -     Comprehensive metabolic panel -     TSH       -     CT Head Wo Contrast; Future  Smoker Update lipid panel. Encouraged cessation. -     Lipid panel  Chronic pruritus Didn't have results with elavil or an increased dose of elavil. He has ran out of this medication. Will trial oral gabapentin and have patient reach out if he is unsuccessful with this, otherwise will follow-up in 2 months.  Other orders -     gabapentin (NEURONTIN) 100 MG capsule; Take 1 capsule at bedtime. May increase as tolerated by 100 mg to a maximum of 300 mg nightly.  . Reviewed expectations re: course of current medical issues. . Discussed self-management of symptoms. . Outlined signs and symptoms indicating need for more acute intervention. . Patient verbalized understanding and all questions were answered. . See orders for this visit as documented in the electronic medical record. . Patient received an After Visit Summary.  CMA or LPN served as scribe during this visit. History, Physical, and Plan performed by medical provider. The above documentation has been reviewed and is accurate and complete.  Inda Coke, PA-C [place], [place] 08/19/2019  Follow-up: No follow-ups on file.

## 2019-08-19 NOTE — Patient Instructions (Addendum)
It was great to see you!  I will be in touch with your CT results.  Please follow-up in 2 months so we can re-check your blood sugar levels.  Please start gabapentin medication for your itching -- may start 100 mg daily and increase as tolerated to 300 mg daily for itching. Let me know how this is doing after about two weeks.  Take care,  Inda Coke PA-C

## 2019-08-22 ENCOUNTER — Encounter: Payer: Self-pay | Admitting: Neurology

## 2019-08-22 ENCOUNTER — Other Ambulatory Visit: Payer: Self-pay | Admitting: Physician Assistant

## 2019-08-22 DIAGNOSIS — R710 Precipitous drop in hematocrit: Secondary | ICD-10-CM

## 2019-08-24 NOTE — Addendum Note (Signed)
Addended by: Betti Cruz on: 08/24/2019 04:00 PM   Modules accepted: Orders

## 2019-09-07 ENCOUNTER — Other Ambulatory Visit: Payer: Self-pay

## 2019-09-07 ENCOUNTER — Telehealth: Payer: Self-pay

## 2019-09-07 ENCOUNTER — Other Ambulatory Visit (INDEPENDENT_AMBULATORY_CARE_PROVIDER_SITE_OTHER): Payer: PPO

## 2019-09-07 ENCOUNTER — Telehealth: Payer: Self-pay | Admitting: Physician Assistant

## 2019-09-07 DIAGNOSIS — R710 Precipitous drop in hematocrit: Secondary | ICD-10-CM | POA: Diagnosis not present

## 2019-09-07 LAB — CBC WITH DIFFERENTIAL/PLATELET
Basophils Absolute: 0.3 10*3/uL — ABNORMAL HIGH (ref 0.0–0.1)
Basophils Relative: 1.2 % (ref 0.0–3.0)
Eosinophils Absolute: 0.3 10*3/uL (ref 0.0–0.7)
Eosinophils Relative: 1.1 % (ref 0.0–5.0)
HCT: 35.8 % — ABNORMAL LOW (ref 39.0–52.0)
Hemoglobin: 12 g/dL — ABNORMAL LOW (ref 13.0–17.0)
Lymphocytes Relative: 10.6 % — ABNORMAL LOW (ref 12.0–46.0)
Lymphs Abs: 2.4 10*3/uL (ref 0.7–4.0)
MCHC: 33.5 g/dL (ref 30.0–36.0)
MCV: 82.4 fl (ref 78.0–100.0)
Monocytes Absolute: 2.1 10*3/uL — ABNORMAL HIGH (ref 0.1–1.0)
Monocytes Relative: 9.1 % (ref 3.0–12.0)
Neutro Abs: 17.9 10*3/uL — ABNORMAL HIGH (ref 1.4–7.7)
Neutrophils Relative %: 78 % — ABNORMAL HIGH (ref 43.0–77.0)
Platelets: 629 10*3/uL — ABNORMAL HIGH (ref 150.0–400.0)
RBC: 4.34 Mil/uL (ref 4.22–5.81)
RDW: 16.5 % — ABNORMAL HIGH (ref 11.5–15.5)
WBC: 23 10*3/uL (ref 4.0–10.5)

## 2019-09-07 NOTE — Telephone Encounter (Signed)
Pt and wife aware see other message.

## 2019-09-07 NOTE — Telephone Encounter (Signed)
Please call patient and see what's going on. His infection count is VERY high. Is having any symptoms? Are his vitals ok?

## 2019-09-07 NOTE — Telephone Encounter (Signed)
Spoke with pt's wife, she states that he is coughing a lot and is more tired. She states that he is up for most of the night so it is hard to tell if it is from that. She does not check his vitals.

## 2019-09-07 NOTE — Telephone Encounter (Signed)
LVM for patient's wife to return call. 

## 2019-09-07 NOTE — Telephone Encounter (Signed)
I strongly recommend that patient go to urgent care for evaluation of cough --- he might have pneumonia or other serious illness brewing.

## 2019-09-07 NOTE — Telephone Encounter (Signed)
Pt wife called stating she took pts temperature - he does NOT have a fever.

## 2019-09-07 NOTE — Telephone Encounter (Signed)
Spoke with patient's wife.

## 2019-09-07 NOTE — Telephone Encounter (Signed)
Left message on voicemail to call office.  

## 2019-09-07 NOTE — Telephone Encounter (Signed)
Pt.s wife is wanting to know why he needs to leave another stool sample

## 2019-09-07 NOTE — Telephone Encounter (Signed)
Spoke to [place] pt's wife, she said pt says throat is a little sore, non-productive cough, rib pain. Denies fever, urinary symptoms. He is having some diarrhea once a day. Cy Blamer she needs to take him to Urgent care to be evaluated per [facility], so they can do an x-ray which we do not have available here at this time. [name] verbalized understanding and told pt I could hear him in the back ground and she said he said no. Told her to please try to get him to go due to high white count which means infection he could possibly have pneumonia. [name] said she will try to get him to go.

## 2019-09-07 NOTE — Telephone Encounter (Signed)
Critical WBC @ 23.0

## 2019-09-08 ENCOUNTER — Inpatient Hospital Stay (HOSPITAL_COMMUNITY): Payer: PPO

## 2019-09-08 ENCOUNTER — Emergency Department (HOSPITAL_COMMUNITY): Payer: PPO

## 2019-09-08 ENCOUNTER — Inpatient Hospital Stay (HOSPITAL_COMMUNITY)
Admission: EM | Admit: 2019-09-08 | Discharge: 2019-09-22 | DRG: 871 | Disposition: A | Discharge status: Home / Self Care | Payer: PPO | Attending: Family Medicine | Admitting: Family Medicine

## 2019-09-08 ENCOUNTER — Other Ambulatory Visit: Payer: Self-pay

## 2019-09-08 ENCOUNTER — Encounter (HOSPITAL_COMMUNITY): Payer: Self-pay | Admitting: Pulmonary Disease

## 2019-09-08 DIAGNOSIS — J90 Pleural effusion, not elsewhere classified: Secondary | ICD-10-CM | POA: Diagnosis present

## 2019-09-08 DIAGNOSIS — A419 Sepsis, unspecified organism: Secondary | ICD-10-CM | POA: Diagnosis not present

## 2019-09-08 DIAGNOSIS — J80 Acute respiratory distress syndrome: Secondary | ICD-10-CM | POA: Diagnosis not present

## 2019-09-08 DIAGNOSIS — A6002 Herpesviral infection of other male genital organs: Secondary | ICD-10-CM | POA: Diagnosis not present

## 2019-09-08 DIAGNOSIS — R000 Tachycardia, unspecified: Secondary | ICD-10-CM | POA: Diagnosis not present

## 2019-09-08 DIAGNOSIS — Z6820 Body mass index (BMI) 20.0-20.9, adult: Secondary | ICD-10-CM

## 2019-09-08 DIAGNOSIS — J9601 Acute respiratory failure with hypoxia: Secondary | ICD-10-CM | POA: Diagnosis present

## 2019-09-08 DIAGNOSIS — J189 Pneumonia, unspecified organism: Secondary | ICD-10-CM | POA: Diagnosis present

## 2019-09-08 DIAGNOSIS — R05 Cough: Secondary | ICD-10-CM | POA: Diagnosis not present

## 2019-09-08 DIAGNOSIS — Z9852 Vasectomy status: Secondary | ICD-10-CM | POA: Diagnosis not present

## 2019-09-08 DIAGNOSIS — J984 Other disorders of lung: Secondary | ICD-10-CM | POA: Diagnosis not present

## 2019-09-08 DIAGNOSIS — Z85820 Personal history of malignant melanoma of skin: Secondary | ICD-10-CM

## 2019-09-08 DIAGNOSIS — Z9689 Presence of other specified functional implants: Secondary | ICD-10-CM | POA: Diagnosis not present

## 2019-09-08 DIAGNOSIS — D509 Iron deficiency anemia, unspecified: Secondary | ICD-10-CM | POA: Diagnosis not present

## 2019-09-08 DIAGNOSIS — R0902 Hypoxemia: Secondary | ICD-10-CM | POA: Diagnosis not present

## 2019-09-08 DIAGNOSIS — J9690 Respiratory failure, unspecified, unspecified whether with hypoxia or hypercapnia: Secondary | ICD-10-CM | POA: Diagnosis not present

## 2019-09-08 DIAGNOSIS — A408 Other streptococcal sepsis: Secondary | ICD-10-CM | POA: Diagnosis not present

## 2019-09-08 DIAGNOSIS — H02401 Unspecified ptosis of right eyelid: Secondary | ICD-10-CM | POA: Diagnosis present

## 2019-09-08 DIAGNOSIS — E162 Hypoglycemia, unspecified: Secondary | ICD-10-CM | POA: Diagnosis not present

## 2019-09-08 DIAGNOSIS — R0689 Other abnormalities of breathing: Secondary | ICD-10-CM | POA: Diagnosis not present

## 2019-09-08 DIAGNOSIS — F329 Major depressive disorder, single episode, unspecified: Secondary | ICD-10-CM | POA: Diagnosis present

## 2019-09-08 DIAGNOSIS — R569 Unspecified convulsions: Secondary | ICD-10-CM | POA: Diagnosis present

## 2019-09-08 DIAGNOSIS — J811 Chronic pulmonary edema: Secondary | ICD-10-CM | POA: Diagnosis not present

## 2019-09-08 DIAGNOSIS — J9600 Acute respiratory failure, unspecified whether with hypoxia or hypercapnia: Secondary | ICD-10-CM

## 2019-09-08 DIAGNOSIS — J9811 Atelectasis: Secondary | ICD-10-CM | POA: Diagnosis not present

## 2019-09-08 DIAGNOSIS — R918 Other nonspecific abnormal finding of lung field: Secondary | ICD-10-CM | POA: Diagnosis not present

## 2019-09-08 DIAGNOSIS — I2510 Atherosclerotic heart disease of native coronary artery without angina pectoris: Secondary | ICD-10-CM | POA: Diagnosis not present

## 2019-09-08 DIAGNOSIS — J939 Pneumothorax, unspecified: Secondary | ICD-10-CM

## 2019-09-08 DIAGNOSIS — J948 Other specified pleural conditions: Secondary | ICD-10-CM | POA: Diagnosis not present

## 2019-09-08 DIAGNOSIS — E440 Moderate protein-calorie malnutrition: Secondary | ICD-10-CM | POA: Insufficient documentation

## 2019-09-08 DIAGNOSIS — R200 Anesthesia of skin: Secondary | ICD-10-CM | POA: Diagnosis not present

## 2019-09-08 DIAGNOSIS — E876 Hypokalemia: Secondary | ICD-10-CM | POA: Diagnosis not present

## 2019-09-08 DIAGNOSIS — F17210 Nicotine dependence, cigarettes, uncomplicated: Secondary | ICD-10-CM | POA: Diagnosis not present

## 2019-09-08 DIAGNOSIS — J810 Acute pulmonary edema: Secondary | ICD-10-CM | POA: Diagnosis not present

## 2019-09-08 DIAGNOSIS — H4900 Third [oculomotor] nerve palsy, unspecified eye: Secondary | ICD-10-CM | POA: Diagnosis present

## 2019-09-08 DIAGNOSIS — E861 Hypovolemia: Secondary | ICD-10-CM | POA: Diagnosis present

## 2019-09-08 DIAGNOSIS — E1165 Type 2 diabetes mellitus with hyperglycemia: Secondary | ICD-10-CM | POA: Diagnosis not present

## 2019-09-08 DIAGNOSIS — Z79899 Other long term (current) drug therapy: Secondary | ICD-10-CM

## 2019-09-08 DIAGNOSIS — Z888 Allergy status to other drugs, medicaments and biological substances status: Secondary | ICD-10-CM

## 2019-09-08 DIAGNOSIS — F1020 Alcohol dependence, uncomplicated: Secondary | ICD-10-CM | POA: Diagnosis present

## 2019-09-08 DIAGNOSIS — J13 Pneumonia due to Streptococcus pneumoniae: Secondary | ICD-10-CM | POA: Diagnosis present

## 2019-09-08 DIAGNOSIS — J869 Pyothorax without fistula: Secondary | ICD-10-CM

## 2019-09-08 DIAGNOSIS — E871 Hypo-osmolality and hyponatremia: Secondary | ICD-10-CM | POA: Diagnosis present

## 2019-09-08 DIAGNOSIS — J181 Lobar pneumonia, unspecified organism: Secondary | ICD-10-CM | POA: Diagnosis not present

## 2019-09-08 DIAGNOSIS — R0602 Shortness of breath: Secondary | ICD-10-CM | POA: Diagnosis not present

## 2019-09-08 DIAGNOSIS — R296 Repeated falls: Secondary | ICD-10-CM | POA: Diagnosis present

## 2019-09-08 DIAGNOSIS — M4184 Other forms of scoliosis, thoracic region: Secondary | ICD-10-CM | POA: Diagnosis not present

## 2019-09-08 DIAGNOSIS — Z880 Allergy status to penicillin: Secondary | ICD-10-CM

## 2019-09-08 DIAGNOSIS — Z9889 Other specified postprocedural states: Secondary | ICD-10-CM | POA: Diagnosis present

## 2019-09-08 DIAGNOSIS — J9621 Acute and chronic respiratory failure with hypoxia: Secondary | ICD-10-CM | POA: Diagnosis present

## 2019-09-08 DIAGNOSIS — R6520 Severe sepsis without septic shock: Secondary | ICD-10-CM | POA: Diagnosis not present

## 2019-09-08 DIAGNOSIS — Z20822 Contact with and (suspected) exposure to covid-19: Secondary | ICD-10-CM | POA: Diagnosis not present

## 2019-09-08 DIAGNOSIS — J9611 Chronic respiratory failure with hypoxia: Secondary | ICD-10-CM | POA: Diagnosis not present

## 2019-09-08 DIAGNOSIS — J8489 Other specified interstitial pulmonary diseases: Secondary | ICD-10-CM | POA: Diagnosis not present

## 2019-09-08 LAB — MRSA PCR SCREENING: MRSA by PCR: NEGATIVE

## 2019-09-08 LAB — URINALYSIS, ROUTINE W REFLEX MICROSCOPIC
Bilirubin Urine: NEGATIVE
Glucose, UA: NEGATIVE mg/dL
Hgb urine dipstick: NEGATIVE
Ketones, ur: NEGATIVE mg/dL
Leukocytes,Ua: NEGATIVE
Nitrite: NEGATIVE
Protein, ur: NEGATIVE mg/dL
Specific Gravity, Urine: 1.01 (ref 1.005–1.030)
pH: 6 (ref 5.0–8.0)

## 2019-09-08 LAB — BLOOD GAS, ARTERIAL
Acid-base deficit: 3.7 mmol/L — ABNORMAL HIGH (ref 0.0–2.0)
Bicarbonate: 18.9 mmol/L — ABNORMAL LOW (ref 20.0–28.0)
FIO2: 100
O2 Saturation: 89.8 %
Patient temperature: 98.6
pCO2 arterial: 28.1 mmHg — ABNORMAL LOW (ref 32.0–48.0)
pH, Arterial: 7.442 (ref 7.350–7.450)
pO2, Arterial: 63.8 mmHg — ABNORMAL LOW (ref 83.0–108.0)

## 2019-09-08 LAB — COMPREHENSIVE METABOLIC PANEL
ALT: 18 U/L (ref 0–44)
AST: 37 U/L (ref 15–41)
Albumin: 2.5 g/dL — ABNORMAL LOW (ref 3.5–5.0)
Alkaline Phosphatase: 130 U/L — ABNORMAL HIGH (ref 38–126)
Anion gap: 19 — ABNORMAL HIGH (ref 5–15)
BUN: 11 mg/dL (ref 8–23)
CO2: 19 mmol/L — ABNORMAL LOW (ref 22–32)
Calcium: 8.1 mg/dL — ABNORMAL LOW (ref 8.9–10.3)
Chloride: 88 mmol/L — ABNORMAL LOW (ref 98–111)
Creatinine, Ser: 0.83 mg/dL (ref 0.61–1.24)
GFR calc Af Amer: >60 mL/min (ref >60)
GFR calc non Af Amer: >60 mL/min (ref >60)
Glucose, Bld: 244 mg/dL — ABNORMAL HIGH (ref 70–99)
Potassium: 2.6 mmol/L — CL (ref 3.5–5.1)
Sodium: 126 mmol/L — ABNORMAL LOW (ref 135–145)
Total Bilirubin: 0.9 mg/dL (ref 0.3–1.2)
Total Protein: 7.2 g/dL (ref 6.5–8.1)

## 2019-09-08 LAB — CBC WITH DIFFERENTIAL/PLATELET
Abs Immature Granulocytes: 0.64 10*3/uL — ABNORMAL HIGH (ref 0.00–0.07)
Basophils Absolute: 0 10*3/uL (ref 0.0–0.1)
Basophils Relative: 0 %
Eosinophils Absolute: 0 10*3/uL (ref 0.0–0.5)
Eosinophils Relative: 0 %
HCT: 37.8 % — ABNORMAL LOW (ref 39.0–52.0)
Hemoglobin: 12.9 g/dL — ABNORMAL LOW (ref 13.0–17.0)
Immature Granulocytes: 2 %
Lymphocytes Relative: 4 %
Lymphs Abs: 1.7 10*3/uL (ref 0.7–4.0)
MCH: 27.6 pg (ref 26.0–34.0)
MCHC: 34.1 g/dL (ref 30.0–36.0)
MCV: 80.8 fL (ref 80.0–100.0)
Monocytes Absolute: 1.4 10*3/uL — ABNORMAL HIGH (ref 0.1–1.0)
Monocytes Relative: 3 %
Neutro Abs: 37.9 10*3/uL — ABNORMAL HIGH (ref 1.7–7.7)
Neutrophils Relative %: 91 %
Platelets: 675 10*3/uL — ABNORMAL HIGH (ref 150–400)
RBC: 4.68 MIL/uL (ref 4.22–5.81)
RDW: 15.4 % (ref 11.5–15.5)
WBC: 41.7 10*3/uL — ABNORMAL HIGH (ref 4.0–10.5)
nRBC: 0 % (ref 0.0–0.2)

## 2019-09-08 LAB — BODY FLUID CELL COUNT WITH DIFFERENTIAL
Lymphs, Fluid: 3 %
Monocyte-Macrophage-Serous Fluid: 2 % — ABNORMAL LOW (ref 50–90)
Neutrophil Count, Fluid: 95 % — ABNORMAL HIGH (ref 0–25)
Total Nucleated Cell Count, Fluid: 97170 cu mm — ABNORMAL HIGH (ref 0–1000)

## 2019-09-08 LAB — PROTEIN, PLEURAL OR PERITONEAL FLUID: Total protein, fluid: 5 g/dL

## 2019-09-08 LAB — GLUCOSE, PLEURAL OR PERITONEAL FLUID: Glucose, Fluid: <20 mg/dL

## 2019-09-08 LAB — TROPONIN I (HIGH SENSITIVITY)
Troponin I (High Sensitivity): 15 ng/L (ref <18)
Troponin I (High Sensitivity): 35 ng/L — ABNORMAL HIGH (ref <18)

## 2019-09-08 LAB — LACTIC ACID, PLASMA
Lactic Acid, Venous: 5.5 mmol/L (ref 0.5–1.9)
Lactic Acid, Venous: 3.8 mmol/L (ref 0.5–1.9)

## 2019-09-08 LAB — PROTIME-INR
INR: 1.5 — ABNORMAL HIGH (ref 0.8–1.2)
Prothrombin Time: 17.3 seconds — ABNORMAL HIGH (ref 11.4–15.2)

## 2019-09-08 LAB — LACTATE DEHYDROGENASE, PLEURAL OR PERITONEAL FLUID: LD, Fluid: 8909 U/L — ABNORMAL HIGH (ref 3–23)

## 2019-09-08 LAB — LACTATE DEHYDROGENASE: LDH: 235 U/L — ABNORMAL HIGH (ref 98–192)

## 2019-09-08 LAB — APTT: aPTT: 33 seconds (ref 24–36)

## 2019-09-08 LAB — SARS CORONAVIRUS 2 BY RT PCR (HOSPITAL ORDER, PERFORMED IN ~~LOC~~ HOSPITAL LAB): SARS Coronavirus 2: NEGATIVE

## 2019-09-08 LAB — BRAIN NATRIURETIC PEPTIDE: B Natriuretic Peptide: 237.5 pg/mL — ABNORMAL HIGH (ref 0.0–100.0)

## 2019-09-08 MED ORDER — CHLORHEXIDINE GLUCONATE CLOTH 2 % EX PADS
6.0000 | MEDICATED_PAD | Freq: Every day | CUTANEOUS | Status: DC
  Administered 2019-09-08 – 2019-09-21 (×9): 6 via TOPICAL

## 2019-09-08 MED ORDER — SODIUM CHLORIDE (PF) 0.9 % IJ SOLN
10.0000 mg | Freq: Once | INTRAMUSCULAR | Status: AC
  Administered 2019-09-08: 10 mg via INTRAPLEURAL
  Filled 2019-09-08: qty 10

## 2019-09-08 MED ORDER — MAGNESIUM SULFATE 2 GM/50ML IV SOLN
2.0000 g | Freq: Once | INTRAVENOUS | Status: AC
  Administered 2019-09-08: 2 g via INTRAVENOUS
  Filled 2019-09-08: qty 50

## 2019-09-08 MED ORDER — LEVOFLOXACIN IN D5W 750 MG/150ML IV SOLN
750.0000 mg | Freq: Once | INTRAVENOUS | Status: AC
  Administered 2019-09-08: 750 mg via INTRAVENOUS
  Filled 2019-09-08: qty 150

## 2019-09-08 MED ORDER — LEVOFLOXACIN IN D5W 750 MG/150ML IV SOLN
750.0000 mg | INTRAVENOUS | Status: DC
  Administered 2019-09-09 – 2019-09-14 (×6): 750 mg via INTRAVENOUS
  Filled 2019-09-08 (×7): qty 150

## 2019-09-08 MED ORDER — DOCUSATE SODIUM 100 MG PO CAPS
100.0000 mg | ORAL_CAPSULE | Freq: Two times a day (BID) | ORAL | Status: DC | PRN
  Administered 2019-09-12 – 2019-09-13 (×2): 100 mg via ORAL
  Filled 2019-09-08 (×2): qty 1

## 2019-09-08 MED ORDER — POLYETHYLENE GLYCOL 3350 17 G PO PACK
17.0000 g | PACK | Freq: Every day | ORAL | Status: DC | PRN
  Administered 2019-09-12: 17 g via ORAL
  Filled 2019-09-08: qty 1

## 2019-09-08 MED ORDER — SODIUM CHLORIDE 0.9 % IV BOLUS
1000.0000 mL | Freq: Once | INTRAVENOUS | Status: AC
  Administered 2019-09-08: 1000 mL via INTRAVENOUS

## 2019-09-08 MED ORDER — ORAL CARE MOUTH RINSE
15.0000 mL | Freq: Two times a day (BID) | OROMUCOSAL | Status: DC
  Administered 2019-09-09 (×2): 15 mL via OROMUCOSAL

## 2019-09-08 MED ORDER — POTASSIUM CHLORIDE CRYS ER 20 MEQ PO TBCR: 40.0000 meq | EXTENDED_RELEASE_TABLET | Freq: Once | ORAL | Status: DC

## 2019-09-08 MED ORDER — VANCOMYCIN HCL IN DEXTROSE 1-5 GM/200ML-% IV SOLN
1000.0000 mg | Freq: Once | INTRAVENOUS | Status: AC
  Administered 2019-09-08: 1000 mg via INTRAVENOUS
  Filled 2019-09-08: qty 200

## 2019-09-08 MED ORDER — SODIUM CHLORIDE (PF) 0.9 % IJ SOLN
INTRAMUSCULAR | Status: AC
  Filled 2019-09-08: qty 50

## 2019-09-08 MED ORDER — IOHEXOL 300 MG/ML  SOLN
75.0000 mL | Freq: Once | INTRAMUSCULAR | Status: AC | PRN
  Administered 2019-09-08: 75 mL via INTRAVENOUS

## 2019-09-08 MED ORDER — POTASSIUM CHLORIDE 10 MEQ/100ML IV SOLN
10.0000 meq | Freq: Once | INTRAVENOUS | Status: AC
  Administered 2019-09-08: 10 meq via INTRAVENOUS
  Filled 2019-09-08: qty 100

## 2019-09-08 MED ORDER — POTASSIUM CHLORIDE 10 MEQ/100ML IV SOLN
10.0000 meq | INTRAVENOUS | Status: AC
  Administered 2019-09-08 (×3): 10 meq via INTRAVENOUS
  Filled 2019-09-08 (×3): qty 100

## 2019-09-08 MED ORDER — STERILE WATER FOR INJECTION IJ SOLN
5.0000 mg | Freq: Once | RESPIRATORY_TRACT | Status: AC
  Administered 2019-09-08: 5 mg via INTRAPLEURAL
  Filled 2019-09-08: qty 5

## 2019-09-08 MED ORDER — ENOXAPARIN SODIUM 40 MG/0.4ML ~~LOC~~ SOLN
40.0000 mg | SUBCUTANEOUS | Status: DC
  Administered 2019-09-08 – 2019-09-22 (×15): 40 mg via SUBCUTANEOUS
  Filled 2019-09-08 (×15): qty 0.4

## 2019-09-08 MED ORDER — CHLORHEXIDINE GLUCONATE 0.12 % MT SOLN
15.0000 mL | Freq: Two times a day (BID) | OROMUCOSAL | Status: DC
  Administered 2019-09-08 – 2019-09-09 (×3): 15 mL via OROMUCOSAL
  Filled 2019-09-08 (×2): qty 15

## 2019-09-08 MED ORDER — METHYLPREDNISOLONE SODIUM SUCC 125 MG IJ SOLR
125.0000 mg | Freq: Once | INTRAMUSCULAR | Status: AC
  Administered 2019-09-08: 125 mg via INTRAVENOUS
  Filled 2019-09-08: qty 2

## 2019-09-08 MED ORDER — LIDOCAINE HCL 1 % IJ SOLN
INTRAMUSCULAR | Status: AC
  Filled 2019-09-08: qty 20

## 2019-09-08 NOTE — Procedures (Signed)
Thoracentesis Procedure Note  Pre-operative Diagnosis: large right pleural effusion  Post-operative Diagnosis: same  Indications: large right pleural effusion  Procedure Details   Consent: Informed consent was obtained. Risks of the procedure were discussed including: infection, bleeding, pain, pneumothorax.  Under sterile conditions the patient was positioned. Betadine solution and sterile drapes were utilized.  1% plain lidocaine was used to anesthetize the 8th rib space. Fluid was obtained without any difficulties and minimal blood loss.  A dressing was applied to the wound and wound care instructions were provided.   Findings 250 ml of cloudy pleural fluid was obtained. A sample was sent to Pathology for cytogenetics, flow, and cell counts, as well as for infection analysis.  Complications:  None; patient tolerated the procedure well.        Condition: stable  Plan A follow up chest x-ray was ordered. Bed Rest for 1 hours. Tylenol 650 mg. for pain.  Attending Attestation: I performed the procedure.

## 2019-09-08 NOTE — Progress Notes (Signed)
Notified bedside nurse of need to draw repeat lactic acid. 

## 2019-09-08 NOTE — ED Notes (Signed)
Report to floor, this Rn to transport pt to CT then to ICU

## 2019-09-08 NOTE — Progress Notes (Signed)
TPA and dornase alpha instilled in to chest tube and clamped in place  Will allow 2 hours of dwell time and encourage patient to change position frequently  Allow to drain following 2 hours of dwell time

## 2019-09-08 NOTE — ED Notes (Signed)
Attempt to call report, pt to go for CT then report to be called per floor

## 2019-09-08 NOTE — Consult Note (Addendum)
NAME:  Edwin Torres, MRN:  [id number], DOB:  15-Oct-1955, LOS: 0 ADMISSION DATE:  09/08/2019, CONSULTATION DATE:  09/08/2019 REFERRING MD:  Dr Milton Ferguson, CHIEF COMPLAINT:  SOB   Brief History   Ssked to see patient for respiratory failure Came in with cough and shortness of breath of a few days duration  Lab work the previous day had shown elevated white count and it was recommended that he be evaluated in the hospital Found to have a large right pleural effusion worsening shortness of breath led to requirement for BiPAP  History of 3rd nerve palsy was from 2018 as per notes from PCP office, the patient had told me that it was 3 weeks prior- recent CT in 2021 for evaluation of right eye droop Also complains of numbness of the right arm  Past Medical History   Past Medical History:  Diagnosis Date  . Alcohol addiction ([place])   . Allergy   . Cancer ([place])    Melanoma on face was removed  . Depression   . Herpes genitalis in men   . History of chicken pox   . Seizures ([place])     Significant Hospital Events   Thoracentesis 09/08/2019 significant for 250 cc of cloudy fluid  Consults:  PCCM  Procedures:  Thoracentesis 09/08/2019  Significant Diagnostic Tests:  Pleural fluid is exudative  Micro Data:  Pleural fluid-negative  Antimicrobials:   Levofloxacin>> Vancomycin>> Interim history/subjective:  Feeling better since he had thoracentesis  Objective   Blood pressure 123/85, pulse (!) 109, temperature 97.8 F (36.6 C), temperature source Oral, resp. rate (!) 22, height 5\' 5"  (1.651 m), weight 56.7 kg, SpO2 94 %.    FiO2 (%):  [100 %] 100 %   Intake/Output Summary (Last 24 hours) at 09/08/2019 1136 Last data filed at 09/08/2019 1116 Gross per 24 hour  Intake 2497.29 ml  Output --  Net 2497.29 ml   Filed Weights   09/08/19 0907  Weight: 56.7 kg    Examination: General: Middle-age gentleman does not appear to be in acute distress HENT: BiPAP in place Lungs:  Decreased air entry bilaterally, worse on the right with dullness to percussion at the right base Cardiovascular: S1-S2 appreciated Abdomen: Bowel sounds appreciated Extremities: No clubbing, no edema Neuro: Alert and oriented, ptosis GU:   Resolved Hospital Problem list     Assessment & Plan:   Community-acquired pneumonia -Has not been sick for a long time -No contact with anyone with a febrile illness -No recent antibiotics -Infiltrative process on chest x-ray with large pleural effusion  Sepsis -Tachycardia, tachypnea, leukocytosis, elevated lactate  Acute hypoxemic respiratory failure -Continue BiPAP -Adjust BiPAP for comfort and also for saturations greater than 90%  Large right pleural effusion -May be related to pneumonia  -Possible empyema  -Post bronchoscopy  Ultrasound of the chest did reveal loculated effusion  possibility of Pancoast tumor-right eye droop, numbness right upper extremity in a smoker and possibly underlying obstructive lung disease  Active smoker -Smoking cessation counseling  Continue BiPAP Obtain CT chest with contrast to assess for tumor, lymphadenopathy, if there is any postobstructive component, loculated pleural effusion  Electrolyte derangement including hyponatremia, hypokalemia  Best practice:  Diet: Regular Pain/Anxiety/Delirium protocol (if indicated):  VAP protocol (if indicated): Not indicated DVT prophylaxis: Lovenox Mobility: Bedrest Code Status: Full code Family Communication: will update Disposition: icu  Labs   CBC: Recent Labs  Lab 09/07/19 0805 09/08/19 0851  WBC 23.0 Repeated and verified X2.* 41.7*  NEUTROABS 17.9*  37.9*  HGB 12.0* 12.9*  HCT 35.8* 37.8*  MCV 82.4 80.8  PLT 629.0* 675*    Basic Metabolic Panel: Recent Labs  Lab 09/08/19 0851  NA 126*  K 2.6*  CL 88*  CO2 19*  GLUCOSE 244*  BUN 11  CREATININE 0.83  CALCIUM 8.1*   GFR: Estimated Creatinine Clearance: 71.2 mL/min (by C-G  formula based on SCr of 0.83 mg/dL). Recent Labs  Lab 09/07/19 0805 09/08/19 0851 09/08/19 1047  WBC 23.0 Repeated and verified X2.* 41.7*  --   LATICACIDVEN  --  5.5* 3.8*    Liver Function Tests: Recent Labs  Lab 09/08/19 0851  AST 37  ALT 18  ALKPHOS 130*  BILITOT 0.9  PROT 7.2  ALBUMIN 2.5*   No results for input(s): LIPASE, AMYLASE in the last 168 hours. No results for input(s): AMMONIA in the last 168 hours.  ABG    Component Value Date/Time   PHART 7.442 09/08/2019 0930   PCO2ART 28.1 (L) 09/08/2019 0930   PO2ART 63.8 (L) 09/08/2019 0930   HCO3 18.9 (L) 09/08/2019 0930   ACIDBASEDEF 3.7 (H) 09/08/2019 0930   O2SAT 89.8 09/08/2019 0930     Coagulation Profile: Recent Labs  Lab 09/08/19 0851  INR 1.5*    Cardiac Enzymes: No results for input(s): CKTOTAL, CKMB, CKMBINDEX, TROPONINI in the last 168 hours.  HbA1C: Hgb A1c MFr Bld  Date/Time Value Ref Range Status  07/13/2019 11:21 AM 6.5 4.6 - 6.5 % Final    Comment:    Glycemic Control Guidelines for People with Diabetes:Non Diabetic:  <6%Goal of Therapy: <7%Additional Action Suggested:  >8%   03/21/2016 11:53 AM 5.5 4.6 - 6.5 % Final    Comment:    Glycemic Control Guidelines for People with Diabetes:Non Diabetic:  <6%Goal of Therapy: <7%Additional Action Suggested:  >8%     CBG: No results for input(s): GLUCAP in the last 168 hours.  Review of Systems:   No weight loss Significant alcohol use Active smoker  Past Medical History  He,  has a past medical history of Alcohol addiction ([place]), Allergy, Cancer ([place]), Depression, Herpes genitalis in men, History of chicken pox, and Seizures ([place]).   Surgical History    Past Surgical History:  Procedure Laterality Date  . FOOT FRACTURE SURGERY Bilateral   . NOSE SURGERY    . VASECTOMY       Social History   reports that he has been smoking. He has been smoking about 1.00 pack per day. He uses smokeless tobacco. He reports previous alcohol use.  He reports that he does not use drugs.   Family History   His family history includes Alcohol abuse in his father; Arthritis in his mother and sister; Cancer in his mother; Diabetes in his mother; Hyperlipidemia in his father.   Allergies Allergies  Allergen Reactions  . Penicillins Anaphylaxis    Has patient had a PCN reaction causing immediate rash, facial/tongue/throat swelling, SOB or lightheadedness with hypotension: Yes Has patient had a PCN reaction causing severe rash involving mucus membranes or skin necrosis: No Has patient had a PCN reaction that required hospitalization: Yes Has patient had a PCN reaction occurring within the last 10 years: No If all of the above answers are "NO", then may proceed with Cephalosporin use.   Francee Gentile [Lorazepam] Other (See Comments)    Made him crazy    Sherrilyn Rist, MD [facility] Pager: [phone]

## 2019-09-08 NOTE — ED Notes (Signed)
Pt tolerating Bipap well. spo2 >90's cont.

## 2019-09-08 NOTE — ED Notes (Signed)
CRITICAL VALUE STICKER  CRITICAL VALUE:K 2.6  RECEIVER (on-site recipient of call): Benjamine Sprague, RN  DATE & TIME NOTIFIED:  09/08/2019 1006  MESSENGER (representative from lab): Lonia Skinner  MD NOTIFIED: MD Zammit  TIME OF NOTIFICATION: 1007  RESPONSE: see orders

## 2019-09-08 NOTE — Progress Notes (Signed)
Pharmacy Antibiotic Note  Edwin Torres is a 64 y.o. male admitted on 09/08/2019 with pneumonia, pleural effusion s/p chest tube with TPA and dornase alpha instillation.  Pharmacy has been consulted for levaquin dosing; note history of anaphylaxis with PCN that required hospitalization.  Plan: Levaquin 750mg  IV q24h Follow up renal function & cultures  Height: 5\' 5"  (165.1 cm) Weight: 56.7 kg (125 lb) IBW/kg (Calculated) : 61.5  Temp (24hrs), Avg:97.8 F (36.6 C), Min:97.5 F (36.4 C), Max:97.8 F (36.6 C)  Recent Labs  Lab 09/07/19 0805 09/08/19 0851 09/08/19 1047  WBC 23.0 Repeated and verified X2.* 41.7*  --   CREATININE  --  0.83  --   LATICACIDVEN  --  5.5* 3.8*    Estimated Creatinine Clearance: 71.2 mL/min (by C-G formula based on SCr of 0.83 mg/dL).    Allergies  Allergen Reactions  . Penicillins Anaphylaxis    Has patient had a PCN reaction causing immediate rash, facial/tongue/throat swelling, SOB or lightheadedness with hypotension: Yes Has patient had a PCN reaction causing severe rash involving mucus membranes or skin necrosis: No Has patient had a PCN reaction that required hospitalization: Yes Has patient had a PCN reaction occurring within the last 10 years: No If all of the above answers are "NO", then may proceed with Cephalosporin use.   . Ativan [Lorazepam] Other (See Comments)    Made him crazy    Antimicrobials this admission:  7/15 Vanc x 1 7/15 Levaquin >>  Dose adjustments this admission:   Microbiology results:  7/15 MRSA PCR: neg 7/15 BCx: ngtd 7/15 Pleural fluid: gram stain - GPC chains, cx IP 7/15 UCx:  Thank you for allowing pharmacy to be a part of this patient's care.  Peggyann Juba, PharmD, BCPS Pharmacy: [phone] 09/08/2019 7:24 PM

## 2019-09-08 NOTE — ED Provider Notes (Signed)
[facility] DEPT Provider Note   CSN: [id number] Arrival date & time: 09/08/19  0830     History Chief Complaint  Patient presents with  . Shortness of Breath  . Cough    Edwin Torres is a 64 y.o. male.      Patient complains of a cough for a week.  Now he complains of shortness of breath.  Patient had lab work yesterday that showed white count of 22,000.  When he arrived at the emergency department he was on 100% oxygen with his O2 sats mid 90  The history is provided by the patient and medical records.  Shortness of Breath Severity:  Severe Onset quality:  Sudden Timing:  Constant Progression:  Worsening Chronicity:  New Context: known allergens   Relieved by:  Nothing Exacerbated by: Any exertion. Ineffective treatments:  None tried Associated symptoms: cough   Associated symptoms: no abdominal pain, no chest pain, no headaches and no rash   Risk factors: alcohol use   Cough Associated symptoms: shortness of breath   Associated symptoms: no chest pain, no eye discharge, no headaches and no rash        Past Medical History:  Diagnosis Date  . Alcohol addiction ([place])   . Allergy   . Cancer ([place])    Melanoma on face was removed  . Depression   . Herpes genitalis in men   . History of chicken pox   . Seizures [facility])     Patient Active Problem List   Diagnosis Date Noted  . Patient non-compliant, refuses any preventative care measures 04/04/2019  . Hypomagnesemia 03/10/2019  . Dysphagia 03/04/2019  . Moderate Neurocognitive deficits 03/04/2019  . Hypokalemia 02/24/2019  . Somnolence, daytime 02/24/2019  . Alcohol abuse with intoxication ([place]) 02/22/2019  . Frequent falls 02/22/2019  . Macrocytic anemia 02/22/2019  . Tobacco abuse 02/22/2019  . Physical deconditioning 02/22/2019  . Thrombocytopenia ([place]) 02/22/2019  . Weakness of right third cranial nerve 03/21/2016    Past Surgical History:  Procedure Laterality Date    . FOOT FRACTURE SURGERY Bilateral   . NOSE SURGERY    . VASECTOMY         Family History  Problem Relation Age of Onset  . Cancer Mother        bladder  . Diabetes Mother   . Arthritis Mother   . Hyperlipidemia Father   . Alcohol abuse Father   . Arthritis Sister     Social History   Tobacco Use  . Smoking status: Current Every Day Smoker    Packs/day: 1.00  . Smokeless tobacco: Current User  Substance Use Topics  . Alcohol use: Not Currently  . Drug use: No    Home Medications Prior to Admission medications   Medication Sig Start Date End Date Taking? Authorizing Provider  gabapentin (NEURONTIN) 100 MG capsule Take 1 capsule at bedtime. May increase as tolerated by 100 mg to a maximum of 300 mg nightly. Patient taking differently: Take 100 mg by mouth at bedtime. Take 1 capsule at bedtime. May increase as tolerated by 100 mg to a maximum of 300 mg nightly. 08/19/19  Yes Inda Coke, PA  ibuprofen (ADVIL) 200 MG tablet Take 600 mg by mouth every 6 (six) hours as needed for headache or moderate pain.    Yes [provider]  amitriptyline (ELAVIL) 10 MG tablet Take 1 tablet (10 mg total) by mouth at bedtime. Patient not taking: Reported on 09/08/2019 07/13/19  Morene Rankins, [place], PA  feeding supplement, ENSURE ENLIVE, (ENSURE ENLIVE) LIQD Take 237 mLs by mouth 2 (two) times daily between meals. Patient not taking: Reported on 09/08/2019 03/03/19   Flora Lipps, MD    Allergies    Penicillins and Ativan [lorazepam]  Review of Systems   Review of Systems  Constitutional: Negative for appetite change and fatigue.  HENT: Negative for congestion, ear discharge and sinus pressure.   Eyes: Negative for discharge.  Respiratory: Positive for cough and shortness of breath.   Cardiovascular: Negative for chest pain.  Gastrointestinal: Negative for abdominal pain and diarrhea.  Genitourinary: Negative for frequency and hematuria.  Musculoskeletal: Negative for back  pain.  Skin: Negative for rash.  Neurological: Negative for seizures and headaches.  Psychiatric/Behavioral: Negative for hallucinations.    Physical Exam Updated Vital Signs BP (!) 149/94 (BP Location: Right Arm)   Pulse (!) 128   Temp 97.8 F (36.6 C) (Oral)   Resp (!) 25   Ht 5\' 5"  (1.651 m)   Wt 56.7 kg   SpO2 91%   BMI 20.80 kg/m   Physical Exam Vitals and nursing note reviewed.  Constitutional:      Appearance: He is well-developed.  HENT:     Head: Normocephalic.     Nose: Nose normal.  Eyes:     General: No scleral icterus.    Conjunctiva/sclera: Conjunctivae normal.  Neck:     Thyroid: No thyromegaly.  Cardiovascular:     Rate and Rhythm: Regular rhythm.     Heart sounds: No murmur heard.  No friction rub. No gallop.      Comments: Sinus tach about 140 Pulmonary:     Effort: Respiratory distress present.     Breath sounds: No stridor. No wheezing or rales.     Comments: Decreased respirations right lung Chest:     Chest wall: No tenderness.  Abdominal:     General: There is no distension.     Tenderness: There is no abdominal tenderness. There is no rebound.  Musculoskeletal:        General: Normal range of motion.     Cervical back: Neck supple.  Lymphadenopathy:     Cervical: No cervical adenopathy.  Skin:    General: Skin is warm.     Findings: No erythema or rash.  Neurological:     Mental Status: He is alert and oriented to person, place, and time.     Motor: No abnormal muscle tone.     Coordination: Coordination normal.  Psychiatric:        Behavior: Behavior normal.     ED Results / Procedures / Treatments   Labs (all labs ordered are listed, but only abnormal results are displayed) Labs Reviewed  LACTIC ACID, PLASMA - Abnormal; Notable for the following components:      Result Value   Lactic Acid, Venous 5.5 (*)    All other components within normal limits  COMPREHENSIVE METABOLIC PANEL - Abnormal; Notable for the following  components:   Sodium 126 (*)    Potassium 2.6 (*)    Chloride 88 (*)    CO2 19 (*)    Glucose, Bld 244 (*)    Calcium 8.1 (*)    Albumin 2.5 (*)    Alkaline Phosphatase 130 (*)    Anion gap 19 (*)    All other components within normal limits  CBC WITH DIFFERENTIAL/PLATELET - Abnormal; Notable for the following components:   WBC 41.7 (*)    Hemoglobin  12.9 (*)    HCT 37.8 (*)    Platelets 675 (*)    Neutro Abs 37.9 (*)    Monocytes Absolute 1.4 (*)    Abs Immature Granulocytes 0.64 (*)    All other components within normal limits  PROTIME-INR - Abnormal; Notable for the following components:   Prothrombin Time 17.3 (*)    INR 1.5 (*)    All other components within normal limits  BRAIN NATRIURETIC PEPTIDE - Abnormal; Notable for the following components:   B Natriuretic Peptide 237.5 (*)    All other components within normal limits  BLOOD GAS, ARTERIAL - Abnormal; Notable for the following components:   pCO2 arterial 28.1 (*)    pO2, Arterial 63.8 (*)    Bicarbonate 18.9 (*)    Acid-base deficit 3.7 (*)    All other components within normal limits  SARS CORONAVIRUS 2 BY RT PCR (HOSPITAL ORDER, [facility])  CULTURE, BLOOD (ROUTINE X 2)  CULTURE, BLOOD (ROUTINE X 2)  URINE CULTURE  APTT  LACTIC ACID, PLASMA  URINALYSIS, ROUTINE W REFLEX MICROSCOPIC  TROPONIN I (HIGH SENSITIVITY)  TROPONIN I (HIGH SENSITIVITY)    EKG None  Radiology DG Chest Port 1 View  Result Date: 09/08/2019 CLINICAL DATA:  Shortness of breath EXAM: PORTABLE CHEST 1 VIEW COMPARISON:  Jul 12, 2019 FINDINGS: There is a sizable right pleural effusion. There is diffuse interstitial and alveolar opacity bilaterally. Heart size and pulmonary vascularity are within normal limits. There is lower thoracic dextroscoliosis. There is an old healed fracture of the right posterior seventh rib. There is left carotid artery calcification IMPRESSION: Sizable right pleural effusion. Diffuse  interstitial and alveolar opacity bilaterally consistent with either multifocal pneumonia or edema. There may be a degree of ARDS as well. More thanone of these entities may be present concurrently. Heart size within normal limits. No adenopathy evident by radiography. There is left carotid artery calcification. Electronically Signed   By: Lowella Grip III M.D.   On: 09/08/2019 09:07    Procedures Procedures (including critical care time)  Medications Ordered in ED Medications  vancomycin (VANCOCIN) IVPB 1000 mg/200 mL premix (1,000 mg Intravenous New Bag/Given 09/08/19 1016)  potassium chloride 10 mEq in 100 mL IVPB (10 mEq Intravenous New Bag/Given 09/08/19 1014)  potassium chloride 10 mEq in 100 mL IVPB (has no administration in time range)  levofloxacin (LEVAQUIN) IVPB 750 mg (750 mg Intravenous New Bag/Given 09/08/19 0905)  methylPREDNISolone sodium succinate (SOLU-MEDROL) 125 mg/2 mL injection 125 mg (125 mg Intravenous Given 09/08/19 0906)  sodium chloride 0.9 % bolus 1,000 mL (1,000 mLs Intravenous New Bag/Given 09/08/19 0907)  magnesium sulfate IVPB 2 g 50 mL ( Intravenous Stopped 09/08/19 1006)  sodium chloride 0.9 % bolus 1,000 mL (1,000 mLs Intravenous New Bag/Given 09/08/19 5397)    ED Course  I have reviewed the triage vital signs and the nursing notes.  Pertinent labs & imaging results that were available during my care of the patient were reviewed by me and considered in my medical decision making (see chart for details). .edcr CRITICAL CARE Performed by: Milton Ferguson Total critical care time: 40 minutes Critical care time was exclusive of separately billable procedures and treating other patients. Critical care was necessary to treat or prevent imminent or life-threatening deterioration. Critical care was time spent personally by me on the following activities: development of treatment plan with patient and/or surrogate as well as nursing, discussions with consultants,  evaluation of patient's response to  treatment, examination of patient, obtaining history from patient or surrogate, ordering and performing treatments and interventions, ordering and review of laboratory studies, ordering and review of radiographic studies, pulse oximetry and re-evaluation of patient's condition.    MDM Rules/Calculators/A&P                          Patient with bilateral pneumonia with effusion on the right.  Patient with respiratory distress and sepsis.  Sepsis protocol was started.  Critical care was called.  Patient was placed on BiPAP          This patient presents to the ED for concern of shortness of breath this involves an extensive number of treatment options, and is a complaint that carries with it a high risk of complications and morbidity.  The differential diagnosis includes pneumonia, congestive heart failure   Lab Tests:   I Ordered, reviewed, and interpreted labs, which included CBC chemistries.  Patient has a white count 42,000.  Lactic 5.5.  Medicines ordered:   I ordered medication vancomycin and Levaquin for pneumonia  Imaging Studies ordered:   I ordered imaging studies which included chest x-ray and  I independently visualized and interpreted imaging which showed pneumonia  Additional history obtained:   Additional history obtained from records  Previous records obtained and reviewed.  Consultations Obtained:   I consulted critical care and discussed lab and imaging findings  Reevaluation:  After the interventions stated above, I reevaluated the patient and found no improvement  Critical Interventions:  . Place patient on BiPAP  Final Clinical Impression(s) / ED Diagnoses Final diagnoses:  Community acquired pneumonia, unspecified laterality    Rx / DC Orders ED Discharge Orders    None       Milton Ferguson, MD 09/08/19 1057

## 2019-09-08 NOTE — ED Notes (Signed)
Pt placed on bipap  

## 2019-09-08 NOTE — ED Notes (Signed)
Date and time results received: 09/08/19 0942   Test: Lactic acid  Critical Value: 5.5  Name of Provider Notified: Milton Ferguson MD   Orders Received? Or Actions Taken?:

## 2019-09-08 NOTE — ED Notes (Signed)
Pt resting in bed, watching TV. Work of breathing improved from first arrival. Pt cont tolerating bipap. Offers no complaints at this time.

## 2019-09-08 NOTE — ED Triage Notes (Signed)
Transported by [facility] from home-- presents with respiratory distress, cough and shob x 1 week. Had blood work checked yesterday and a reported WBC of 27. Initial O2 on RA was 40% and even with NRB O2 bumped to 78%. 20 G IV in left AC. +tachycardic.

## 2019-09-08 NOTE — ED Notes (Signed)
Attempted to take pt off Bi-PAP and on non-rebreather to CT scan, unsuccessful. O2 dropped to 68%, Bi-PAP reapplied.

## 2019-09-08 NOTE — ED Notes (Addendum)
Pt spo2 down to 70's, MD at bedside. NRB adjusted, Hosmer placed as well. Resp called for bipap

## 2019-09-08 NOTE — Procedures (Signed)
Insertion of Chest Tube Procedure Note  Edwin Torres  [id number]  03-14-1955  Date:09/08/19  Time:3:57 PM    Provider Performing: Clementeen Graham   Procedure: Pleural Catheter Insertion w/ Imaging Guidance [phone])  Indication(s) Effusion  Consent Risks of the procedure as well as the alternatives and risks of each were explained to the patient and/or caregiver.  Consent for the procedure was obtained and is signed in the bedside chart Pt verbally consented but could not sign due to shortness of breath  Anesthesia Topical only with 1% lidocaine    Time Out Verified patient identification, verified procedure, site/side was marked, verified correct patient position, special equipment/implants available, medications/allergies/relevant history reviewed, required imaging and test results available.   Sterile Technique Maximal sterile technique including full sterile barrier drape, hand hygiene, sterile gown, sterile gloves, mask, hair covering, sterile ultrasound probe cover (if used).   Procedure Description Ultrasound used to identify appropriate pleural anatomy for placement and overlying skin marked. Area of placement cleaned and draped in sterile fashion.  A 14 French chest tube was placed into the right pleural space using Seldinger technique. Appropriate return of fluid was obtained.  The tube was connected to atrium and placed on -20 cm H2O wall suction.   Complications/Tolerance None; patient tolerated the procedure well. Chest X-ray is ordered to verify placement.   EBL Minimal  Specimen(s) none   Erick Colace ACNP-BC [place] Pager # [phone] OR # [phone] if no answer

## 2019-09-08 NOTE — Progress Notes (Signed)
TOC CM received message from [facility] [employer] MD, to follow up on possible inpatient treatment for ETOH abuse at dc if appropriate. IP TOC CM/CSW will continue to follow for dc needs. Updated [facility] Medical Advisor. [place], [place] ED TOC CM [phone]

## 2019-09-09 ENCOUNTER — Inpatient Hospital Stay (HOSPITAL_COMMUNITY): Payer: PPO

## 2019-09-09 LAB — PH, BODY FLUID: pH, Body Fluid: 6.2

## 2019-09-09 LAB — CBC
HCT: 30.6 % — ABNORMAL LOW (ref 39.0–52.0)
Hemoglobin: 10.1 g/dL — ABNORMAL LOW (ref 13.0–17.0)
MCH: 26.9 pg (ref 26.0–34.0)
MCHC: 33 g/dL (ref 30.0–36.0)
MCV: 81.6 fL (ref 80.0–100.0)
Platelets: 481 10*3/uL — ABNORMAL HIGH (ref 150–400)
RBC: 3.75 MIL/uL — ABNORMAL LOW (ref 4.22–5.81)
RDW: 15.5 % (ref 11.5–15.5)
WBC: 20.1 10*3/uL — ABNORMAL HIGH (ref 4.0–10.5)
nRBC: 0 % (ref 0.0–0.2)

## 2019-09-09 LAB — COMPREHENSIVE METABOLIC PANEL
ALT: 15 U/L (ref 0–44)
AST: 28 U/L (ref 15–41)
Albumin: 1.9 g/dL — ABNORMAL LOW (ref 3.5–5.0)
Alkaline Phosphatase: 77 U/L (ref 38–126)
Anion gap: 10 (ref 5–15)
BUN: 12 mg/dL (ref 8–23)
CO2: 23 mmol/L (ref 22–32)
Calcium: 7.4 mg/dL — ABNORMAL LOW (ref 8.9–10.3)
Chloride: 97 mmol/L — ABNORMAL LOW (ref 98–111)
Creatinine, Ser: 0.59 mg/dL — ABNORMAL LOW (ref 0.61–1.24)
GFR calc Af Amer: >60 mL/min (ref >60)
GFR calc non Af Amer: >60 mL/min (ref >60)
Glucose, Bld: 173 mg/dL — ABNORMAL HIGH (ref 70–99)
Potassium: 3.7 mmol/L (ref 3.5–5.1)
Sodium: 130 mmol/L — ABNORMAL LOW (ref 135–145)
Total Bilirubin: 1.7 mg/dL — ABNORMAL HIGH (ref 0.3–1.2)
Total Protein: 5.3 g/dL — ABNORMAL LOW (ref 6.5–8.1)

## 2019-09-09 LAB — LACTIC ACID, PLASMA: Lactic Acid, Venous: 1.6 mmol/L (ref 0.5–1.9)

## 2019-09-09 LAB — URINE CULTURE: Culture: NO GROWTH

## 2019-09-09 LAB — PHOSPHORUS: Phosphorus: 3.6 mg/dL (ref 2.5–4.6)

## 2019-09-09 LAB — CYTOLOGY - NON PAP

## 2019-09-09 LAB — MAGNESIUM: Magnesium: 2.1 mg/dL (ref 1.7–2.4)

## 2019-09-09 MED ORDER — MELATONIN 3 MG PO TABS
3.0000 mg | ORAL_TABLET | Freq: Every evening | ORAL | Status: AC | PRN
  Administered 2019-09-09 – 2019-09-10 (×3): 3 mg via ORAL
  Filled 2019-09-09 (×3): qty 1

## 2019-09-09 MED ORDER — IBUPROFEN 400 MG PO TABS
800.0000 mg | ORAL_TABLET | Freq: Three times a day (TID) | ORAL | Status: DC | PRN
  Administered 2019-09-09 – 2019-09-21 (×8): 800 mg via ORAL
  Filled 2019-09-09 (×2): qty 2
  Filled 2019-09-09: qty 1
  Filled 2019-09-09 (×2): qty 2
  Filled 2019-09-09 (×2): qty 1
  Filled 2019-09-09: qty 2

## 2019-09-09 MED ORDER — VANCOMYCIN HCL 1250 MG/250ML IV SOLN
1250.0000 mg | Freq: Once | INTRAVENOUS | Status: AC
  Administered 2019-09-09: 1250 mg via INTRAVENOUS
  Filled 2019-09-09: qty 250

## 2019-09-09 MED ORDER — LACTATED RINGERS IV BOLUS
500.0000 mL | Freq: Once | INTRAVENOUS | Status: AC
  Administered 2019-09-09: 500 mL via INTRAVENOUS

## 2019-09-09 MED ORDER — SODIUM CHLORIDE (PF) 0.9 % IJ SOLN
10.0000 mg | Freq: Once | INTRAMUSCULAR | Status: AC
  Administered 2019-09-09: 10 mg via INTRAPLEURAL
  Filled 2019-09-09: qty 10

## 2019-09-09 MED ORDER — STERILE WATER FOR INJECTION IJ SOLN
5.0000 mg | Freq: Once | RESPIRATORY_TRACT | Status: AC
  Administered 2019-09-09: 5 mg via INTRAPLEURAL
  Filled 2019-09-09: qty 5

## 2019-09-09 MED ORDER — VANCOMYCIN HCL 750 MG/150ML IV SOLN
750.0000 mg | Freq: Two times a day (BID) | INTRAVENOUS | Status: DC
  Administered 2019-09-09: 750 mg via INTRAVENOUS
  Filled 2019-09-09 (×2): qty 150

## 2019-09-09 MED ORDER — ACETAMINOPHEN 500 MG PO TABS
1000.0000 mg | ORAL_TABLET | Freq: Three times a day (TID) | ORAL | Status: DC
  Administered 2019-09-09 – 2019-09-22 (×37): 1000 mg via ORAL
  Filled 2019-09-09 (×39): qty 2

## 2019-09-09 NOTE — Progress Notes (Signed)
Pharmacy Antibiotic Note  Edwin Torres is a 64 y.o. male admitted on 09/08/2019 with pneumonia, pleural effusion s/p chest tube with TPA and dornase alpha instillation.  Pharmacy has been consulted for levaquin dosing; note history of anaphylaxis with PCN that required hospitalization.  09/09/19 11:39 AM - CCM adding vancomycin for GPC in pleural fluid - WBC improving - afebrile - labs in process   Plan: Continue Levaquin 750mg  IV q24h Vancomycin 1250 mg iv once followed by 750 mg iv q 12 hours Follow up renal function & cultures Levels if indidcated  Height: 5\' 5"  (165.1 cm) Weight: 56.7 kg (125 lb) IBW/kg (Calculated) : 61.5  Temp (24hrs), Avg:98 F (36.7 C), Min:97.4 F (36.3 C), Max:98.9 F (37.2 C)  Recent Labs  Lab 09/07/19 0805 09/08/19 0851 09/08/19 1047 09/09/19 1125  WBC 23.0 Repeated and verified X2.* 41.7*  --  20.1*  CREATININE  --  0.83  --   --   LATICACIDVEN  --  5.5* 3.8*  --     Estimated Creatinine Clearance: 71.2 mL/min (by C-G formula based on SCr of 0.83 mg/dL).    Allergies  Allergen Reactions  . Penicillins Anaphylaxis    Has patient had a PCN reaction causing immediate rash, facial/tongue/throat swelling, SOB or lightheadedness with hypotension: Yes Has patient had a PCN reaction causing severe rash involving mucus membranes or skin necrosis: No Has patient had a PCN reaction that required hospitalization: Yes Has patient had a PCN reaction occurring within the last 10 years: No If all of the above answers are "NO", then may proceed with Cephalosporin use.   . Ativan [Lorazepam] Other (See Comments)    Made him crazy    Antimicrobials this admission:  7/15 Vanc x 1, 7/16 >>  7/15 Levaquin >>  Dose adjustments this admission:   Microbiology results:  7/15 MRSA PCR: neg 7/15 BCx: ngtd 7/15 Pleural fluid: gram stain - GPC chains, cx IP 7/15 UCx: sent  Thank you for allowing pharmacy to be a part of this patient's care.  Ulice Dash, PharmD, BCPS    09/09/2019 11:39 AM

## 2019-09-09 NOTE — Progress Notes (Signed)
Pt drainage amount from chest tube has decreased.  Chest tube has been clamped and unclamped once from 7-11 pm thus far.

## 2019-09-09 NOTE — Progress Notes (Addendum)
Removed PT from BiPAP and placed on Partial Rebreather- RN aware. PT does not appear to be in respiratory distress at this time.

## 2019-09-09 NOTE — Progress Notes (Signed)
eLink Physician-Brief Progress Note Patient Name: Edwin Torres DOB: 07/23/1955 MRN: [id number]   Date of Service  09/09/2019  HPI/Events of Note  Patient is asking for something for sleep.  eICU Interventions  Melatonin 3 mg po Q HS PRN x 3 days ordered.        Kerry Kass Meiko Stranahan 09/09/2019, 12:08 AM

## 2019-09-09 NOTE — Progress Notes (Signed)
NAME:  Edwin Torres, MRN:  [id number], DOB:  March 08, 1955, LOS: 1 ADMISSION DATE:  09/08/2019, CONSULTATION DATE:  09/08/2019 REFERRING MD:  Dr Milton Ferguson, CHIEF COMPLAINT:  SOB   Brief History   64 year old with history of depression, alcohol use, seizures admitted with community-acquired pneumonia and empyema s/p chest tube placement and TPA.  History of 3rd nerve palsy was from 2018 as per notes from PCP office, the patient had told me that it was 3 weeks prior- recent CT in 2021 for evaluation of right eye droop Also complains of numbness of the right arm  Past Medical History   Past Medical History:  Diagnosis Date  . Alcohol addiction ([place])   . Allergy   . Cancer ([place])    Melanoma on face was removed  . Depression   . Herpes genitalis in men   . History of chicken pox   . Seizures ([place])     Significant Hospital Events   7/15-admit, thoracentesis, chest tube placement with TPA  Consults:  PCCM  Procedures:  Thoracentesis 09/08/2019 significant for 250 cc of cloudy fluid  Significant Diagnostic Tests:  Pleural fluid is exudative  Micro Data:  Pleural fluid culture 09/08/2019-GPC's  Antimicrobials:  Levofloxacin>> Vancomycin>>  Interim history/subjective:   Remains on BiPAP overnight.  Awake today with no complaints.  Objective   Blood pressure 120/84, pulse 74, temperature 98.9 F (37.2 C), temperature source Axillary, resp. rate (!) 22, height 5\' 5"  (1.651 m), weight 56.7 kg, SpO2 96 %.    FiO2 (%):  [80 %-100 %] 80 %   Intake/Output Summary (Last 24 hours) at 09/09/2019 5277 Last data filed at 09/09/2019 0600 Gross per 24 hour  Intake 3017.27 ml  Output 2900 ml  Net 117.27 ml   Filed Weights   09/08/19 0907  Weight: 56.7 kg    Examination: Gen:      No acute distress, chronically ill-appearing HEENT:  EOMI, sclera anicteric, BiPAP Neck:     No masses; no thyromegaly Lungs:    Clear to auscultation bilaterally; normal respiratory effort CV:          Regular rate and rhythm; no murmurs Abd:      + bowel sounds; soft, non-tender; no palpable masses, no distension Ext:    No edema; adequate peripheral perfusion Skin:      Warm and dry; no rash Neuro: alert and oriented x 3  Labs significant for Labs from 7/15 Sodium 126, potassium 2.6, BUN/creatinine 11/0.83, lactic acid 3.8 WBC 41.7, hemoglobin 12.9, platelets 675 No new labs or imaging today  Resolved Hospital Problem list     Assessment & Plan:  Community-acquired pneumonia, empyema with GPC's on culture Continue vancomycin, levofloxacin Repeat DNase TPA today for a total of 3 days or until output is minimal Monitor chest tube output Follow cultures He will need follow-up scan after adequate treatment of pneumonia to ensure no underlying malignancy  Sepsis, unknown organism, present on admission -Tachycardia, tachypnea, leukocytosis, elevated lactate Improved since admission.  Repeat lactic acid  Acute hypoxemic respiratory failure Take off BiPAP and monitor. Can use BiPAP as needed going forward  Hyponatremia Likely related to community-acquired pneumonia. Repeat labs today  Active smoker, alcohol abuse Smoking cessation counseling Monitor for alcohol withdrawal  Right eyelid ptosis Per primary care note this has been a chronic issue since 2018.  Recent CT head was normal He has been referred to neurology as an outpatient.  Best practice:  Diet: Regular Pain/Anxiety/Delirium protocol (if indicated):  VAP protocol (if indicated): Not indicated DVT prophylaxis: Lovenox Mobility: Bedrest Code Status: Full code Family Communication: Patient updated Disposition: ICU  Critical care time:   The patient is critically ill with multiple organ system failure and requires high complexity decision making for assessment and support, frequent evaluation and titration of therapies, advanced monitoring, review of radiographic studies and interpretation of complex  data.   Critical Care Time devoted to patient care services, exclusive of separately billable procedures, described in this note is 35 minutes.   Marshell Garfinkel MD [facility] Please see [email or web address] for pager details.  09/09/2019, 9:09 AM

## 2019-09-09 NOTE — Progress Notes (Signed)
IP TPA and pulmozyme administered.   Erick Colace ACNP-BC [place] Pager # [phone] OR # [phone] if no answer

## 2019-09-10 ENCOUNTER — Inpatient Hospital Stay (HOSPITAL_COMMUNITY): Payer: PPO

## 2019-09-10 LAB — COMPREHENSIVE METABOLIC PANEL
ALT: 16 U/L (ref 0–44)
AST: 27 U/L (ref 15–41)
Albumin: 2.2 g/dL — ABNORMAL LOW (ref 3.5–5.0)
Alkaline Phosphatase: 75 U/L (ref 38–126)
Anion gap: 13 (ref 5–15)
BUN: 22 mg/dL (ref 8–23)
CO2: 21 mmol/L — ABNORMAL LOW (ref 22–32)
Calcium: 8.1 mg/dL — ABNORMAL LOW (ref 8.9–10.3)
Chloride: 98 mmol/L (ref 98–111)
Creatinine, Ser: 0.71 mg/dL (ref 0.61–1.24)
GFR calc Af Amer: >60 mL/min (ref >60)
GFR calc non Af Amer: >60 mL/min (ref >60)
Glucose, Bld: 134 mg/dL — ABNORMAL HIGH (ref 70–99)
Potassium: 3.8 mmol/L (ref 3.5–5.1)
Sodium: 132 mmol/L — ABNORMAL LOW (ref 135–145)
Total Bilirubin: 0.9 mg/dL (ref 0.3–1.2)
Total Protein: 5.7 g/dL — ABNORMAL LOW (ref 6.5–8.1)

## 2019-09-10 LAB — BODY FLUID CULTURE

## 2019-09-10 LAB — PHOSPHORUS: Phosphorus: 4.8 mg/dL — ABNORMAL HIGH (ref 2.5–4.6)

## 2019-09-10 LAB — MAGNESIUM: Magnesium: 2.1 mg/dL (ref 1.7–2.4)

## 2019-09-10 LAB — CBC
HCT: 29.4 % — ABNORMAL LOW (ref 39.0–52.0)
Hemoglobin: 9.8 g/dL — ABNORMAL LOW (ref 13.0–17.0)
MCH: 27.1 pg (ref 26.0–34.0)
MCHC: 33.3 g/dL (ref 30.0–36.0)
MCV: 81.4 fL (ref 80.0–100.0)
Platelets: 546 10*3/uL — ABNORMAL HIGH (ref 150–400)
RBC: 3.61 MIL/uL — ABNORMAL LOW (ref 4.22–5.81)
RDW: 15.6 % — ABNORMAL HIGH (ref 11.5–15.5)
WBC: 25.6 10*3/uL — ABNORMAL HIGH (ref 4.0–10.5)
nRBC: 0 % (ref 0.0–0.2)

## 2019-09-10 MED ORDER — SODIUM CHLORIDE (PF) 0.9 % IJ SOLN
10.0000 mg | Freq: Once | INTRAMUSCULAR | Status: DC
  Filled 2019-09-10: qty 10

## 2019-09-10 MED ORDER — STERILE WATER FOR INJECTION IJ SOLN
5.0000 mg | Freq: Once | RESPIRATORY_TRACT | Status: DC
  Filled 2019-09-10: qty 5

## 2019-09-10 MED ORDER — ORAL CARE MOUTH RINSE
15.0000 mL | Freq: Two times a day (BID) | OROMUCOSAL | Status: DC
  Administered 2019-09-10 – 2019-09-22 (×21): 15 mL via OROMUCOSAL

## 2019-09-10 NOTE — Progress Notes (Addendum)
NAME:  Edwin Torres, MRN:  [id number], DOB:  10-01-1955, LOS: 2 ADMISSION DATE:  09/08/2019, CONSULTATION DATE:  09/08/2019 REFERRING MD:  Dr Milton Ferguson, CHIEF COMPLAINT:  SOB   Brief History   64 year old with history of depression, alcohol use, seizures and poor dentition  admitted with community-acquired pneumonia and empyema s/p chest tube placement and TPA.  History of 3rd nerve palsy was from 2018 as per notes from PCP office      Past Medical History   Past Medical History:  Diagnosis Date  . Alcohol addiction ([place])   . Allergy   . Cancer ([place])    Melanoma on face was removed  . Depression   . Herpes genitalis in men   . History of chicken pox   . Seizures ([place])     Significant Hospital Events   7/15-admit, thoracentesis, chest tube placement with TPA  Consults:  PCCM  Procedures:  Thoracentesis 09/08/2019 significant for 250 cc of cloudy fluid R lytic rx 7/15 R Lytic rx 7/16   Significant Diagnostic Tests:  Pleural fluid from 7/15  Exudative with glucsoe <29  And wbc 97170 with 95% Polys  Micro Data:  COVID  19  PCR  Neg  MRSA PCR  7/15 > neg UC 7/15 >>> Pleural fluid culture 09/08/2019-GPC's = strep intermedius>>> BC x 2  7/15 >>>    Scheduled Meds: . acetaminophen  1,000 mg Oral TID  . chlorhexidine  15 mL Mouth Rinse BID  . Chlorhexidine Gluconate Cloth  6 each Topical Daily  . enoxaparin (LOVENOX) injection  40 mg Subcutaneous Q24H  . mouth rinse  15 mL Mouth Rinse q12n4p   Continuous Infusions: . levofloxacin (LEVAQUIN) IV Stopped (09/09/19 1053)  . vancomycin 750 mg (09/09/19 2130)   PRN Meds:.docusate sodium, ibuprofen, melatonin, polyethylene glycol   Antimicrobials:  Levofloxacin  7/15 >> Vancomycin 7/15 >> 7/17    Interim history/subjective:  Alert this am, reports had tooth pulled about 2 m pta   Did not req bipa overnight and sats  In mid 90s on FM ? 15lpm this am       Objective   Blood pressure 119/74, pulse 75,  temperature (!) 97 F (36.1 C), temperature source Axillary, resp. rate (!) 21, height 5\' 5"  (1.651 m), weight 55.7 kg, SpO2 94 %.        Intake/Output Summary (Last 24 hours) at 09/10/2019 0856 Last data filed at 09/10/2019 0600 Gross per 24 hour  Intake 990.8 ml  Output 1775 ml  Net -784.2 ml   Filed Weights   09/08/19 0907 09/10/19 0500  Weight: 56.7 kg 55.7 kg    Examination: Tmax 98  Pt alert, approp nad @ 30 degees hob  No jvd Neck supple Lungs with a few scattered exp > insp rhonchi bilaterally RRR no s3 or or sign murmur Abd with nl excursion  Extr warm with no edema or clubbing noted Neuro  Sensorium intact ,  no apparent motor deficits       I personally reviewed images and agree with radiology impression as follows:  CXR:   Portable 7/17 Unchanged right-sided thoracostomy tube with trace residual pleural fluid at the base the right lung and likely a small ex vacuo pneumothorax. Persisting bilateral right greater than left airspace and interstitial disease, slightly improved.    Resolved Hospital Problem list     Assessment & Plan:  Community-acquired pneumonia, empyema with GPC's on culture With mrsa pcr neg and strep on culture > d/c  vanc 7/17  DNase TPA  Given 7/15 and 7/16  Monitor chest tube output Follow culture senstivity but leave on levaquin as anaphylactic to pcn and needs CAP coverage Hold further lytics based on am cxr 7/17     Sepsis, unknown organism, present on admission -Tachycardia, tachypnea, leukocytosis - elevated lactate resolved 7/16  Improved since admission.     Acute hypoxemic respiratory failure Change to NP if tol 24/7   Hyponatremia Likely related to community-acquired pneumonia. Trending up  - 132 am 7/17   Active smoker, alcohol abuse Smoking cessation counseling Monitor for alcohol withdrawal  Right eyelid ptosis Per primary care note this has been a chronic issue since 2018.  Recent CT head was normal He  has been referred to neurology as an outpatient.  Best practice:  Diet: Regular Pain/Anxiety/Delirium protocol (if indicated):  VAP protocol (if indicated): Not indicated DVT prophylaxis: Lovenox Mobility: Bedrest Code Status: Full code Family Communication: Patient updated 7/17 am  Disposition: ICU     Christinia Gully, MD [facility] [place] [phone]   After 7:00 pm call Elink  [phone]

## 2019-09-11 DIAGNOSIS — J189 Pneumonia, unspecified organism: Secondary | ICD-10-CM | POA: Diagnosis present

## 2019-09-11 LAB — CHOLESTEROL, BODY FLUID: Cholesterol, Fluid: 69 mg/dL

## 2019-09-11 MED ORDER — TRAMADOL HCL 50 MG PO TABS
50.0000 mg | ORAL_TABLET | ORAL | Status: DC | PRN
  Administered 2019-09-11 – 2019-09-16 (×7): 50 mg via ORAL
  Filled 2019-09-11 (×8): qty 1

## 2019-09-11 MED ORDER — TRAMADOL HCL 50 MG PO TABS
50.0000 mg | ORAL_TABLET | Freq: Once | ORAL | Status: AC
  Administered 2019-09-11: 50 mg via ORAL
  Filled 2019-09-11: qty 1

## 2019-09-11 NOTE — Progress Notes (Signed)
eLink Physician-Brief Progress Note Patient Name: RAFAY DAHAN DOB: [birth year] MRN: [id number]   Date of Service  09/11/2019  HPI/Events of Note  Pt had ibuprofen and scheduled tylenol and he is still in pain around chest tube area. RN was wondering if you can give her a one time dose of something to help his pain  Camera: On NRB. sats ok.  As per RN, mentation fine.  Cr normal. Not obese.   CxR film seen.   eICU Interventions  Tramadol 50 mg oral once. Watch for lethrgy, hypoxemia, if so need ABG/BiPAP. Asp precautions.      Intervention Category Intermediate Interventions: Pain - evaluation and management  Elmer Sow 09/11/2019, 2:42 AM

## 2019-09-11 NOTE — Progress Notes (Signed)
NAME:  Edwin Torres, MRN:  [id number], DOB:  [birth year], LOS: 3 ADMISSION DATE:  09/08/2019, CONSULTATION DATE:  09/08/2019 REFERRING MD:  Dr Milton Ferguson, CHIEF COMPLAINT:  SOB   Brief History   64 year old with history of depression, alcohol use, seizures and poor dentition  admitted with community-acquired pneumonia and empyema s/p chest tube placement and TPA.  History of 3rd nerve palsy dates back to  2018 as per notes from PCP office      Past Medical History   Past Medical History:  Diagnosis Date   Alcohol addiction ([place])    Allergy    Cancer ([place])    Melanoma on face was removed   Depression    Herpes genitalis in men    History of chicken pox    Seizures ([place])     Significant Hospital Events   7/15-admit, thoracentesis, chest tube placement with TPA  Consults:  PCCM  Procedures:  Thoracentesis 09/08/2019 significant for 250 cc of cloudy fluid R lytic rx  7/15 R Lytic rx 7/16   Significant Diagnostic Tests:  Pleural fluid from 7/15  Exudative with glucsoe <20   And wbc 97170 with 95% Polys  Micro Data:  COVID  19  PCR  Neg  MRSA PCR  7/15 > neg Pleural fluid culture 09/08/2019-GPC's = strep intermedius>>> sensitive to levaquin  UC 7/15 >>> BC x 2  7/15 >>>    Scheduled Meds:  acetaminophen  1,000 mg Oral TID   Chlorhexidine Gluconate Cloth  6 each Topical Daily   enoxaparin (LOVENOX) injection  40 mg Subcutaneous Q24H   mouth rinse  15 mL Mouth Rinse BID   Continuous Infusions:  levofloxacin (LEVAQUIN) IV Stopped (09/10/19 1201)   PRN Meds:.docusate sodium, ibuprofen, polyethylene glycol   Antimicrobials:  Vancomycin 7/15 >> 7/17 Levofloxacin  7/15 >>    Interim history/subjective:   pain at ct site/ not able to wean off high flow 02      Objective   Blood pressure (!) 114/92, pulse 74, temperature 97.6 F (36.4 C), temperature source Axillary, resp. rate (!) 25, height 5\' 5"  (1.651 m), weight 55.8 kg, SpO2 93 %.         Intake/Output Summary (Last 24 hours) at 09/11/2019 0950 Last data filed at 09/11/2019 0600 Gross per 24 hour  Intake 989.21 ml  Output 1210 ml  Net -220.79 ml   Filed Weights   09/08/19 0907 09/10/19 0500 09/11/19 0500  Weight: 56.7 kg 55.7 kg 55.8 kg    Examination: Tmax 97.9    /  02 = partail NRM with sats 93%  Pt alert, approp nad @ 30 degrees hob  No jvd Oropharynx clear,  mucosa nl/ dentition looks ok to me  Neck supple Lungs with a few scattered exp > insp rhonchi bilaterally RRR no s3 or or sign murmur Abd soft/ nl excursion  Extr warm with no edema or clubbing noted Neuro  Sensorium intact,  no apparent motor deficits       I personally reviewed images and agree with radiology impression as follows:  CXR:   Portable 7/17 Unchanged right-sided thoracostomy tube with trace residual pleural fluid at the base the right lung and likely a small ex vacuo pneumothorax. Persisting bilateral right greater than left airspace and interstitial disease, slightly improved.    Resolved Hospital Problem list     Assessment & Plan:  Community-acquired pneumonia, empyema with GPC's on culture With mrsa pcr neg and strep on culture > d/c'd  vanc 7/17  DNase TPA  Given 7/15 and 7/16 with no significant effusion on cxr 7/17  >>> continue levaquin to cover CAP plus strep intermedius on Pleural fluid culture   Consider repeat CT chest 7/19     Sepsis, unknown organism, present on admission -Tachycardia, tachypnea, leukocytosis - elevated lactate >>>  resolved 7/16       Acute hypoxemic respiratory failure >>> continues to very 02 dep probably due to ALI  Hyponatremia Likely related to community-acquired pneumonia. Trending up  - 132 am 7/17   Active smoker, alcohol abuse Denied need for nicotine am 7/18 No symptoms of alcohol withdrawal  Right eyelid ptosis Per primary care note this has been a chronic issue since 2018.  Recent CT head was normal He has been  referred to neurology as an outpatient.  Best practice:  Diet: Regular Pain/Anxiety/Delirium protocol (if indicated):  VAP protocol (if indicated): Not indicated DVT prophylaxis: Lovenox Mobility: Bedrest Code Status: Full code Family Communication: Patient updated directly  7/18 am  Disposition: ICU due to resp status      Christinia Gully, MD [facility] [place] [phone]   After 7:00 pm call Elink  [phone]

## 2019-09-12 ENCOUNTER — Inpatient Hospital Stay (HOSPITAL_COMMUNITY): Payer: PPO

## 2019-09-12 DIAGNOSIS — J9601 Acute respiratory failure with hypoxia: Secondary | ICD-10-CM | POA: Diagnosis not present

## 2019-09-12 LAB — BASIC METABOLIC PANEL
Anion gap: 14 (ref 5–15)
Anion gap: 13 (ref 5–15)
BUN: 17 mg/dL (ref 8–23)
BUN: 16 mg/dL (ref 8–23)
CO2: 22 mmol/L (ref 22–32)
CO2: 24 mmol/L (ref 22–32)
Calcium: 7.9 mg/dL — ABNORMAL LOW (ref 8.9–10.3)
Calcium: 8 mg/dL — ABNORMAL LOW (ref 8.9–10.3)
Chloride: 99 mmol/L (ref 98–111)
Chloride: 99 mmol/L (ref 98–111)
Creatinine, Ser: 0.6 mg/dL — ABNORMAL LOW (ref 0.61–1.24)
Creatinine, Ser: 0.56 mg/dL — ABNORMAL LOW (ref 0.61–1.24)
GFR calc Af Amer: >60 mL/min (ref >60)
GFR calc Af Amer: >60 mL/min (ref >60)
GFR calc non Af Amer: >60 mL/min (ref >60)
GFR calc non Af Amer: >60 mL/min (ref >60)
Glucose, Bld: 66 mg/dL — ABNORMAL LOW (ref 70–99)
Glucose, Bld: 62 mg/dL — ABNORMAL LOW (ref 70–99)
Potassium: 3.6 mmol/L (ref 3.5–5.1)
Potassium: 3.5 mmol/L (ref 3.5–5.1)
Sodium: 135 mmol/L (ref 135–145)
Sodium: 136 mmol/L (ref 135–145)

## 2019-09-12 LAB — CBC
HCT: 27.8 % — ABNORMAL LOW (ref 39.0–52.0)
Hemoglobin: 9 g/dL — ABNORMAL LOW (ref 13.0–17.0)
MCH: 27.3 pg (ref 26.0–34.0)
MCHC: 32.4 g/dL (ref 30.0–36.0)
MCV: 84.2 fL (ref 80.0–100.0)
Platelets: 588 10*3/uL — ABNORMAL HIGH (ref 150–400)
RBC: 3.3 MIL/uL — ABNORMAL LOW (ref 4.22–5.81)
RDW: 15.9 % — ABNORMAL HIGH (ref 11.5–15.5)
WBC: 17.8 10*3/uL — ABNORMAL HIGH (ref 4.0–10.5)
nRBC: 0 % (ref 0.0–0.2)

## 2019-09-12 LAB — SEDIMENTATION RATE: Sed Rate: 90 mm/hr — ABNORMAL HIGH (ref 0–16)

## 2019-09-12 MED ORDER — PROCHLORPERAZINE EDISYLATE 10 MG/2ML IJ SOLN
5.0000 mg | Freq: Four times a day (QID) | INTRAMUSCULAR | Status: DC | PRN
  Administered 2019-09-12: 5 mg via INTRAVENOUS
  Filled 2019-09-12: qty 2

## 2019-09-12 MED ORDER — NICOTINE 21 MG/24HR TD PT24
21.0000 mg | MEDICATED_PATCH | Freq: Every day | TRANSDERMAL | Status: DC
  Administered 2019-09-12 – 2019-09-22 (×11): 21 mg via TRANSDERMAL
  Filled 2019-09-12 (×11): qty 1

## 2019-09-12 MED ORDER — POTASSIUM CHLORIDE CRYS ER 20 MEQ PO TBCR
40.0000 meq | EXTENDED_RELEASE_TABLET | Freq: Once | ORAL | Status: AC
  Administered 2019-09-12: 40 meq via ORAL
  Filled 2019-09-12: qty 2

## 2019-09-12 NOTE — Progress Notes (Signed)
NAME:  Edwin Torres, MRN:  [id number], DOB:  [birth year], LOS: 4 ADMISSION DATE:  09/08/2019, CONSULTATION DATE:  09/08/2019 REFERRING MD:  Dr Milton Ferguson, CHIEF COMPLAINT:  SOB   Brief History   64 year old with history of depression, alcohol use, seizures and poor dentition admitted with community-acquired pneumonia and empyema s/p chest tube placement and TPA.  History of 3rd nerve palsy dates back to 2018 as per notes from PCP office    Past Medical History  ETOH Abuse  Melanoma - on face, s/p removal  Depression  Genital Herpes  Seizures Right eye ptosis - 3rd nerve palsy, dates back to [facility]   7/15 Admit, thoracentesis, chest tube placement with TPA 7/19 Pain at ct site / not able to wean off high flow 02  Consults:  PCCM  Procedures:  Thoracentesis 09/08/2019 significant for 250 cc of cloudy fluid R lytic rx  7/15 R Lytic rx 7/16   Significant Diagnostic Tests:  Pleural fluid from 7/15  Exudative with glucsoe <20   And wbc 97170 with 95% Polys  Micro Data:  COVID 19 PCR >> Neg  MRSA PCR  7/15 >> neg Pleural fluid culture 7/15 >> strep intermedius >> sensitive to levaquin  UC 7/15 >> negative  BC x 2  7/15 >>  Antimicrobials:  Vancomycin 7/15 >> 7/17 Levofloxacin  7/15 >>   Interim history/subjective:  Afebrile / WBC down to 17.8 Remains on 15L O2  Glucose range 66 - 134  I/O - 1L UOP, -622ml in last 24 hours Chest tube drainage > 220 ml in last 24hours  Objective   Blood pressure 112/62, pulse 71, temperature 97.8 F (36.6 C), temperature source Axillary, resp. rate (!) 26, height 5\' 5"  (1.651 m), weight 58.9 kg, SpO2 98 %.        Intake/Output Summary (Last 24 hours) at 09/12/2019 0947 Last data filed at 09/12/2019 0600 Gross per 24 hour  Intake 360 ml  Output 1020 ml  Net -660 ml   Filed Weights   09/10/19 0500 09/11/19 0500 09/12/19 0500  Weight: 55.7 kg 55.8 kg 58.9 kg    Examination: General: adult male  lying in bed in NAD HEENT: MM pink/moist, R eye ptosis, anicteric  Neuro: AAOx4, speech clear, MAE  CV: s1s2 rrr, no m/r/g PULM: non-labored on O2, lungs bilaterally with crackles GI: soft, bsx4 active  Extremities: warm/dry, no edema  Skin: no rashes or lesions  CXR 7/19 >> pending  Resolved Hospital Problem list   Sepsis   Assessment & Plan:   Strep Intermedius Community-acquired Pneumonia with Empyema   Acute Hypoxemic Respiratory Failure Acute Lung Injury from Sepsis  S/p intrapleural tPA, DNase 7/15, 7/16.  No significant effusion on CXR 7/17 -assess CXR now and in am  -continue levaquin, D5/x  -consider repeat CT imaging pending CXR review -follow chest tube output  -continue CT to -20cm suction  -CT care per protocol  -wean O2 for sats >90%  Hyponatremia Likely related to CAP -follow Na trend  Tobacco Abuse  -smoking cessation counseling  Aalcohol Abuse -no evidence of withdrawal, monitor closely   Right Eyelid Ptosis Per primary care note this has been a chronic issue since 2018.  Recent CT head was normal. He has been referred to neurology as an outpatient. -no acute interventions  Best practice:  Diet: Regular Pain/Anxiety/Delirium protocol (if indicated):  VAP protocol (if indicated): Not indicated DVT prophylaxis: Lovenox Mobility: Bedrest Code Status: Full code Family Communication: Patient  updated 7/19 on plan of care  Disposition: change to [place], MSN, NP-C [facility] 09/12/2019, 10:03 AM   Please see [email or web address] for pager details.

## 2019-09-12 NOTE — Progress Notes (Signed)
Pharmacy Antibiotic Note  Edwin Torres is a 64 y.o. male admitted on 09/08/2019 with pneumonia, pleural effusion s/p chest tube with TPA and dornase alpha instillation.  Pharmacy has been consulted for levaquin dosing; note history of anaphylaxis with PCN that required hospitalization.  09/12/19 10:11 AM - D5 Levaquin - WBC improving - afebrile   Plan: Continue Levaquin 750mg  IV q24h  Recommend short duration if possible due to prolonged QTc, repeat EKG, replace K+  Height: 5\' 5"  (165.1 cm) Weight: 58.9 kg (129 lb 13.6 oz) IBW/kg (Calculated) : 61.5  Temp (24hrs), Avg:97.7 F (36.5 C), Min:97.4 F (36.3 C), Max:97.8 F (36.6 C)  Recent Labs  Lab 09/07/19 0805 09/08/19 0851 09/08/19 1047 09/09/19 1125 09/10/19 0235 09/12/19 0340  WBC 23.0 Repeated and verified X2.* 41.7*  --  20.1* 25.6* 17.8*  CREATININE  --  0.83  --  0.59* 0.71 0.60*  LATICACIDVEN  --  5.5* 3.8* 1.6  --   --     Estimated Creatinine Clearance: 76.7 mL/min (A) (by C-G formula based on SCr of 0.6 mg/dL (L)).    Allergies  Allergen Reactions  . Penicillins Anaphylaxis    Has patient had a PCN reaction causing immediate rash, facial/tongue/throat swelling, SOB or lightheadedness with hypotension: Yes Has patient had a PCN reaction causing severe rash involving mucus membranes or skin necrosis: No Has patient had a PCN reaction that required hospitalization: Yes Has patient had a PCN reaction occurring within the last 10 years: No If all of the above answers are "NO", then may proceed with Cephalosporin use.   . Ativan [Lorazepam] Other (See Comments)    Made him crazy    Antimicrobials this admission:  7/15 Vanc x 1 7/15 Levaquin >>  Dose adjustments this admission:   Microbiology results:  7/15 MRSA PCR: neg 7/15 BCx: ngtd 7/15 Pleural fluid: few Strep intermedius, LVQ sens 7/15 Pleural fungal: sent 7/15 UCx:ng-final  Thank you for allowing pharmacy to be a part of this patient's  care.  Ulice Dash, PharmD, BCPS    09/12/2019 10:11 AM

## 2019-09-12 NOTE — Progress Notes (Addendum)
eLink Physician-Brief Progress Note Patient Name: Edwin Torres DOB: [birth year] MRN: [id number]   Date of Service  09/12/2019  HPI/Events of Note  Request for Zofran for nausea On review QTc prolonged last 7/16  eICU Interventions  Ordered compazine     Intervention Category Minor Interventions: Routine modifications to care plan (e.g. PRN medications for pain, fever)  Shona Needles Kristan Votta 09/12/2019, 2:00 AM

## 2019-09-13 ENCOUNTER — Inpatient Hospital Stay (HOSPITAL_COMMUNITY): Payer: PPO

## 2019-09-13 LAB — CULTURE, BLOOD (ROUTINE X 2)
Culture: NO GROWTH
Culture: NO GROWTH
Special Requests: ADEQUATE
Special Requests: ADEQUATE

## 2019-09-13 LAB — CBC
HCT: 27.2 % — ABNORMAL LOW (ref 39.0–52.0)
Hemoglobin: 8.6 g/dL — ABNORMAL LOW (ref 13.0–17.0)
MCH: 27 pg (ref 26.0–34.0)
MCHC: 31.6 g/dL (ref 30.0–36.0)
MCV: 85.5 fL (ref 80.0–100.0)
Platelets: 441 10*3/uL — ABNORMAL HIGH (ref 150–400)
RBC: 3.18 MIL/uL — ABNORMAL LOW (ref 4.22–5.81)
RDW: 15.9 % — ABNORMAL HIGH (ref 11.5–15.5)
WBC: 17 10*3/uL — ABNORMAL HIGH (ref 4.0–10.5)
nRBC: 0 % (ref 0.0–0.2)

## 2019-09-13 MED ORDER — GUAIFENESIN 100 MG/5ML PO SOLN
5.0000 mL | ORAL | Status: DC | PRN
  Administered 2019-09-13: 100 mg via ORAL

## 2019-09-13 NOTE — Progress Notes (Signed)
Pharmacy Antibiotic Note  Edwin Torres is a 64 y.o. male admitted on 09/08/2019 with pneumonia, pleural effusion s/p chest tube with TPA and dornase alpha instillation.  Pharmacy has been consulted for levaquin dosing; note history of anaphylaxis with PCN that required hospitalization.  09/13/19 10:24 AM - QTc monitored for levofloxacin dosing as elevated ona dmission. QTc 489 msec on EKG on 7/19  - WBC improving, trending down  - afebrile    Plan: Continue Levaquin 750mg  IV q24h Follow up renal function & cultures   Height: 5\' 5"  (165.1 cm) Weight: 43.4 kg (95 lb 10.9 oz) (re weighed several times to make sure accurate) IBW/kg (Calculated) : 61.5  Temp (24hrs), Avg:98 F (36.7 C), Min:97.7 F (36.5 C), Max:98.5 F (36.9 C)  Recent Labs  Lab 09/08/19 0851 09/08/19 1047 09/09/19 1125 09/10/19 0235 09/12/19 0340 09/12/19 1040 09/13/19 0250  WBC 41.7*  --  20.1* 25.6* 17.8*  --  17.0*  CREATININE 0.83  --  0.59* 0.71 0.60* 0.56*  --   LATICACIDVEN 5.5* 3.8* 1.6  --   --   --   --     Estimated Creatinine Clearance: 56.5 mL/min (A) (by C-G formula based on SCr of 0.56 mg/dL (L)).    Allergies  Allergen Reactions  . Penicillins Anaphylaxis    Has patient had a PCN reaction causing immediate rash, facial/tongue/throat swelling, SOB or lightheadedness with hypotension: Yes Has patient had a PCN reaction causing severe rash involving mucus membranes or skin necrosis: No Has patient had a PCN reaction that required hospitalization: Yes Has patient had a PCN reaction occurring within the last 10 years: No If all of the above answers are "NO", then may proceed with Cephalosporin use.   . Ativan [Lorazepam] Other (See Comments)    Made him crazy    Antimicrobials this admission:  7/15 Vancomycin  >> 7/16  7/15 Levaquin >>  Dose adjustments this admission:   Microbiology results:  7/15 MRSA PCR: neg 7/15 BCx: NGF 7/15 Pleural fluid: few Strep intermedius, LVQ  sens 7/15 Pleural fungal: sent 7/15 UCx:ng-final  Thank you for allowing pharmacy to be a part of this patient's care.  Royetta Asal, PharmD, BCPS 09/13/2019 10:24 AM

## 2019-09-13 NOTE — TOC Initial Note (Signed)
Transition of Care [facility]) - Initial/Assessment Note    Patient Details  Name: Edwin Torres MRN: [id number] Date of Birth: [birth year]  Transition of [facility]) CM/SW Contact:    Leeroy Cha, RN Phone Number: 09/13/2019, 8:31 AM  Clinical Narrative:                 Admitted with acute resp failure on hfnc at 15l/minj, hx of etoh abouse. Plan to return to home.  Expected Discharge Plan: Home/Self Care Barriers to Discharge: Continued Medical Work up   Patient Goals and CMS Choice Patient states their goals for this hospitalization and ongoing recovery are:: to go home CMS Medicare.gov Compare Post Acute Care list provided to:: Patient    Expected Discharge Plan and Services Expected Discharge Plan: Home/Self Care   Discharge Planning Services: CM Consult   Living arrangements for the past 2 months: Single Family Home                                      Prior Living Arrangements/Services Living arrangements for the past 2 months: Single Family Home Lives with:: Spouse Patient language and need for interpreter reviewed:: Yes Do you feel safe going back to the place where you live?: Yes      Need for Family Participation in Patient Care: Yes (Comment) Care giver support system in place?: Yes (comment)   Criminal Activity/Legal Involvement Pertinent to Current Situation/Hospitalization: No - Comment as needed  Activities of Daily Living Home Assistive Devices/Equipment: Walker (specify type) ADL Screening (condition at time of admission) Patient's cognitive ability adequate to safely complete daily activities?: Yes Is the patient deaf or have difficulty hearing?: No Does the patient have difficulty seeing, even when wearing glasses/contacts?: No Does the patient have difficulty concentrating, remembering, or making decisions?: Yes Patient able to express need for assistance with ADLs?: Yes Does the patient have difficulty dressing or bathing?:  Yes Independently performs ADLs?: No (secondary to shortness of breath and weakness) Communication: Independent Dressing (OT): Needs assistance Is this a change from baseline?: Change from baseline, expected to last >3 days Grooming: Needs assistance Is this a change from baseline?: Change from baseline, expected to last >3 days Feeding: Needs assistance Is this a change from baseline?: Change from baseline, expected to last >3 days Bathing: Needs assistance Is this a change from baseline?: Change from baseline, expected to last >3 days Toileting: Needs assistance Is this a change from baseline?: Change from baseline, expected to last >3days In/Out Bed: Needs assistance Is this a change from baseline?: Change from baseline, expected to last >3 days Walks in Home: Needs assistance Is this a change from baseline?: Change from baseline, expected to last >3 days Does the patient have difficulty walking or climbing stairs?: Yes (secondary to weakness and shortness of breath) Weakness of Legs: Both Weakness of Arms/Hands: Both  Permission Sought/Granted                  Emotional Assessment Appearance:: Appears stated age     Orientation: : Oriented to Self, Oriented to Place, Oriented to  Time, Oriented to Situation Alcohol / Substance Use: Not Applicable Psych Involvement: No (comment)  Admission diagnosis:  S/P thoracentesis [Z98.890] Acute hypoxemic respiratory failure ([place]) [J96.01] Community acquired pneumonia, unspecified laterality [J18.9] Patient Active Problem List   Diagnosis Date Noted  . CAP (community acquired pneumonia) 09/11/2019  . Acute hypoxemic respiratory failure (  [place]) 09/08/2019  . Chest tube in place   . Empyema ([place])   . Patient non-compliant, refuses any preventative care measures 04/04/2019  . Hypomagnesemia 03/10/2019  . Dysphagia 03/04/2019  . Moderate Neurocognitive deficits 03/04/2019  . Hypokalemia 02/24/2019  . Somnolence, daytime  02/24/2019  . Alcohol abuse with intoxication ([place]) 02/22/2019  . Frequent falls 02/22/2019  . Macrocytic anemia 02/22/2019  . Tobacco abuse 02/22/2019  . Physical deconditioning 02/22/2019  . Thrombocytopenia ([place]) 02/22/2019  . Weakness of right third cranial nerve 03/21/2016   PCP:  Inda Coke, PA Pharmacy:   [facility] [place], [place] - [address] [place] [address] Phone: [phone] Fax: [phone]     Social Determinants of Health (SDOH) Interventions    Readmission Risk Interventions No flowsheet data found.

## 2019-09-13 NOTE — Progress Notes (Addendum)
NAME:  Edwin Torres, MRN:  [id number], DOB:  07-23-1955, LOS: 5 ADMISSION DATE:  09/08/2019, CONSULTATION DATE:  09/08/2019 REFERRING MD:  Dr Milton Ferguson, CHIEF COMPLAINT:  SOB   Brief History   64 year old with history of depression, alcohol use, seizures and poor dentition admitted with community-acquired pneumonia and empyema s/p chest tube placement and TPA.  History of 3rd nerve palsy dates back to 2018 as per notes from PCP office    Past Medical History  ETOH Abuse  Melanoma - on face, s/p removal  Depression  Genital Herpes  Seizures Right eye ptosis - 3rd nerve palsy, dates back to [facility]   7/15 Admit, thoracentesis, chest tube placement with TPA 7/19 Pain at ct site / not able to wean off high flow 02  Consults:  PCCM  Procedures:  Thoracentesis 09/08/2019 significant for 250 cc of cloudy fluid R lytic rx  7/15 R Lytic rx 7/16   Significant Diagnostic Tests:  Pleural fluid from 7/15  Exudative with glucsoe <20   And wbc 97170 with 95% Polys  Micro Data:  COVID 19 PCR >> Neg  MRSA PCR  7/15 >> neg Pleural fluid culture 7/15 >> strep intermedius >> sensitive to levaquin  UC 7/15 >> negative  BCx2  7/15 >> negative  Antimicrobials:  Vancomycin 7/15 >> 7/17 Levofloxacin  7/15 >>   Interim history/subjective:  Afebrile  O2 down to [place] O2  Chest tube drainage > ~79ml noted in chamber from last marking by staff Pt denies pain, SOB  Objective   Blood pressure 124/60, pulse 80, temperature 98.5 F (36.9 C), temperature source Oral, resp. rate 18, height 5\' 5"  (1.651 m), weight 43.4 kg, SpO2 100 %.        Intake/Output Summary (Last 24 hours) at 09/13/2019 0930 Last data filed at 09/13/2019 3500 Gross per 24 hour  Intake 846.27 ml  Output 375 ml  Net 471.27 ml   Filed Weights   09/11/19 0500 09/12/19 0500 09/13/19 0500  Weight: 55.8 kg 58.9 kg 43.4 kg    Examination: General: adult male lying in bed in NAD  HEENT: MM  pink/moist, wearing glasses, R eye ptosis  Neuro: AAOx4, speech clear, MAE  CV: s1s2 RRR, no m/r/g PULM: non-labored on [place] O2, lungs bilaterally with crackles  GI: soft, bsx4 active  Extremities: warm/dry, no edema  Skin: no rashes or lesions  Resolved Hospital Problem list   Sepsis   Assessment & Plan:   Strep Intermedius Community-acquired Pneumonia with Empyema   Acute Hypoxemic Respiratory Failure Acute Lung Injury from Sepsis  S/p intrapleural tPA, DNase 7/15, 7/16.  No significant effusion on CXR 7/17 -residual hydropneumothorax on CXR 7/20 -change chest tube to water seal, follow up CT in am  -levaquin D6/x  -repeat CT imaging in am to assess empyema / PTX -may need TCTS evaluation  -CT care per protocol  -wean O2 for sats 88-95%  Hyponatremia Likely related to CAP -follow Na trend, improved  -per primary   Hypoglycemia  -per primary   Tobacco Abuse  -smoking cessation counseling   Aalcohol Abuse -monitor for withdrawal    Right Eyelid Ptosis Per primary care note this has been a chronic issue since 2018.  Recent CT head was normal. He has been referred to neurology as an outpatient. -no acute interventions   Best practice:  Diet: Regular DVT prophylaxis: Lovenox Mobility: Bedrest Code Status: Full code Family Communication: Patient updated on plan of care 7/20 Disposition:  SDU, per TRH.  Likely can transition out of SDU.       Noe Gens, MSN, NP-C [facility] 09/13/2019, 9:30 AM   Please see [email or web address] for pager details.

## 2019-09-13 NOTE — Progress Notes (Addendum)
PROGRESS NOTE    Edwin Torres  VOH:[id number] DOB: 23-Mar-1955 DOA: 09/08/2019 PCP: Inda Coke, PA   Brief Narrative:  64 year old with history of depression, alcohol use, seizures, 3rd nerve palsy with chronic chronic R eye ptosis, melanoma of the face s/p removal, and poor dentition admitted with community-acquired strep pneumo pneumonia with notable empyema s/p right sided chest tube placement and TPA in ICU - respiratory status continues to improve. Patient transferred to hospitalist service, PCCM will continue to manage the chest tube.   Assessment & Plan:   Active Problems:   Acute hypoxemic respiratory failure (HCC)   Chest tube in place   Empyema ([place])   CAP (community acquired pneumonia)   Sepsis secondary to strep intermedius community-acquired Pneumonia with Empyema, POA   Acute Hypoxemic Respiratory Failure Acute Lung Injury from Sepsis  - Status post intrapleural tPA, DNase 7/15, 7/16 - CXR 7/20 no overt change on personal read, small right effusion, without overt pneumothorax - Continue levaquin, likely benefit from prolonged therapy given loculated empyema quiring chest tube - Chest tube management per PCCM - we appreciate insight and assistance; continues to output blood-tinged fluid - Continues to require high flow oxygen, wean as tolerated -weaned to 5 L this morning on [place]  Hyponatremia, hypovolemic resolved - Likely related to CAP/in the setting of poor p.o. intake -Follow repeat labs  Tobacco Abuse  - Smoking cessation counseling  Alcohol Abuse - No evidence of withdrawal, monitor closely   Right Eyelid Ptosis - Chronic issue since 2018 per chart review.  Recent CT head was normal. He has been referred to neurology as an outpatient. -no acute interventions indicated  DVT prophylaxis: lovenox Code Status: Full Family Communication: None present  Status is: Inpatient  Dispo: The patient is from: Home              Anticipated d/c is to:  To be determined              Anticipated d/c date is: 72 to 96 hours pending clinical course, chest tube removal and physical therapy evaluation given prolonged bedbound status.              Patient currently not medically stable for discharge  Consultants:   PCCM  Procedures:  Thoracentesis 09/08/2019 significant for 250 cc of cloudy fluid R lytic rx  7/15 R Lytic rx 7/16   Antimicrobials:  Vancomycin 7/15 >> 7/17 Levofloxacin  7/15 >>  ongoing  Subjective: No acute issues or events reported overnight, right sided chest pain in the location of chest tube insertion site ongoing but well controlled on current regimen.  Patient denies fevers, chills, nausea, vomiting, diarrhea, constipation.  Objective: Vitals:   09/13/19 0400 09/13/19 0500 09/13/19 0600 09/13/19 0741  BP: 133/81  124/60   Pulse: 73  72 80  Resp: (!) 24  19 18   Temp: 98.3 F (36.8 C)   98.5 F (36.9 C)  TempSrc: Oral   Oral  SpO2: 99%  100% 100%  Weight:  43.4 kg    Height:        Intake/Output Summary (Last 24 hours) at 09/13/2019 1148 Last data filed at 09/13/2019 0616 Gross per 24 hour  Intake 846.27 ml  Output 375 ml  Net 471.27 ml   Filed Weights   09/11/19 0500 09/12/19 0500 09/13/19 0500  Weight: 55.8 kg 58.9 kg 43.4 kg    Examination:  General:  Pleasantly resting in bed, No acute distress. HEENT:  Normocephalic atraumatic.  Sclerae  nonicteric, noninjected.  Extraocular movements intact bilaterally. Neck:  Without mass or deformity.  Trachea is midline. Lungs: Right-sided chest tube draining blood-tinged fluid, bandage clean dry intact.  Diminished breath sounds over the left anterior chest wall without overt rhonchi, wheeze, or rales. Heart:  Regular rate and rhythm.  Without murmurs, rubs, or gallops. Abdomen:  Soft, nontender, nondistended.  Without guarding or rebound. Extremities: Without cyanosis, clubbing, edema, or obvious deformity. Vascular:  Dorsalis pedis and posterior tibial  pulses palpable bilaterally. Skin:  Warm and dry, no erythema, no ulcerations.   Data Reviewed: I have personally reviewed following labs and imaging studies  CBC: Recent Labs  Lab 09/07/19 0805 09/07/19 0805 09/08/19 0851 09/09/19 1125 09/10/19 0235 09/12/19 0340 09/13/19 0250  WBC 23.0 Repeated and verified X2.*   < > 41.7* 20.1* 25.6* 17.8* 17.0*  NEUTROABS 17.9*  --  37.9*  --   --   --   --   HGB 12.0*   < > 12.9* 10.1* 9.8* 9.0* 8.6*  HCT 35.8*   < > 37.8* 30.6* 29.4* 27.8* 27.2*  MCV 82.4   < > 80.8 81.6 81.4 84.2 85.5  PLT 629.0*   < > 675* 481* 546* 588* 441*   < > = values in this interval not displayed.   Basic Metabolic Panel: Recent Labs  Lab 09/08/19 0851 09/09/19 1125 09/10/19 0235 09/12/19 0340 09/12/19 1040  NA 126* 130* 132* 135 136  K 2.6* 3.7 3.8 3.6 3.5  CL 88* 97* 98 99 99  CO2 19* 23 21* 22 24  GLUCOSE 244* 173* 134* 66* 62*  BUN 11 12 22 17 16   CREATININE 0.83 0.59* 0.71 0.60* 0.56*  CALCIUM 8.1* 7.4* 8.1* 7.9* 8.0*  MG  --  2.1 2.1  --   --   PHOS  --  3.6 4.8*  --   --    GFR: Estimated Creatinine Clearance: 56.5 mL/min (A) (by C-G formula based on SCr of 0.56 mg/dL (L)). Liver Function Tests: Recent Labs  Lab 09/08/19 0851 09/09/19 1125 09/10/19 0235  AST 37 28 27  ALT 18 15 16   ALKPHOS 130* 77 75  BILITOT 0.9 1.7* 0.9  PROT 7.2 5.3* 5.7*  ALBUMIN 2.5* 1.9* 2.2*   No results for input(s): LIPASE, AMYLASE in the last 168 hours. No results for input(s): AMMONIA in the last 168 hours. Coagulation Profile: Recent Labs  Lab 09/08/19 0851  INR 1.5*   Cardiac Enzymes: No results for input(s): CKTOTAL, CKMB, CKMBINDEX, TROPONINI in the last 168 hours. BNP (last 3 results) No results for input(s): PROBNP in the last 8760 hours. HbA1C: No results for input(s): HGBA1C in the last 72 hours. CBG: No results for input(s): GLUCAP in the last 168 hours. Lipid Profile: No results for input(s): CHOL, HDL, LDLCALC, TRIG, CHOLHDL,  LDLDIRECT in the last 72 hours. Thyroid Function Tests: No results for input(s): TSH, T4TOTAL, FREET4, T3FREE, THYROIDAB in the last 72 hours. Anemia Panel: No results for input(s): VITAMINB12, FOLATE, FERRITIN, TIBC, IRON, RETICCTPCT in the last 72 hours. Sepsis Labs: Recent Labs  Lab 09/08/19 0851 09/08/19 1047 09/09/19 1125  LATICACIDVEN 5.5* 3.8* 1.6    Recent Results (from the past 240 hour(s))  SARS Coronavirus 2 by RT PCR (hospital order, performed in [facility] hospital lab) Nasopharyngeal Nasopharyngeal Swab     Status: None   Collection Time: 09/08/19  8:51 AM   Specimen: Nasopharyngeal Swab  Result Value Ref Range Status   SARS Coronavirus 2 NEGATIVE NEGATIVE  Final    Comment: (NOTE) SARS-CoV-2 target nucleic acids are NOT DETECTED.  The SARS-CoV-2 RNA is generally detectable in upper and lower respiratory specimens during the acute phase of infection. The lowest concentration of SARS-CoV-2 viral copies this assay can detect is 250 copies / mL. A negative result does not preclude SARS-CoV-2 infection and should not be used as the sole basis for treatment or other patient management decisions.  A negative result may occur with improper specimen collection / handling, submission of specimen other than nasopharyngeal swab, presence of viral mutation(s) within the areas targeted by this assay, and inadequate number of viral copies (<250 copies / mL). A negative result must be combined with clinical observations, patient history, and epidemiological information.  Fact Sheet for Patients:   StrictlyIdeas.no  Fact Sheet for Healthcare Providers: BankingDealers.co.za  This test is not yet approved or  cleared by the [place] FDA and has been authorized for detection and/or diagnosis of SARS-CoV-2 by FDA under an Emergency Use Authorization (EUA).  This EUA will remain in effect (meaning this test can be used) for the  duration of the COVID-19 declaration under Section 564(b)(1) of the Act, 21 U.S.C. section 360bbb-3(b)(1), unless the authorization is terminated or revoked sooner.  Performed at [facility], [place] [address], [place], [place] [address]   Blood Culture (routine x 2)     Status: None   Collection Time: 09/08/19  8:51 AM   Specimen: BLOOD  Result Value Ref Range Status   Specimen Description   Final    BLOOD LEFT ANTECUBITAL Performed at [facility] [address], [place], [place] [address]    Special Requests   Final    BOTTLES DRAWN AEROBIC AND ANAEROBIC Blood Culture adequate volume Performed at [facility] [address], [place], [place] [address]    Culture   Final    NO GROWTH 5 DAYS Performed at [facility], [place] [address] [place], [place] [address]    Report Status 09/13/2019 FINAL  Final  Blood Culture (routine x 2)     Status: None   Collection Time: 09/08/19  8:51 AM   Specimen: BLOOD  Result Value Ref Range Status   Specimen Description   Final    BLOOD BLOOD RIGHT FOREARM Performed at [facility] [address], [place], [place] [address]    Special Requests   Final    BOTTLES DRAWN AEROBIC AND ANAEROBIC Blood Culture adequate volume Performed at [facility] [address], [place], [place] [address]    Culture   Final    NO GROWTH 5 DAYS Performed at [facility], [place] [address], [place], [place] [address]    Report Status 09/13/2019 FINAL  Final  Body fluid culture (includes gram stain)     Status: None   Collection Time: 09/08/19 10:54 AM   Specimen: Pleural Fluid  Result Value Ref Range Status   Specimen Description   Final    PLEURAL Performed at [facility] [address], [place], [place] [address]    Special Requests   Final    NONE Performed at [facility], [place] [address],  [place], [place] [address]    Gram Stain   Final    ABUNDANT WBC PRESENT, PREDOMINANTLY PMN RARE GRAM POSITIVE COCCI IN CHAINS Performed at [facility], [place] [address], [place], [place] [address]    Culture FEW  STREPTOCOCCUS INTERMEDIUS  Final   Report Status 09/10/2019 FINAL  Final   Organism ID, Bacteria STREPTOCOCCUS INTERMEDIUS  Final      Susceptibility   Streptococcus intermedius - MIC*    PENICILLIN <=0.06 SENSITIVE Sensitive     CEFTRIAXONE 0.25 SENSITIVE Sensitive     ERYTHROMYCIN >=8 RESISTANT Resistant     LEVOFLOXACIN 1 SENSITIVE Sensitive     VANCOMYCIN 0.5 SENSITIVE Sensitive     * FEW STREPTOCOCCUS INTERMEDIUS  Urine culture     Status: None   Collection Time: 09/08/19  1:03 PM   Specimen: In/Out Cath Urine  Result Value Ref Range Status   Specimen Description   Final    IN/OUT CATH URINE Performed at [facility] [address], [place], [place] [address]    Special Requests   Final    NONE Performed at [facility], [place] [address], [place], [place] [address]    Culture   Final    NO GROWTH Performed at [facility], [place] [address] [place], [place] [address]    Report Status 09/09/2019 FINAL  Final  MRSA PCR Screening     Status: None   Collection Time: 09/08/19  4:15 PM   Specimen: Nasal Mucosa; Nasopharyngeal  Result Value Ref Range Status   MRSA by PCR NEGATIVE NEGATIVE Final    Comment:        The GeneXpert MRSA Assay (FDA approved for NASAL specimens only), is one component of a comprehensive MRSA colonization surveillance program. It is not intended to diagnose MRSA infection nor to guide or monitor treatment for MRSA infections. Performed at [facility], [place] [address] [place],  [address]      Radiology Studies: DG CHEST PORT 1 VIEW  Result Date: 09/13/2019 CLINICAL DATA:  Shortness of breath EXAM: PORTABLE CHEST 1 VIEW COMPARISON:  September 12, 2019 FINDINGS: The  cardiomediastinal silhouette is unchanged in contour. Small right pleural effusion with a flattened appearance likely reflecting a small basilar pneumothorax component. Right chest tube in place. No significant left pneumothorax. Persistent interstitial prominence diffusely. Right basilar heterogeneous opacities, likely atelectasis. Visualized abdomen is unremarkable. Dextrocurvature of the thoracic spine with multilevel degenerative changes of the thoracic spine. IMPRESSION: 1. Small right hydropneumothorax with right chest tube in place, not significantly changed. 2.  Mild pulmonary edema with right basilar atelectasis. Electronically Signed   By: Valentino Saxon MD   On: 09/13/2019 08:28   DG CHEST PORT 1 VIEW  Result Date: 09/12/2019 CLINICAL DATA:  Empyema, chest tube EXAM: PORTABLE CHEST 1 VIEW COMPARISON:  09/10/2019 FINDINGS: No significant change in AP portable examination. Right-sided chest tube remains in position without significant pneumothorax appreciated. There is unchanged diffuse interstitial pulmonary opacity bilaterally and small, right greater than left pleural effusions. No new airspace opacity. The heart is normal in size. IMPRESSION: 1. No significant change in AP portable examination. Right-sided chest tube remains in position without significant pneumothorax appreciated. 2. There is unchanged diffuse interstitial pulmonary opacity bilaterally and small, right greater than left pleural effusions. 3.  No new airspace opacity. Electronically Signed   By: [name] Candle M.D.   On: 09/12/2019 10:43   Scheduled Meds: . acetaminophen  1,000 mg Oral TID  . Chlorhexidine Gluconate Cloth  6 each Topical Daily  . enoxaparin (LOVENOX) injection  40 mg Subcutaneous Q24H  . mouth rinse  15 mL Mouth Rinse BID  . nicotine  21 mg Transdermal Daily   Continuous Infusions: .  levofloxacin (LEVAQUIN) IV 750 mg (09/13/19 1017)    LOS: 5 days   Time spent: 40 min  Little Ishikawa,  DO [facility]  If 7PM-7AM, please contact night-coverage [email or web address]  09/13/2019, 11:48 AM

## 2019-09-14 ENCOUNTER — Inpatient Hospital Stay (HOSPITAL_COMMUNITY): Payer: PPO

## 2019-09-14 DIAGNOSIS — E440 Moderate protein-calorie malnutrition: Secondary | ICD-10-CM | POA: Insufficient documentation

## 2019-09-14 DIAGNOSIS — R6520 Severe sepsis without septic shock: Secondary | ICD-10-CM

## 2019-09-14 DIAGNOSIS — A408 Other streptococcal sepsis: Principal | ICD-10-CM

## 2019-09-14 DIAGNOSIS — A419 Sepsis, unspecified organism: Secondary | ICD-10-CM

## 2019-09-14 LAB — BASIC METABOLIC PANEL
Anion gap: 10 (ref 5–15)
BUN: 6 mg/dL — ABNORMAL LOW (ref 8–23)
CO2: 26 mmol/L (ref 22–32)
Calcium: 7.8 mg/dL — ABNORMAL LOW (ref 8.9–10.3)
Chloride: 98 mmol/L (ref 98–111)
Creatinine, Ser: 0.42 mg/dL — ABNORMAL LOW (ref 0.61–1.24)
GFR calc Af Amer: >60 mL/min (ref >60)
GFR calc non Af Amer: >60 mL/min (ref >60)
Glucose, Bld: 100 mg/dL — ABNORMAL HIGH (ref 70–99)
Potassium: 3.8 mmol/L (ref 3.5–5.1)
Sodium: 134 mmol/L — ABNORMAL LOW (ref 135–145)

## 2019-09-14 LAB — CBC
HCT: 26.4 % — ABNORMAL LOW (ref 39.0–52.0)
Hemoglobin: 8.4 g/dL — ABNORMAL LOW (ref 13.0–17.0)
MCH: 26.7 pg (ref 26.0–34.0)
MCHC: 31.8 g/dL (ref 30.0–36.0)
MCV: 83.8 fL (ref 80.0–100.0)
Platelets: 502 10*3/uL — ABNORMAL HIGH (ref 150–400)
RBC: 3.15 MIL/uL — ABNORMAL LOW (ref 4.22–5.81)
RDW: 15.9 % — ABNORMAL HIGH (ref 11.5–15.5)
WBC: 16 10*3/uL — ABNORMAL HIGH (ref 4.0–10.5)
nRBC: 0 % (ref 0.0–0.2)

## 2019-09-14 MED ORDER — ADULT MULTIVITAMIN W/MINERALS CH
1.0000 | ORAL_TABLET | Freq: Every day | ORAL | Status: DC
  Administered 2019-09-14 – 2019-09-21 (×9): 1 via ORAL
  Filled 2019-09-14 (×9): qty 1

## 2019-09-14 MED ORDER — LEVOFLOXACIN 500 MG PO TABS
750.0000 mg | ORAL_TABLET | Freq: Every day | ORAL | Status: DC
  Administered 2019-09-15 – 2019-09-22 (×8): 750 mg via ORAL
  Filled 2019-09-14 (×8): qty 2

## 2019-09-14 MED ORDER — BOOST / RESOURCE BREEZE PO LIQD CUSTOM
1.0000 | ORAL | Status: DC
  Administered 2019-09-14 – 2019-09-21 (×6): 1 via ORAL

## 2019-09-14 MED ORDER — ENSURE ENLIVE PO LIQD
237.0000 mL | Freq: Two times a day (BID) | ORAL | Status: DC
  Administered 2019-09-14 – 2019-09-22 (×16): 237 mL via ORAL

## 2019-09-14 MED ORDER — GABAPENTIN 100 MG PO CAPS
100.0000 mg | ORAL_CAPSULE | Freq: Every day | ORAL | Status: DC
  Administered 2019-09-14 – 2019-09-21 (×8): 100 mg via ORAL
  Filled 2019-09-14 (×8): qty 1

## 2019-09-14 MED ORDER — FUROSEMIDE 10 MG/ML IJ SOLN
40.0000 mg | Freq: Once | INTRAMUSCULAR | Status: AC
  Administered 2019-09-14: 40 mg via INTRAVENOUS
  Filled 2019-09-14: qty 4

## 2019-09-14 NOTE — Progress Notes (Signed)
   NAME:  Edwin Torres, MRN:  [id number], DOB:  09-Jul-1955, LOS: 6 ADMISSION DATE:  09/08/2019, CONSULTATION DATE:  09/08/2019 REFERRING MD:  Dr Milton Ferguson, CHIEF COMPLAINT:  SOB   Brief History   64 year old with history of depression, alcohol use, seizures and poor dentition admitted with community-acquired pneumonia and empyema s/p chest tube placement and TPA.  History of 3rd nerve palsy dates back to 2018 as per notes from PCP office    Past Medical History  ETOH Abuse  Melanoma - on face, s/p removal  Depression  Genital Herpes  Seizures Right eye ptosis - 3rd nerve palsy, dates back to [place] [facility]   7/15 Admit, thoracentesis, chest tube placement with TPA 7/19 Pain at ct site / not able to wean off high flow 02 7/20 Chest Tube Placed to Water Seal   Consults:  PCCM  Procedures:  Thoracentesis 09/08/2019 significant for 250 cc of cloudy fluid R lytic rx  7/15 R Lytic rx 7/16   Significant Diagnostic Tests:  Pleural fluid from 7/15  Exudative with glucsoe <20   And wbc 97170 with 95% Polys  Micro Data:  COVID 19 PCR > Neg  MRSA PCR  7/15 > neg Pleural fluid culture 7/15 > strep intermedius > sensitive to levaquin  UC 7/15 > negative  BCx2  7/15 > negative  Antimicrobials:  Vancomycin 7/15 > 7/17 Levofloxacin  7/15 >>   Interim history/subjective:  Reports improvement in breathing. Denies Pain/Nausea.  Last 24 hours with 200 ml out from chest tube.   Objective   Blood pressure 132/64, pulse 73, temperature 98.4 F (36.9 C), temperature source Oral, resp. rate 20, height 5\' 5"  (1.651 m), weight 44.4 kg, SpO2 92 %.        Intake/Output Summary (Last 24 hours) at 09/14/2019 0915 Last data filed at 09/14/2019 0757 Gross per 24 hour  Intake 1231.67 ml  Output 1675 ml  Net -443.33 ml   Filed Weights   09/12/19 0500 09/13/19 0500 09/14/19 0500  Weight: 58.9 kg 43.4 kg 44.4 kg    Examination: General: adult male lying in bed in NAD    HEENT: MM pink/moist, R eye ptosis  Neuro: Alert, oriented, follows commands  CV: RRR, no MRG PULM: Coarse breath sounds, crackles to bases, no accessory muscle use, chest tube in place to right lateral   GI: soft, non-tender, active bowel sounds  Extremities: -edema  Skin: warm, dry, intact   Resolved Hospital Problem list   Sepsis   Assessment & Plan:   Acute Hypoxemic Respiratory Failure in setting of Strep Intermedius Community-acquired Pneumonia with Empyema  and Acute Lung Injury from Sepsis  -S/p intrapleural tPA, DNase 7/15, 7/16.  No significant effusion on CXR 7/17 -residual hydropneumothorax on CXR 7/20 Plan -Chest Tube Remains on Water Seal, Routine Dressing Changes   -Obtain CT Chest Today > CT Surgery Evaluation vs Repeat tPA -levaquin D7/x  -Currently on 5L [place] > Titrate for Saturation Goal 88-92% -Give Lasix 40 mg   Remaining Management Per Primary.   Best practice:  Diet: Regular DVT prophylaxis: Lovenox Mobility: Bedrest Code Status: Full code Family Communication: Patient updated on plan of care 7/20 Disposition: SDU, per TRH.  Can Transfer out of ICU.     Hayden Pedro, AGACNP-BC [facility]  Pgr: [phone]  PCCM Pgr: [phone]

## 2019-09-14 NOTE — Progress Notes (Signed)
Initial Nutrition Assessment  DOCUMENTATION CODES:   Non-severe (moderate) malnutrition in context of social or environmental circumstances, Underweight  INTERVENTION:  - will order Boost Breeze once/day, each supplement provides 250 kcal and 9 grams of protein. - will order Ensure Enlive BID, each supplement provides 350 kcal and 20 grams of protein. - will order 1 tablet multivitamin with minerals/day.  NUTRITION DIAGNOSIS:   Moderate Malnutrition related to social / environmental circumstances (alcohol abuse) as evidenced by mild fat depletion, moderate fat depletion, moderate muscle depletion, severe muscle depletion.  GOAL:   Patient will meet greater than or equal to 90% of their needs  MONITOR:   PO intake, Supplement acceptance, Labs, Weight trends  REASON FOR ASSESSMENT:   Other (Comment) (underweight BMI)  ASSESSMENT:   64 year old man medical history of alcohol abuse, seizure disorder, 3rd nerve palsy with chronic R eye ptosis, and poor dentition. He presented to the ED with shortness of breath and was admitted for acute hypoxic respiratory failure 2/2 PNA and empyema. S/p R chest tube.  Limited meal intakes documented in the chart: 100% of dinner 7/19 and 20% of dinner 7/20. Patient laying in bed with eyes closed but opens L eye to name call x1. No family/visitors present.   Patient lives with his wife. At home he has a fair appetite and typically snacks and eats 1 meal/day. Documentation of poor dentition; able to visualize this. He denies any difficulty or pain with chewing related to dentition and feels that he has done fine with all items he has ordered this admission.  He did not order breakfast this AM and requested that lunch order be placed for chicken Caesar salad and sweet tea.  Weight today is 98 lb and weight on 7/17 was 123 lb. Flow sheet indicates mild edema to BLE.   Per notes: - sepsis 2/2 CAP and empyema--sepsis resolved - plan for outpatient CT  chest to determine resolution of PNA and r/o underlying malignancy - hx alcohol abuse with  No indication of withdrawal - possible need for thoracic surgery c/s  Labs reviewed; Na: 134 mmol/l, BUN: 6 mg/dl, creatinine: 0.42 mg/dl, Ca: 7.8 mg/dl. Medications reviewed; 40 mg IV lasix x1 dose 7/21.     NUTRITION - FOCUSED PHYSICAL EXAM:    Most Recent Value  Orbital Region Mild depletion  Upper Arm Region Moderate depletion  Thoracic and Lumbar Region Unable to assess  Buccal Region Mild depletion  Temple Region Mild depletion  Clavicle Bone Region Severe depletion  Clavicle and Acromion Bone Region Severe depletion  Scapular Bone Region Unable to assess  Dorsal Hand Moderate depletion  Patellar Region Moderate depletion  Anterior Thigh Region Moderate depletion  Posterior Calf Region Moderate depletion  Edema (RD Assessment) None  Hair Reviewed  Eyes Reviewed  [chronic R eye ptosis]  Mouth Reviewed  Skin Reviewed  Nails Reviewed       Diet Order:   Diet Order            Diet regular Room service appropriate? Yes; Fluid consistency: Thin  Diet effective now                 EDUCATION NEEDS:   No education needs have been identified at this time  Skin:  Skin Assessment: Reviewed RN Assessment  Last BM:  7/15  Height:   Ht Readings from Last 1 Encounters:  09/08/19 5\' 5"  (1.651 m)    Weight:   Wt Readings from Last 1 Encounters:  09/14/19 44.4 kg  Estimated Nutritional Needs:  Kcal:  1730-1980 kcal (28-32 kcal/kg IBW) Protein:  90-105 grams Fluid:  >/= 1.9 L/day     Jarome Matin, MS, RD, LDN, CNSC Inpatient Clinical Dietitian RD pager # available in AMION  After hours/weekend pager # available in [facility]

## 2019-09-14 NOTE — Progress Notes (Signed)
PHARMACIST - PHYSICIAN COMMUNICATION  CONCERNING: Antibiotic IV to Oral Route Change Policy  RECOMMENDATION: This patient is receiving levaquin (levofloxacin) by the intravenous route.  Based on criteria approved by the Pharmacy and Therapeutics Committee, the antibiotic(s) is/are being converted to the equivalent oral dose form(s).   DESCRIPTION: These criteria include:  Patient being treated for a respiratory tract infection, urinary tract infection, cellulitis or clostridium difficile associated diarrhea if on metronidazole  The patient is not neutropenic and does not exhibit a GI malabsorption state  The patient is eating (either orally or via tube) and/or has been taking other orally administered medications for a least 24 hours  The patient is improving clinically and has a Tmax < 100.5  If you have questions about this conversion, please contact the Pharmacy Department  []   [phone] )  [facility] []   [phone] )  [facility] []   [phone] )  [facility] []   [phone] )  [facility] [x]   [phone] )  [facility]

## 2019-09-14 NOTE — Progress Notes (Signed)
Edwin Torres, Edwin DOA: 09/08/2019 PCP: Inda Coke, Edwin Torres  Brief History   64 year old man PMH including alcohol use, seizure disorder, 3rd nerve palsy with chronic right eye ptosis, poor dentition, presented with shortness of breath.  Admitted for acute hypoxic respiratory failure secondary to pneumonia, empyema.  Status post right chest tube.  Continue management per PCCM.  A & P  Sepsis secondary to Strep CAP w/ empyema present on admission with associated acute hypoxic respiratory failure  --Sepsis symptomatology resolved BiPAP but stable on nasal cannula. --Hemodynamics stable.  Oxygenation improved, down to 5 L --Continue Levaquin --Continue management per pulmonology.  Status post TPA 7/15, 7/16 --Chest CT today per pulmonology with further recommendations to follow. --Follow-up with dental medicine as an outpatient --Will need outpatient chest CT to follow-up resolution of pneumonia and can exclude underlying malignancy.  Normocytic anemia  --stable, secondary to chronic disease, f/u as an outpatient   Alcohol abuse --No evidence of withdrawal  Cigarette smoker --Cessation recommended  Chronic right eyelid ptosis --Follow-up as an outpatient  Hypovolemic hyponatremia  --nearly resolved  PMH seizure --Continue gabapentin  Disposition Plan:  Discussion: Oxygen requirement appears stable on 5 L.  Continue chest tube management per pulmonology.  Repeat CT planned.  May need thoracic surgery consultation.  Defer management to pulmonology.  No opportunity for discharge until chest tube removed.  Status is: Inpatient  Remains inpatient appropriate because:Inpatient level of care appropriate due to severity of illness   Dispo: The patient is from: Home              Anticipated d/c is to: Home              Anticipated d/c date is: 2 days              Patient currently is not medically stable to d/c.  DVT prophylaxis:   enoxaparin (LOVENOX) injection 40 mg Start: 09/08/19 1100 SCDs Start: 09/08/19 1056 Code Status: Full Family Communication: none present or requested  Murray Hodgkins, MD  [facility] Direct contact: see [email or web address] (further directions at bottom of note if needed) 7PM-7AM contact night coverage as at bottom of note 09/14/2019, 10:21 AM  LOS: 6 days   Significant Hospital Events   7/15 Admit, thoracentesis, chest tube placement with TPA 7/19 Pain at ct site / not able to wean off high flow 02 7/20 Chest Tube Placed to Water Seal    Consults:  . PCCM admitted 7/15, transferred care to [facility] 7/20    Procedures:  Thoracentesis 09/08/2019 significant for 250 cc of cloudy fluid R lytic rx  7/15 R Lytic rx 7/16   Significant Diagnostic Tests:  Pleural fluid from 7/15  Exudative with glucose <20   And wbc 97170 with 95% Polys  Micro Data:  COVID 19 PCR > Neg  MRSA PCR  7/15 > neg Pleural fluid culture 7/15 > strep intermedius > sensitive to levaquin  UC 7/15 > negative  BCx2  7/15 > negative  Antimicrobials:  Vancomycin 7/15 > 7/17 Levofloxacin  7/15 >>   Interval History/Subjective  Feels okay, breathing ok, eating ok, no complaints.  Objective   Vitals:  Vitals:   09/14/19 0600 09/14/19 0800  BP: 128/65 132/64  Pulse: 71 73  Resp: 18 20  Temp:  98.4 F (36.9 C)  SpO2: 96% 92%    Exam:  Constitutional:   . Appears calm and comfortable, weak, ill, nontoxic ENMT:  . grossly normal hearing  Respiratory:  . CTA on left, no w/r/r. Diminished breath sounds on the right . Respiratory effort mildly increased Cardiovascular:  . RRR, no m/r/g . No LE extremity edema   . Telemetry SR Abdomen:  . soft Psychiatric:  . Mental status o Mood, affect appropriate  I have personally reviewed the following:   Today's Data  . BMP unremarkable . WBC w/o sig change, 16.0 . hgb stable at 8.4  Scheduled Meds: . acetaminophen  1,000 mg Oral TID  . Chlorhexidine  Gluconate Cloth  6 each Topical Daily  . enoxaparin (LOVENOX) injection  40 mg Subcutaneous Q24H  . gabapentin  100 mg Oral QHS  . mouth rinse  15 mL Mouth Rinse BID  . nicotine  21 mg Transdermal Daily   Continuous Infusions: . levofloxacin (LEVAQUIN) IV 750 mg (09/14/19 1102)    Principal Problem:   Empyema ([place]) Active Problems:   Acute hypoxemic respiratory failure ([place])   Chest tube in place   CAP (community acquired pneumonia)   Sepsis ([place])   LOS: 6 days   How to contact the [facility] Attending or Consulting provider [place] or covering provider during after hours [place], for this patient?  1. Check the care team in [facility] and look for a) attending/consulting TRH provider listed and b) the [facility] team listed 2. Log into [email or web address] and use [place]'s universal password to access. If you do not have the password, please contact the hospital operator. 3. Locate the [facility] provider you are looking for under [facility] and page to a number that you can be directly reached. 4. If you still have difficulty reaching the provider, please page the [facility] (Director on Call) for the Hospitalists listed on amion for assistance.

## 2019-09-15 LAB — BASIC METABOLIC PANEL
Anion gap: 12 (ref 5–15)
BUN: 11 mg/dL (ref 8–23)
CO2: 27 mmol/L (ref 22–32)
Calcium: 7.9 mg/dL — ABNORMAL LOW (ref 8.9–10.3)
Chloride: 95 mmol/L — ABNORMAL LOW (ref 98–111)
Creatinine, Ser: 0.46 mg/dL — ABNORMAL LOW (ref 0.61–1.24)
GFR calc Af Amer: >60 mL/min (ref >60)
GFR calc non Af Amer: >60 mL/min (ref >60)
Glucose, Bld: 100 mg/dL — ABNORMAL HIGH (ref 70–99)
Potassium: 3.3 mmol/L — ABNORMAL LOW (ref 3.5–5.1)
Sodium: 134 mmol/L — ABNORMAL LOW (ref 135–145)

## 2019-09-15 LAB — CBC
HCT: 27.2 % — ABNORMAL LOW (ref 39.0–52.0)
Hemoglobin: 8.9 g/dL — ABNORMAL LOW (ref 13.0–17.0)
MCH: 27.1 pg (ref 26.0–34.0)
MCHC: 32.7 g/dL (ref 30.0–36.0)
MCV: 82.7 fL (ref 80.0–100.0)
Platelets: 562 10*3/uL — ABNORMAL HIGH (ref 150–400)
RBC: 3.29 MIL/uL — ABNORMAL LOW (ref 4.22–5.81)
RDW: 15.9 % — ABNORMAL HIGH (ref 11.5–15.5)
WBC: 18 10*3/uL — ABNORMAL HIGH (ref 4.0–10.5)
nRBC: 0 % (ref 0.0–0.2)

## 2019-09-15 LAB — MAGNESIUM: Magnesium: 1.6 mg/dL — ABNORMAL LOW (ref 1.7–2.4)

## 2019-09-15 LAB — PHOSPHORUS: Phosphorus: 2.9 mg/dL (ref 2.5–4.6)

## 2019-09-15 MED ORDER — STERILE WATER FOR INJECTION IJ SOLN
5.0000 mg | Freq: Once | RESPIRATORY_TRACT | Status: AC
  Administered 2019-09-15: 5 mg via INTRAPLEURAL
  Filled 2019-09-15: qty 5

## 2019-09-15 MED ORDER — SODIUM CHLORIDE (PF) 0.9 % IJ SOLN
10.0000 mg | Freq: Once | INTRAMUSCULAR | Status: AC
  Administered 2019-09-15: 12:00:00 10 mg via INTRAPLEURAL
  Filled 2019-09-15: qty 10

## 2019-09-15 MED ORDER — POTASSIUM CHLORIDE CRYS ER 20 MEQ PO TBCR
40.0000 meq | EXTENDED_RELEASE_TABLET | ORAL | Status: AC
  Administered 2019-09-15 (×2): 40 meq via ORAL
  Filled 2019-09-15: qty 4
  Filled 2019-09-15: qty 2

## 2019-09-15 MED ORDER — MAGNESIUM SULFATE 2 GM/50ML IV SOLN
2.0000 g | Freq: Once | INTRAVENOUS | Status: AC
  Administered 2019-09-15: 2 g via INTRAVENOUS
  Filled 2019-09-15: qty 50

## 2019-09-15 MED ORDER — FUROSEMIDE 10 MG/ML IJ SOLN
20.0000 mg | Freq: Once | INTRAMUSCULAR | Status: AC
  Administered 2019-09-15: 20 mg via INTRAVENOUS
  Filled 2019-09-15: qty 2

## 2019-09-15 NOTE — Care Management Important Message (Signed)
Important Message  Patient Details IM Letter presented to the Patient Name: Edwin Torres MRN: [id number] Date of Birth: 1955-04-18   Medicare Important Message Given:  Yes     Kerin Salen 09/15/2019, 10:38 AM

## 2019-09-15 NOTE — Progress Notes (Signed)
PT Cancellation Note  Patient Details Name: Edwin Torres MRN: [id number] DOB: 29-Mar-1955   Cancelled Treatment:    Reason Eval/Treat Not Completed: Patient at procedure or test/unavailable Staff in room working with chest tube.  Pt will need at least 2 hours in bed thereafter, so PT to check back as schedule permits.   Corrine Tillis,KATHrine E 09/15/2019, 12:05 PM Arlyce Dice, DPT Acute Rehabilitation Services Pager: [phone] Office: [phone]

## 2019-09-15 NOTE — Progress Notes (Signed)
PROGRESS NOTE  KRISHIV SANDLER EQA:[id number] DOB: 10/25/1955 DOA: 09/08/2019 PCP: Inda Coke, PA  Brief History   64 year old man PMH including alcohol use, seizure disorder, 3rd nerve palsy with chronic right eye ptosis, poor dentition, presented with shortness of breath.  Admitted for acute hypoxic respiratory failure secondary to pneumonia, empyema.  Status post right chest tube.  Continue management per PCCM.  A & P  Sepsis secondary to Strep CAP w/ empyema present on admission with associated acute hypoxic respiratory failure . Sepsis symptomatology resolved  --Hemodynamics stable.  Oxygenation stable on high flow [place] --continue Levaquin --continue management per pulmonology.  Status post TPA 7/15, 7/16 and again 7/22 --Follow-up with dental medicine as an outpatient --Will need outpatient chest CT to follow-up resolution of pneumonia and can exclude underlying malignancy.  Normocytic anemia  --remains stable; secondary to chronic disease, f/u as an outpatient   Alcohol abuse --No evidence of withdrawal  Cigarette smoker --Cessation recommended  Chronic right eyelid ptosis --Follow-up as an outpatient  Hypovolemic hyponatremia  --nearly resolved  PMH seizure --Continue gabapentin  Disposition Plan:  Discussion: Oxygen requirement appears stable on 5 L HFNC.  Continue chest tube management per pulmonology.  Continue management per pulmonology.  No opportunity for discharge until chest tube removed.  Status is: Inpatient  Remains inpatient appropriate because:Inpatient level of care appropriate due to severity of illness   Dispo: The patient is from: Home              Anticipated d/c is to: Home              Anticipated d/c date is: 2 days              Patient currently is not medically stable to d/c.  DVT prophylaxis:  enoxaparin (LOVENOX) injection 40 mg Start: 09/08/19 1100 SCDs Start: 09/08/19 1056 Code Status: Full Family Communication: discussed w/ wife  by telephone 7/21 and 7/22  Murray Hodgkins, MD  [facility] Direct contact: see [email or web address] (further directions at bottom of note if needed) 7PM-7AM contact night coverage as at bottom of note 09/15/2019, 3:35 PM  LOS: 7 days   Significant Hospital Events   7/15 Admit, thoracentesis, chest tube placement with TPA 7/19 Pain at ct site / not able to wean off high flow 02 7/20 Chest Tube Placed to Water Seal    Consults:  . PCCM admitted 7/15, transferred care to [facility] 7/20    Procedures:  Thoracentesis 09/08/2019 significant for 250 cc of cloudy fluid R lytic rx  7/15 R Lytic rx 7/16   Significant Diagnostic Tests:  Pleural fluid from 7/15  Exudative with glucose <20   And wbc 97170 with 95% Polys  Micro Data:  COVID 19 PCR > Neg  MRSA PCR  7/15 > neg Pleural fluid culture 7/15 > strep intermedius > sensitive to levaquin  UC 7/15 > negative  BCx2  7/15 > negative  Antimicrobials:  Vancomycin 7/15 > 7/17 Levofloxacin  7/15 >>   Interval History/Subjective  No new issues today.  Objective   Vitals:  Vitals:   09/15/19 0601 09/15/19 1321  BP: 119/73 122/70  Pulse: 85 95  Resp: 16 16  Temp: 98.3 F (36.8 C) 98 F (36.7 C)  SpO2: 97% 93%    Exam:  Constitutional:   . Appears calm and comfortable Respiratory:  . CTA on the left, poor air movement on right. No w/r/r.  . Respiratory effort normal.  Cardiovascular:  . RRR, no m/r/g .  No LE extremity edema   Psychiatric:  . Mental status o Mood, affect appropriate  I have personally reviewed the following:   Today's Data  . K+ 3.3 . Mg 1.6 . Hgb stable at 8.9 . Slight increase in WBC to 18  Scheduled Meds: . acetaminophen  1,000 mg Oral TID  . Chlorhexidine Gluconate Cloth  6 each Topical Daily  . enoxaparin (LOVENOX) injection  40 mg Subcutaneous Q24H  . feeding supplement  1 Container Oral Q24H  . feeding supplement (ENSURE ENLIVE)  237 mL Oral BID BM  . gabapentin  100 mg Oral QHS  .  levofloxacin  750 mg Oral Daily  . mouth rinse  15 mL Mouth Rinse BID  . multivitamin with minerals  1 tablet Oral Daily  . nicotine  21 mg Transdermal Daily   Continuous Infusions:   Principal Problem:   Empyema (HCC) Active Problems:   Acute hypoxemic respiratory failure ([place])   Chest tube in place   CAP (community acquired pneumonia)   Sepsis ([place])   Malnutrition of moderate degree   LOS: 7 days   How to contact the [facility] Attending or Consulting provider [place] or covering provider during after hours [place], for this patient?  1. Check the care team in [facility] and look for a) attending/consulting TRH provider listed and b) the [facility] team listed 2. Log into [email or web address] and use [place]'s universal password to access. If you do not have the password, please contact the hospital operator. 3. Locate the [facility] provider you are looking for under [facility] and page to a number that you can be directly reached. 4. If you still have difficulty reaching the provider, please page the [facility] (Director on Call) for the Hospitalists listed on amion for assistance.

## 2019-09-15 NOTE — Progress Notes (Signed)
   NAME:  Edwin Torres, MRN:  [id number], DOB:  [birth year], LOS: 7 ADMISSION DATE:  09/08/2019, CONSULTATION DATE:  09/08/2019 REFERRING MD:  Dr Milton Ferguson, CHIEF COMPLAINT:  SOB   Brief History   64 year old with history of depression, alcohol use, seizures and poor dentition admitted with community-acquired pneumonia and empyema s/p chest tube placement and TPA.  History of 3rd nerve palsy dates back to 2018 as per notes from PCP office    Past Medical History  ETOH Abuse  Melanoma - on face, s/p removal  Depression  Genital Herpes  Seizures Right eye ptosis - 3rd nerve palsy, dates back to [place] [facility]   7/15 Admit, thoracentesis, chest tube placement with TPA 7/19 Pain at ct site / not able to wean off high flow 02 7/20 Chest Tube Placed to Water Seal   Consults:  PCCM  Procedures:  Thoracentesis 09/08/2019 significant for 250 cc of cloudy fluid R lytic rx  7/15 R Lytic rx 7/16   Significant Diagnostic Tests:  Pleural fluid from 7/15  Exudative with glucsoe <20   And wbc 97170 with 95% Polys  Micro Data:  COVID 19 PCR > Neg  MRSA PCR  7/15 > neg Pleural fluid culture 7/15 > strep intermedius > sensitive to levaquin  UC 7/15 > negative  BCx2  7/15 > negative  Antimicrobials:  Vancomycin 7/15 > 7/17 Levofloxacin  7/15 >>   Interim history/subjective:  Denies Pain/Nausea. Denies shortness of breath   Objective   Blood pressure 119/73, pulse 85, temperature 98.3 F (36.8 C), temperature source Oral, resp. rate 16, height 5\' 5"  (1.651 m), weight 44.4 kg, SpO2 97 %.        Intake/Output Summary (Last 24 hours) at 09/15/2019 1102 Last data filed at 09/15/2019 0626 Gross per 24 hour  Intake 1000 ml  Output 650 ml  Net 350 ml   Filed Weights   09/12/19 0500 09/13/19 0500 09/14/19 0500  Weight: 58.9 kg 43.4 kg 44.4 kg    Examination: General: adult male sitting in bed, no distress  HEENT: MM pink/moist, R eye ptosis  Neuro: Alert,  oriented, follows commands  CV: RRR, no MRG PULM: Coarse breath sounds, no accessory muscle use, chest tube in place to right lateral   GI: soft, non-tender, active bowel sounds  Extremities: -edema  Skin: warm, dry, intact   Resolved Hospital Problem list   Sepsis   Assessment & Plan:   Acute Hypoxemic Respiratory Failure in setting of Strep Intermedius Community-acquired Pneumonia with Empyema  and Acute Lung Injury from Sepsis  -S/p intrapleural tPA, DNase 7/15, 7/16.  No significant effusion on CXR 7/17 -residual hydropneumothorax on CXR 7/20 -Chest Tube 250 out over last 24 hours  Plan -Chest Tube Remains on Water Seal, Routine Dressing Changes   -CT Chest with significant improvement, however with residual loculated portion noted >> will plan for alteplase/pulmozyme today for one dose  -continuing Levaquin  -Currently on 5L [place] > Titrate for Saturation Goal 88-92% > Currently oxygen saturation 97%    Remaining Management Per Primary.   Best practice:  Diet: Regular DVT prophylaxis: Lovenox Mobility: Bedrest Code Status: Full code Family Communication: Updated patient on plan of care.     Hayden Pedro, AGACNP-BC [place] [facility]  Pgr: [phone]  PCCM Pgr: [phone]

## 2019-09-16 LAB — BASIC METABOLIC PANEL
Anion gap: 11 (ref 5–15)
BUN: 13 mg/dL (ref 8–23)
CO2: 27 mmol/L (ref 22–32)
Calcium: 8.2 mg/dL — ABNORMAL LOW (ref 8.9–10.3)
Chloride: 94 mmol/L — ABNORMAL LOW (ref 98–111)
Creatinine, Ser: 0.55 mg/dL — ABNORMAL LOW (ref 0.61–1.24)
GFR calc Af Amer: >60 mL/min (ref >60)
GFR calc non Af Amer: >60 mL/min (ref >60)
Glucose, Bld: 120 mg/dL — ABNORMAL HIGH (ref 70–99)
Potassium: 4.9 mmol/L (ref 3.5–5.1)
Sodium: 132 mmol/L — ABNORMAL LOW (ref 135–145)

## 2019-09-16 LAB — CBC
HCT: 31.4 % — ABNORMAL LOW (ref 39.0–52.0)
Hemoglobin: 10.1 g/dL — ABNORMAL LOW (ref 13.0–17.0)
MCH: 26.9 pg (ref 26.0–34.0)
MCHC: 32.2 g/dL (ref 30.0–36.0)
MCV: 83.5 fL (ref 80.0–100.0)
Platelets: 785 10*3/uL — ABNORMAL HIGH (ref 150–400)
RBC: 3.76 MIL/uL — ABNORMAL LOW (ref 4.22–5.81)
RDW: 16.2 % — ABNORMAL HIGH (ref 11.5–15.5)
WBC: 23.3 10*3/uL — ABNORMAL HIGH (ref 4.0–10.5)
nRBC: 0 % (ref 0.0–0.2)

## 2019-09-16 MED ORDER — TRAMADOL HCL 50 MG PO TABS
50.0000 mg | ORAL_TABLET | ORAL | Status: DC | PRN
  Administered 2019-09-16 – 2019-09-21 (×5): 50 mg via ORAL
  Filled 2019-09-16 (×4): qty 1

## 2019-09-16 NOTE — Progress Notes (Signed)
   NAME:  Edwin Torres, MRN:  [id number], DOB:  Oct 22, 1955, LOS: 8 ADMISSION DATE:  09/08/2019, CONSULTATION DATE:  09/08/2019 REFERRING MD:  Dr Milton Ferguson, CHIEF COMPLAINT:  SOB   Brief History   64 year old with history of depression, alcohol use, seizures and poor dentition admitted with community-acquired pneumonia and empyema s/p chest tube placement and TPA.  History of 3rd nerve palsy dates back to 2018 as per notes from PCP office    Past Medical History  ETOH Abuse  Melanoma - on face, s/p removal  Depression  Genital Herpes  Seizures Right eye ptosis - 3rd nerve palsy, dates back to [place] [facility]   7/15 Admit, thoracentesis, chest tube placement with TPA 7/19 Pain at ct site / not able to wean off high flow 02 7/20 Chest Tube Placed to [place]   Consults:  PCCM  Procedures:  Thoracentesis 09/08/2019 significant for 250 cc of cloudy fluid R lytic rx  7/15 R Lytic rx 7/16   Significant Diagnostic Tests:  Pleural fluid from 7/15  Exudative with glucsoe <20   And wbc 97170 with 95% Polys  Micro Data:  COVID 19 PCR > Neg  MRSA PCR  7/15 > neg Pleural fluid culture 7/15 > strep intermedius > sensitive to levaquin  UC 7/15 > negative  BCx2  7/15 > negative  Antimicrobials:  Vancomycin 7/15 > 7/17 Levofloxacin  7/15 >>   Interim history/subjective:  Feels good. Sitting in Chair. Reports not sleeping well overnight.   Objective   Blood pressure 115/71, pulse 97, temperature 98.4 F (36.9 C), temperature source Oral, resp. rate 18, height 5\' 5"  (1.651 m), weight 44.4 kg, SpO2 96 %.        Intake/Output Summary (Last 24 hours) at 09/16/2019 1135 Last data filed at 09/16/2019 1022 Gross per 24 hour  Intake 770 ml  Output 2080 ml  Net -1310 ml   Filed Weights   09/12/19 0500 09/13/19 0500 09/14/19 0500  Weight: 58.9 kg 43.4 kg 44.4 kg    Examination: General: adult male sitting in chair, no distress  HEENT: MM pink/moist, R eye  ptosis  Neuro: Alert, oriented, follows commands  CV: RRR, no MRG PULM: Coarse breath sounds, no accessory muscle use, chest tube in place to right lateral   GI: soft, non-tender, active bowel sounds  Extremities: -edema  Skin: warm, dry, intact   Resolved Hospital Problem list   Sepsis   Assessment & Plan:   Acute Hypoxemic Respiratory Failure in setting of Strep Intermedius Community-acquired Pneumonia with Empyema  and Acute Lung Injury from Sepsis  -S/p intrapleural tPA, DNase 7/15, 7/16.  No significant effusion on CXR 7/17 -S/P tPA, DNase 7/23  -residual hydropneumothorax on CXR 7/20 -Chest Tube 830 out over last 24 hours  Plan -Chest Tube Remains on [place], Routine Dressing Changes   -Will Continue Chest tube until decrease output is noted, patient with good response s/p tPA 7/23  -continuing Levaquin  -Currently on 4L [place] > Titrate for Saturation Goal 88-92% > Currently oxygen saturation 96%  -Encourage good pulmonary hygiene    Remaining Management Per Primary.   Best practice:  Diet: Regular DVT prophylaxis: Lovenox Mobility: Bedrest Code Status: Full code Family Communication: Updated patient on plan of care.     Hayden Pedro, AGACNP-BC [facility]  Pgr: [phone]  PCCM Pgr: [phone]

## 2019-09-16 NOTE — Progress Notes (Signed)
Pharmacy Antibiotic Note  Edwin Torres is a 64 y.o. male admitted on 09/08/2019 with pneumonia, pleural effusion s/p chest tube with TPA and dornase alpha instillation.  Pharmacy has been consulted for levaquin dosing; note history of anaphylaxis with PCN that required hospitalization.  09/16/19 1:35 PM - QTc monitored for levofloxacin dosing as elevated ona dmission. QTc 489 msec on EKG on 7/21 - WBC  trending up   - afebrile    Plan: Continue Levaquin 750mg  PO q24h Follow up renal function & cultures   Height: 5\' 5"  (165.1 cm) Weight: 44.4 kg (97 lb 14.2 oz) IBW/kg (Calculated) : 61.5  Temp (24hrs), Avg:98.4 F (36.9 C), Min:98.3 F (36.8 C), Max:98.4 F (36.9 C)  Recent Labs  Lab 09/12/19 0340 09/12/19 1040 09/13/19 0250 09/14/19 0240 09/15/19 0446 09/16/19 0503  WBC 17.8*  --  17.0* 16.0* 18.0* 23.3*  CREATININE 0.60* 0.56*  --  0.42* 0.46* 0.55*    Estimated Creatinine Clearance: 57.8 mL/min (A) (by C-G formula based on SCr of 0.55 mg/dL (L)).    Allergies  Allergen Reactions  . Penicillins Anaphylaxis    Has patient had a PCN reaction causing immediate rash, facial/tongue/throat swelling, SOB or lightheadedness with hypotension: Yes Has patient had a PCN reaction causing severe rash involving mucus membranes or skin necrosis: No Has patient had a PCN reaction that required hospitalization: Yes Has patient had a PCN reaction occurring within the last 10 years: No If all of the above answers are "NO", then may proceed with Cephalosporin use.   . Ativan [Lorazepam] Other (See Comments)    Made him crazy    Antimicrobials this admission:  7/15 Vancomycin  >> 7/16  7/15 Levaquin >>  Dose adjustments this admission:   Microbiology results:  7/15 MRSA PCR: neg 7/15 BCx: NGF 7/15 Pleural fluid: few Strep intermedius, LVQ sens 7/15 Pleural fungal: sent 7/15 UCx:ng-final  Thank you for allowing pharmacy to be a part of this patient's care.  Royetta Asal, PharmD, BCPS 09/16/2019 1:35 PM

## 2019-09-16 NOTE — Evaluation (Signed)
Physical Therapy Evaluation Patient Details Name: Edwin Torres MRN: [id number] DOB: [birth year] Today's Date: 09/16/2019   History of Present Illness  Patient is 64 year old man PMH including alcohol use, seizure disorder, 3rd nerve palsy with chronic right eye ptosis, poor dentition, presented with shortness of breath.  Admitted for acute hypoxic respiratory failure secondary to pneumonia, empyema.  Patient now s/p right chest tube on 09/15/19.    Clinical Impression  Edwin Torres is 64 y.o. male admitted with above HPI and diagnosis. Patient is currently limited by functional impairments below (see PT problem list). Patient lives with his wife and is independent with [facility] for mobility at baseline. Patient will benefit from continued skilled PT interventions to address impairments and progress independence with mobility. Anticipate pt will progress well in acute setting with no further PT needs at discharge. Acute PT will follow and progress as able.     Follow Up Recommendations No PT follow up    Equipment Recommendations  Rolling walker with 5" wheels    Recommendations for Other Services       Precautions / Restrictions Precautions Precautions: Fall Precaution Comments: pt reports his last fall was in December (states he was drinking back then and quit drinking alcohol since then) Restrictions Weight Bearing Restrictions: No      Mobility  Bed Mobility Overal bed mobility: Modified Independent             General bed mobility comments: HOB elevated, no assist required to raise trunk and bring LE's off EOB.   Transfers Overall transfer level: Needs assistance Equipment used: Rolling walker (2 wheeled) Transfers: Sit to/from Stand Sit to Stand: Supervision         General transfer comment: no assist required for power up or to rise. pt steady standing with RW.  Ambulation/Gait Ambulation/Gait assistance: Min guard Gait Distance (Feet): 80 Feet Assistive  device: Rolling walker (2 wheeled) Gait Pattern/deviations: Step-through pattern;Decreased step length - right;Decreased step length - left;Decreased stride length Gait velocity: decr   General Gait Details: min guard for safety, pt slightly unsteady with gait, no overt LOB noted. pt reliant on UE support to maintain balance. Pt on 6L/min with O2 and SpO2 remained at 94% or greater. HR elevated to 120's-130's.   Stairs            Wheelchair Mobility    Modified Rankin (Stroke Patients Only)       Balance Overall balance assessment: Needs assistance Sitting-balance support: Feet supported Sitting balance-Leahy Scale: Good     Standing balance support: During functional activity;Bilateral upper extremity supported Standing balance-Leahy Scale: Fair                  Pertinent Vitals/Pain Pain Assessment: Faces Faces Pain Scale: Hurts a little bit Pain Location: Rt chest tube site Pain Descriptors / Indicators: Discomfort Pain Intervention(s): Limited activity within patient's tolerance;Monitored during session    [place] expects to be discharged to:: Private residence Living Arrangements: Spouse/significant other Available Help at Discharge: Family Type of Home: House Home Access: Stairs to enter Entrance Stairs-Rails: Right Entrance Stairs-Number of Steps: 3-4 Home Layout: One level Home Equipment: Cane - single point      Prior Function Level of Independence: Independent with assistive device(s)         Comments: pt reports use of SPC occasionally and in home he admits to "furniture walking". pt is not able to help with much homemaking and his wife does the grocery shopping.  Hand Dominance   Dominant Hand: Right    Extremity/Trunk Assessment   Upper Extremity Assessment Upper Extremity Assessment: Overall WFL for tasks assessed    Lower Extremity Assessment Lower Extremity Assessment: Overall WFL for tasks assessed     Cervical / Trunk Assessment Cervical / Trunk Assessment: Normal  Communication   Communication: No difficulties  Cognition Arousal/Alertness: Awake/alert Behavior During Therapy: WFL for tasks assessed/performed Overall Cognitive Status: Within Functional Limits for tasks assessed               General Comments      Exercises     Assessment/Plan    PT Assessment Patient needs continued PT services  PT Problem List Decreased strength;Decreased activity tolerance;Decreased balance;Decreased mobility;Decreased safety awareness;Decreased knowledge of use of DME       PT Treatment Interventions DME instruction;Gait training;Stair training;Functional mobility training;Therapeutic activities;Therapeutic exercise;Balance training;Patient/family education    PT Goals (Current goals can be found in the Care Plan section)  Acute Rehab PT Goals Patient Stated Goal: get back home PT Goal Formulation: With patient Time For Goal Achievement: 09/30/19 Potential to Achieve Goals: Good    Frequency Min 3X/week    AM-PAC PT "6 Clicks" Mobility  Outcome Measure Help needed turning from your back to your side while in a flat bed without using bedrails?: None Help needed moving from lying on your back to sitting on the side of a flat bed without using bedrails?: None Help needed moving to and from a bed to a chair (including a wheelchair)?: A Little Help needed standing up from a chair using your arms (e.g., wheelchair or bedside chair)?: A Little Help needed to walk in hospital room?: A Little Help needed climbing 3-5 steps with a railing? : A Little 6 Click Score: 20    End of Session Equipment Utilized During Treatment: Oxygen Activity Tolerance: Patient tolerated treatment well Patient left: in chair;with call bell/phone within reach;with chair alarm set Nurse Communication: Mobility status PT Visit Diagnosis: Unsteadiness on feet (R26.81);Muscle weakness (generalized)  (M62.81);Difficulty in walking, not elsewhere classified (R26.2)    Time: 0935-1001 PT Time Calculation (min) (ACUTE ONLY): 26 min   Charges:   PT Evaluation $PT Eval Moderate Complexity: 1 Mod PT Treatments $Gait Training: 8-22 mins        Verner Mould, DPT Acute Rehabilitation Services  Office [phone] Pager [phone]  09/16/2019 11:33 AM

## 2019-09-16 NOTE — Progress Notes (Addendum)
PROGRESS NOTE  Edwin Torres KVQ:[id number] DOB: January 29, 1956 DOA: 09/08/2019 PCP: Inda Coke, PA  Brief History   64 year old man PMH including alcohol use, seizure disorder, 3rd nerve palsy with chronic right eye ptosis, poor dentition, presented with shortness of breath.  Admitted for acute hypoxic respiratory failure secondary to pneumonia, empyema.  Status post right chest tube.  Continue management per PCCM.  A & P  Sepsis secondary to Strep CAP w/ empyema present on admission with associated acute hypoxic respiratory failure . Sepsis symptomatology resolved  --Hemodynamics remain stable.  Oxygenation stable on [place]. --continue Levaquin --continue management per pulmonology.  Status post TPA 7/15, 7/16 and again 7/22. Plan to remove CT when outpt <159mL in 24 hours. --Follow-up with dental medicine as an outpatient --Will need outpatient chest CT to follow-up resolution of pneumonia and can exclude underlying malignancy.  Normocytic anemia  --remains stable; secondary to chronic disease, f/u as an outpatient   Alcohol abuse --No evidence of withdrawal  Cigarette smoker --Cessation recommended  Chronic right eyelid ptosis --Follow-up as an outpatient  Hyponatremia  --stable. Probably secondary to acute lung process.  PMH seizure --Continue gabapentin  Disposition Plan:  Discussion: Oxygen requirement stable. continuechest tube management per pulmonology.   No opportunity for discharge until chest tube removed.  Status is: Inpatient  Remains inpatient appropriate because:Inpatient level of care appropriate due to severity of illness  Dispo: The patient is from: Home              Anticipated d/c is to: Home no PT needed              Anticipated d/c date is: 2 days              Patient currently is not medically stable to d/c.  DVT prophylaxis:  enoxaparin (LOVENOX) injection 40 mg Start: 09/08/19 1100 SCDs Start: 09/08/19 1056 Code Status: Full Family  Communication: discussed w/ wife by telephone 7/23  Murray Hodgkins, MD  [facility] Direct contact: see [email or web address] (further directions at bottom of note if needed) 7PM-7AM contact night coverage as at bottom of note 09/16/2019, 5:03 PM  LOS: 8 days   Significant Hospital Events   7/15 Admit, thoracentesis, chest tube placement with TPA 7/19 Pain at ct site / not able to wean off high flow 02 7/20 Chest Tube Placed to Water Seal    Consults:  . PCCM admitted 7/15, transferred care to [facility] 7/20    Procedures:  Thoracentesis 09/08/2019 significant for 250 cc of cloudy fluid R lytic rx  7/15 R Lytic rx 7/16   Significant Diagnostic Tests:  Pleural fluid from 7/15  Exudative with glucose <20   And wbc 97170 with 95% Polys  Micro Data:  COVID 19 PCR > Neg  MRSA PCR  7/15 > neg Pleural fluid culture 7/15 > strep intermedius > sensitive to levaquin  UC 7/15 > negative  BCx2  7/15 > negative  Antimicrobials:  Vancomycin 7/15 > 7/17 Levofloxacin  7/15 >>   Interval History/Subjective  Feels ok. Breathing ok. Pain w/ chest tube.  Objective   Vitals:  Vitals:   09/16/19 0613 09/16/19 1406  BP: 115/71 103/71  Pulse: 97 96  Resp: 18 17  Temp: 98.4 F (36.9 C) 98.2 F (36.8 C)  SpO2: 96% 98%    Exam:  Constitutional:   . Appears calm and comfortable, sitting up in chair. Respiratory:  . CTA on left; improved air movement on the right, no w/r/r.  . Respiratory  effort normal.  Cardiovascular:  . RRR, no m/r/g . No LE extremity edema   Psychiatric:  . Mental status o Mood, affect appropriate  I have personally reviewed the following:   Today's Data  . Na+ 132 . WBC up a bit to 23.3 . Hgb up to 10.1  Scheduled Meds: . acetaminophen  1,000 mg Oral TID  . Chlorhexidine Gluconate Cloth  6 each Topical Daily  . enoxaparin (LOVENOX) injection  40 mg Subcutaneous Q24H  . feeding supplement  1 Container Oral Q24H  . feeding supplement (ENSURE ENLIVE)  237  mL Oral BID BM  . gabapentin  100 mg Oral QHS  . levofloxacin  750 mg Oral Daily  . mouth rinse  15 mL Mouth Rinse BID  . multivitamin with minerals  1 tablet Oral Daily  . nicotine  21 mg Transdermal Daily   Continuous Infusions:   Principal Problem:   Empyema (HCC) Active Problems:   Acute hypoxemic respiratory failure ([place])   Chest tube in place   CAP (community acquired pneumonia)   Sepsis ([place])   Malnutrition of moderate degree   LOS: 8 days   How to contact the [facility] Attending or Consulting provider [place] or covering provider during after hours [place], for this patient?  1. Check the care team in [facility] and look for a) attending/consulting TRH provider listed and b) the [facility] team listed 2. Log into [email or web address] and use [place]'s universal password to access. If you do not have the password, please contact the hospital operator. 3. Locate the [facility] provider you are looking for under [facility] and page to a number that you can be directly reached. 4. If you still have difficulty reaching the provider, please page the [facility] (Director on Call) for the Hospitalists listed on amion for assistance.

## 2019-09-17 LAB — BASIC METABOLIC PANEL
Anion gap: 9 (ref 5–15)
BUN: 12 mg/dL (ref 8–23)
CO2: 29 mmol/L (ref 22–32)
Calcium: 8.1 mg/dL — ABNORMAL LOW (ref 8.9–10.3)
Chloride: 92 mmol/L — ABNORMAL LOW (ref 98–111)
Creatinine, Ser: 0.59 mg/dL — ABNORMAL LOW (ref 0.61–1.24)
GFR calc Af Amer: >60 mL/min (ref >60)
GFR calc non Af Amer: >60 mL/min (ref >60)
Glucose, Bld: 94 mg/dL (ref 70–99)
Potassium: 4.6 mmol/L (ref 3.5–5.1)
Sodium: 130 mmol/L — ABNORMAL LOW (ref 135–145)

## 2019-09-17 LAB — CBC
HCT: 25.4 % — ABNORMAL LOW (ref 39.0–52.0)
Hemoglobin: 8.3 g/dL — ABNORMAL LOW (ref 13.0–17.0)
MCH: 27.2 pg (ref 26.0–34.0)
MCHC: 32.7 g/dL (ref 30.0–36.0)
MCV: 83.3 fL (ref 80.0–100.0)
Platelets: 683 10*3/uL — ABNORMAL HIGH (ref 150–400)
RBC: 3.05 MIL/uL — ABNORMAL LOW (ref 4.22–5.81)
RDW: 16.7 % — ABNORMAL HIGH (ref 11.5–15.5)
WBC: 15.4 10*3/uL — ABNORMAL HIGH (ref 4.0–10.5)
nRBC: 0 % (ref 0.0–0.2)

## 2019-09-17 NOTE — Progress Notes (Signed)
PROGRESS NOTE  AVYAAN SUMMER WJX:[id number] DOB: 07/07/1955 DOA: 09/08/2019 PCP: Inda Coke, PA  Brief History   64 year old man PMH including alcohol use, seizure disorder, 3rd nerve palsy with chronic right eye ptosis, poor dentition, presented with shortness of breath.  Admitted for acute hypoxic respiratory failure secondary to pneumonia, empyema.  Status post right chest tube.  Continue management per PCCM.  A & P  Sepsis secondary to Strep CAP w/ empyema present on admission with associated acute hypoxic respiratory failure . Sepsis symptomatology resolved  --remains stable on 5L HFNC --continue abx --continue management per pulmonology.  Status post TPA 7/15, 7/16 and again 7/22. CT output 500 mL last 24 hours. Plan to remove CT when outpt <161mL in 24 hours. --Follow-up with dental medicine as an outpatient --Will need outpatient chest CT to follow-up resolution of pneumonia and can exclude underlying malignancy.  Normocytic anemia  --remains stable; secondary to chronic disease, f/u as an outpatient   Alcohol abuse --No evidence of withdrawal  Cigarette smoker --Cessation recommended  Chronic right eyelid ptosis --Follow-up as an outpatient  Hyponatremia  --stable. Probably secondary to acute lung process.  PMH seizure --Continue gabapentin  Disposition Plan:  Discussion: Oxygen requirement stable. Continue chest tube management per pulmonology.   No opportunity for discharge until chest tube removed.  Status is: Inpatient  Remains inpatient appropriate because:Inpatient level of care appropriate due to severity of illness  Dispo: The patient is from: Home              Anticipated d/c is to: Home no PT needed              Anticipated d/c date is: 2 days              Patient currently is not medically stable to d/c.  DVT prophylaxis:  enoxaparin (LOVENOX) injection 40 mg Start: 09/08/19 1100 SCDs Start: 09/08/19 1056 Code Status: Full Family  Communication: will call wife by telephone later today  Murray Hodgkins, MD  [facility] Direct contact: see [email or web address] (further directions at bottom of note if needed) 7PM-7AM contact night coverage as at bottom of note 09/17/2019, 3:47 PM  LOS: 9 days   Significant Hospital Events   7/15 Admit, thoracentesis, chest tube placement with TPA 7/19 Pain at ct site / not able to wean off high flow 02 7/20 Chest Tube Placed to Water Seal    Consults:  . PCCM admitted 7/15, transferred care to [facility] 7/20    Procedures:  Thoracentesis 09/08/2019 significant for 250 cc of cloudy fluid R lytic rx  7/15 R Lytic rx 7/16   Significant Diagnostic Tests:  Pleural fluid from 7/15  Exudative with glucose <20   And wbc 97170 with 95% Polys  Micro Data:  COVID 19 PCR > Neg  MRSA PCR  7/15 > neg Pleural fluid culture 7/15 > strep intermedius > sensitive to levaquin  UC 7/15 > negative  BCx2  7/15 > negative  Antimicrobials:  Vancomycin 7/15 > 7/17 Levofloxacin  7/15 >>   Interval History/Subjective  Feels better, breathing fine. Eating ok.  Objective   Vitals:  Vitals:   09/17/19 0605 09/17/19 1312  BP: 119/77 111/71  Pulse: 79 80  Resp: 17 16  Temp: 98 F (36.7 C) 97.8 F (36.6 C)  SpO2: 99% 98%    Exam:  Constitutional:   . Appears calm and comfortable Respiratory:  . CTA bilaterally anteriorly, improved air movement on right, no w/r/r.  . Respiratory effort  normal.  Cardiovascular:  . RRR, no m/r/g . No LE extremity edema   Psychiatric:  . Mental status o Mood, affect appropriate  I have personally reviewed the following:   Today's Data  . Na+ stable at 130 . WBC down to 15.4 . Hgb stable at 8.3  Scheduled Meds: . acetaminophen  1,000 mg Oral TID  . Chlorhexidine Gluconate Cloth  6 each Topical Daily  . enoxaparin (LOVENOX) injection  40 mg Subcutaneous Q24H  . feeding supplement  1 Container Oral Q24H  . feeding supplement (ENSURE ENLIVE)  237 mL  Oral BID BM  . gabapentin  100 mg Oral QHS  . levofloxacin  750 mg Oral Daily  . mouth rinse  15 mL Mouth Rinse BID  . multivitamin with minerals  1 tablet Oral Daily  . nicotine  21 mg Transdermal Daily   Continuous Infusions:   Principal Problem:   Empyema (HCC) Active Problems:   Acute hypoxemic respiratory failure ([place])   Chest tube in place   CAP (community acquired pneumonia)   Sepsis ([place])   Malnutrition of moderate degree   LOS: 9 days   How to contact the [facility] Attending or Consulting provider [place] or covering provider during after hours [place], for this patient?  1. Check the care team in [facility] and look for a) attending/consulting TRH provider listed and b) the [facility] team listed 2. Log into [email or web address] and use [place]'s universal password to access. If you do not have the password, please contact the hospital operator. 3. Locate the [facility] provider you are looking for under [facility] and page to a number that you can be directly reached. 4. If you still have difficulty reaching the provider, please page the [place] (Director on Call) for the Hospitalists listed on amion for assistance.

## 2019-09-18 LAB — BASIC METABOLIC PANEL
Anion gap: 9 (ref 5–15)
BUN: 12 mg/dL (ref 8–23)
CO2: 27 mmol/L (ref 22–32)
Calcium: 8.1 mg/dL — ABNORMAL LOW (ref 8.9–10.3)
Chloride: 96 mmol/L — ABNORMAL LOW (ref 98–111)
Creatinine, Ser: 0.55 mg/dL — ABNORMAL LOW (ref 0.61–1.24)
GFR calc Af Amer: >60 mL/min (ref >60)
GFR calc non Af Amer: >60 mL/min (ref >60)
Glucose, Bld: 98 mg/dL (ref 70–99)
Potassium: 3.7 mmol/L (ref 3.5–5.1)
Sodium: 132 mmol/L — ABNORMAL LOW (ref 135–145)

## 2019-09-18 LAB — CBC
HCT: 26.1 % — ABNORMAL LOW (ref 39.0–52.0)
Hemoglobin: 8.1 g/dL — ABNORMAL LOW (ref 13.0–17.0)
MCH: 26.6 pg (ref 26.0–34.0)
MCHC: 31 g/dL (ref 30.0–36.0)
MCV: 85.6 fL (ref 80.0–100.0)
Platelets: 758 10*3/uL — ABNORMAL HIGH (ref 150–400)
RBC: 3.05 MIL/uL — ABNORMAL LOW (ref 4.22–5.81)
RDW: 16.9 % — ABNORMAL HIGH (ref 11.5–15.5)
WBC: 14.4 10*3/uL — ABNORMAL HIGH (ref 4.0–10.5)
nRBC: 0 % (ref 0.0–0.2)

## 2019-09-18 NOTE — Progress Notes (Signed)
PROGRESS NOTE  Edwin Torres VCB:[id number] DOB: 04/05/1955 DOA: 09/08/2019 PCP: Inda Coke, PA  Brief History   64 year old man PMH including alcohol use, seizure disorder, 3rd nerve palsy with chronic right eye ptosis, poor dentition, presented with shortness of breath.  Admitted for acute hypoxic respiratory failure secondary to pneumonia, empyema.  Status post right chest tube.  Continue management per PCCM.  A & P  Sepsis secondary to Strep CAP w/ empyema present on admission with associated acute hypoxic respiratory failure . Sepsis symptomatology resolved  --remains stable on 5L HFNC --continue abx --continue management per pulmonology.  Status post TPA 7/15, 7/16 and again 7/22. CT output 450 mL last 24 hours. Plan to remove CT when outpt <121mL in 24 hours. --Follow-up with dental medicine as an outpatient --Will need outpatient chest CT to follow-up resolution of pneumonia and can exclude underlying malignancy.  Normocytic anemia  --remains stable; secondary to chronic disease, f/u as an outpatient   Alcohol abuse --No evidence of withdrawal  Cigarette smoker --Cessation recommended  Chronic right eyelid ptosis --Follow-up as an outpatient  Hyponatremia  --stable. Probably secondary to acute lung process.  PMH seizure --Continue gabapentin  Disposition Plan:  Discussion: Oxygen requirement remains stable. Continue chest tube management per pulmonology.   No opportunity for discharge until chest tube removed.  Status is: Inpatient  Remains inpatient appropriate because:Inpatient level of care appropriate due to severity of illness  Dispo: The patient is from: Home              Anticipated d/c is to: Home no PT needed              Anticipated d/c date is: 2 days              Patient currently is not medically stable to d/c.  DVT prophylaxis:  enoxaparin (LOVENOX) injection 40 mg Start: 09/08/19 1100 SCDs Start: 09/08/19 1056 Code Status: Full Family  Communication: I updated wife by phone 7/24 and 7/25  Murray Hodgkins, MD  [facility] Direct contact: see [email or web address] (further directions at bottom of note if needed) 7PM-7AM contact night coverage as at bottom of note 09/18/2019, 4:29 PM  LOS: 10 days   Significant Hospital Events   7/15 Admit, thoracentesis, chest tube placement with TPA 7/19 Pain at ct site / not able to wean off high flow 02 7/20 Chest Tube Placed to Water Seal    Consults:  . PCCM admitted 7/15, transferred care to [facility] 7/20    Procedures:  Thoracentesis 09/08/2019 significant for 250 cc of cloudy fluid R lytic rx  7/15 R Lytic rx 7/16, 7/22  Significant Diagnostic Tests:  Pleural fluid from 7/15  Exudative with glucose <20   And wbc 97170 with 95% Polys  Micro Data:  COVID 19 PCR > Neg  MRSA PCR  7/15 > neg Pleural fluid culture 7/15 > strep intermedius > sensitive to levaquin  UC 7/15 > negative  BCx2  7/15 > negative  Antimicrobials:  Vancomycin 7/15 > 7/17 Levofloxacin  7/15 >>   Interval History/Subjective  Feels fine, no complaints.   Objective   Vitals:  Vitals:   09/18/19 0540 09/18/19 1323  BP: 116/66 115/66  Pulse: 91 82  Resp: 19   Temp: 98.2 F (36.8 C) 98.6 F (37 C)  SpO2: 99% 97%    Exam: Constitutional:   . Appears calm and comfortable Respiratory:  . CTA bilaterally, improved air movement on the right, no w/r/r.  . Respiratory effort  normal.  Cardiovascular:  . RRR, no m/r/g . No LE extremity edema   Psychiatric:  . Mental status o Mood, affect appropriate  I have personally reviewed the following:   Today's Data  . UOP 1900 . Na 32 . WBC 14.4, trending down . Hgb stable at 8.1  Scheduled Meds: . acetaminophen  1,000 mg Oral TID  . Chlorhexidine Gluconate Cloth  6 each Topical Daily  . enoxaparin (LOVENOX) injection  40 mg Subcutaneous Q24H  . feeding supplement  1 Container Oral Q24H  . feeding supplement (ENSURE ENLIVE)  237 mL Oral BID BM    . gabapentin  100 mg Oral QHS  . levofloxacin  750 mg Oral Daily  . mouth rinse  15 mL Mouth Rinse BID  . multivitamin with minerals  1 tablet Oral Daily  . nicotine  21 mg Transdermal Daily   Continuous Infusions:   Principal Problem:   Empyema (HCC) Active Problems:   Acute hypoxemic respiratory failure ([place])   Chest tube in place   CAP (community acquired pneumonia)   Sepsis ([place])   Malnutrition of moderate degree   LOS: 10 days   How to contact the [facility] Attending or Consulting provider [place] or covering provider during after hours [place], for this patient?  1. Check the care team in [facility] and look for a) attending/consulting TRH provider listed and b) the [facility] team listed 2. Log into [email or web address] and use [place]'s universal password to access. If you do not have the password, please contact the hospital operator. 3. Locate the [facility] provider you are looking for under [facility] and page to a number that you can be directly reached. 4. If you still have difficulty reaching the provider, please page the [facility] (Director on Call) for the Hospitalists listed on amion for assistance.

## 2019-09-19 NOTE — Progress Notes (Signed)
Pharmacy Antibiotic Note  Edwin Torres is a 64 y.o. male admitted on 09/08/2019 with pneumonia, pleural effusion s/p chest tube with TPA and dornase alpha instillation.  Pharmacy has been consulted for levaquin dosing; note history of anaphylaxis with PCN that required hospitalization.  Day #12 abx - QTc monitored for levofloxacin dosing as elevated on admission. QTc 489 msec on tele on 7/21 - WBC trending down - Afebrile - SCr stable  Plan: Continue Levaquin 750mg  PO q24h Follow up renal function & cultures   Height: 5\' 5"  (165.1 cm) Weight: 54.8 kg (120 lb 13 oz) IBW/kg (Calculated) : 61.5  Temp (24hrs), Avg:98.3 F (36.8 C), Min:98 F (36.7 C), Max:98.5 F (36.9 C)  Recent Labs  Lab 09/14/19 0240 09/15/19 0446 09/16/19 0503 09/17/19 0701 09/18/19 0553  WBC 16.0* 18.0* 23.3* 15.4* 14.4*  CREATININE 0.42* 0.46* 0.55* 0.59* 0.55*    Estimated Creatinine Clearance: 71.4 mL/min (A) (by C-G formula based on SCr of 0.55 mg/dL (L)).    Allergies  Allergen Reactions  . Penicillins Anaphylaxis    Has patient had a PCN reaction causing immediate rash, facial/tongue/throat swelling, SOB or lightheadedness with hypotension: Yes Has patient had a PCN reaction causing severe rash involving mucus membranes or skin necrosis: No Has patient had a PCN reaction that required hospitalization: Yes Has patient had a PCN reaction occurring within the last 10 years: No If all of the above answers are "NO", then may proceed with Cephalosporin use.   . Ativan [Lorazepam] Other (See Comments)    Made him crazy    Antimicrobials this admission:  7/15 Vancomycin  >> 7/16  7/15 Levaquin >>  Dose adjustments this admission:   Microbiology results:  7/15 MRSA PCR: neg 7/15 BCx: NGF 7/15 Pleural fluid: few Strep intermedius, LVQ sens 7/15 Pleural fungal: no fungus observed 7/15 UCx:ng-final  Thank you for allowing pharmacy to be a part of this patient's care.  Peggyann Juba,  PharmD, BCPS Pharmacy: [phone] 09/19/2019 1:38 PM

## 2019-09-19 NOTE — Progress Notes (Signed)
   NAME:  Edwin Torres, MRN:  [id number], DOB:  08/25/1955, LOS: 10 ADMISSION DATE:  09/08/2019, CONSULTATION DATE:  09/08/2019 REFERRING MD:  Dr Milton Ferguson, CHIEF COMPLAINT:  SOB   Brief History   64 year old with history of depression, alcohol use, seizures and poor dentition admitted with community-acquired pneumonia and empyema s/p chest tube placement and TPA.  History of 3rd nerve palsy dates back to 2018 as per notes from PCP office    Past Medical History  ETOH Abuse  Melanoma - on face, s/p removal  Depression  Genital Herpes  Seizures Right eye ptosis - 3rd nerve palsy, dates back to [place] [facility]   7/15 Admit, thoracentesis, chest tube placement with TPA 7/19 Pain at ct site / not able to wean off high flow 02 7/20 Chest Tube Placed to Water Seal   Consults:  PCCM  Procedures:  Thoracentesis 09/08/2019 significant for 250 cc of cloudy fluid R lytic rx  7/15 R Lytic rx 7/16   Significant Diagnostic Tests:  Pleural fluid from 7/15  Exudative with glucsoe <20   And wbc 97170 with 95% Polys  Micro Data:  COVID 19 PCR > Neg  MRSA PCR  7/15 > neg Pleural fluid culture 7/15 > strep intermedius > sensitive to levaquin  UC 7/15 > negative  BCx2  7/15 > negative  Antimicrobials:  Vancomycin 7/15 > 7/17 Levofloxacin  7/15 >>   Interim history/subjective:  No c/o.  Denies SOB.  Mild cough.   Objective   Blood pressure 115/69, pulse 73, temperature 98 F (36.7 C), temperature source Oral, resp. rate 18, height 5\' 5"  (1.651 m), weight 54.8 kg, SpO2 96 %.        Intake/Output Summary (Last 24 hours) at 09/19/2019 1116 Last data filed at 09/19/2019 1038 Gross per 24 hour  Intake 2040 ml  Output 2325 ml  Net -285 ml   Filed Weights   09/13/19 0500 09/14/19 0500 09/17/19 0605  Weight: 43.4 kg 44.4 kg 54.8 kg    Examination: General: adult male sitting in chair, no distress  HEENT: MM pink/moist, R eye ptosis  Neuro: Alert, oriented,  follows commands  CV: RRR, no MRG PULM: resps even non labored on [place], clear, chest tube in place to right lateral with serous drainage GI: soft, non-tender, active bowel sounds  Extremities: -edema  Skin: warm, dry, intact   Resolved Hospital Problem list   Sepsis   Assessment & Plan:   Acute Hypoxemic Respiratory Failure in setting of Strep Intermedius Community-acquired Pneumonia with Empyema  and Acute Lung Injury from Sepsis  -S/p intrapleural tPA, DNase 7/15, 7/16.  No significant effusion on CXR 7/17 -S/P tPA, DNase 7/23  -residual hydropneumothorax on CXR 7/20 -Chest Tube ~200-250 out over last 24 hours  Plan -Chest Tube Remains on Water Seal, Routine Dressing Changes   -Will Continue Chest tube until decrease output is noted - goal <179ml/24hrs.  Drainage is slowing  -continuing Levaquin  -Titrate for Saturation Goal 88-92%  -Encourage good pulmonary hygiene    Remaining Management Per Primary.   Best practice:  Diet: Regular DVT prophylaxis: Lovenox Mobility: Bedrest Code Status: Full code Family Communication: Updated patient on plan of care 7/26.     Nickolas Madrid, NP Pulmonary/Critical Care Medicine  09/19/2019  11:16 AM

## 2019-09-19 NOTE — Progress Notes (Signed)
PROGRESS NOTE  Edwin Torres:[id number] DOB: [birth year] DOA: 09/08/2019 PCP: Inda Coke, PA  Brief History   64 year old man PMH including alcohol use, seizure disorder, 3rd nerve palsy with chronic right eye ptosis, poor dentition, presented with shortness of breath.  Admitted for acute hypoxic respiratory failure secondary to pneumonia, empyema.  Status post right chest tube.  Continue management per PCCM.  A & P  Sepsis secondary to Strep CAP w/ empyema present on admission with associated acute hypoxic respiratory failure . Sepsis symptomatology resolved  --now down to 2L HFNC --continue abx --continue management per pulmonology.  Status post TPA 7/15, 7/16 and again 7/22. CT output 200 mL last 24 hours. Plan to remove CT when outpt <155mL in 24 hours. --Follow-up with dental medicine as an outpatient --Will need outpatient chest CT to follow-up resolution of pneumonia and can exclude underlying malignancy.  Normocytic anemia  --stable; secondary to chronic disease, f/u as an outpatient   Alcohol abuse --No evidence of withdrawal  Cigarette smoker --Cessation recommended  Chronic right eyelid ptosis --Follow-up as an outpatient  Hyponatremia  --stable. Probably secondary to acute lung process.  PMH seizure --Continue gabapentin  Disposition Plan:  Discussion: Oxygen requirement decreased. Ctoninue chest tube management per pulmonology.   No opportunity for discharge until chest tube removed.  Status is: Inpatient  Remains inpatient appropriate because:Inpatient level of care appropriate due to severity of illness  Dispo: The patient is from: Home              Anticipated d/c is to: Home no PT needed              Anticipated d/c date is: 2 days              Patient currently is not medically stable to d/c.  DVT prophylaxis:  enoxaparin (LOVENOX) injection 40 mg Start: 09/08/19 1100 SCDs Start: 09/08/19 1056 Code Status: Full Family Communication: I  updated wife by phone 7/26  Murray Hodgkins, MD  [facility] Direct contact: see [email or web address] (further directions at bottom of note if needed) 7PM-7AM contact night coverage as at bottom of note 09/19/2019, 4:00 PM  LOS: 11 days   Significant Hospital Events   7/15 Admit, thoracentesis, chest tube placement with TPA 7/19 Pain at ct site / not able to wean off high flow 02 7/20 Chest Tube Placed to Water Seal    Consults:  . PCCM admitted 7/15, transferred care to [facility] 7/20    Procedures:  Thoracentesis 09/08/2019 significant for 250 cc of cloudy fluid R lytic rx  7/15 R Lytic rx 7/16, 7/22  Significant Diagnostic Tests:  Pleural fluid from 7/15  Exudative with glucose <20   And wbc 97170 with 95% Polys  Micro Data:  COVID 19 PCR > Neg  MRSA PCR  7/15 > neg Pleural fluid culture 7/15 > strep intermedius > sensitive to levaquin  UC 7/15 > negative  BCx2  7/15 > negative  Antimicrobials:  Vancomycin 7/15 > 7/17 Levofloxacin  7/15 >>   Interval History/Subjective  Feels ok, breathing fine.   Objective   Vitals:  Vitals:   09/19/19 0618 09/19/19 1351  BP: 115/69 106/66  Pulse: 73 84  Resp: 18 16  Temp: 98 F (36.7 C) 98.2 F (36.8 C)  SpO2: 96% 93%    Exam: Constitutional:   . Appears calm and comfortable Respiratory:  . CTA bilaterally, no w/r/r.  . Respiratory effort normal.  Cardiovascular:  . RRR, no m/r/g Psychiatric:  .  Mental status o Mood, affect appropriate  I have personally reviewed the following:   Today's Data  . UOP 2425  Scheduled Meds: . acetaminophen  1,000 mg Oral TID  . Chlorhexidine Gluconate Cloth  6 each Topical Daily  . enoxaparin (LOVENOX) injection  40 mg Subcutaneous Q24H  . feeding supplement  1 Container Oral Q24H  . feeding supplement (ENSURE ENLIVE)  237 mL Oral BID BM  . gabapentin  100 mg Oral QHS  . levofloxacin  750 mg Oral Daily  . mouth rinse  15 mL Mouth Rinse BID  . multivitamin with minerals  1 tablet  Oral Daily  . nicotine  21 mg Transdermal Daily   Continuous Infusions:   Principal Problem:   Empyema (HCC) Active Problems:   Acute hypoxemic respiratory failure ([place])   Chest tube in place   CAP (community acquired pneumonia)   Sepsis ([place])   Malnutrition of moderate degree   LOS: 11 days   How to contact the [facility] Attending or Consulting provider [place] or covering provider during after hours [place], for this patient?  1. Check the care team in [facility] and look for a) attending/consulting TRH provider listed and b) the [facility] team listed 2. Log into [email or web address] and use [place]'s universal password to access. If you do not have the password, please contact the hospital operator. 3. Locate the [facility] provider you are looking for under [facility] and page to a number that you can be directly reached. 4. If you still have difficulty reaching the provider, please page the [facility] (Director on Call) for the Hospitalists listed on amion for assistance.

## 2019-09-20 DIAGNOSIS — J869 Pyothorax without fistula: Secondary | ICD-10-CM | POA: Diagnosis not present

## 2019-09-20 NOTE — Progress Notes (Signed)
° °  NAME:  RONDALE NIES, MRN:  [id number], DOB:  08-02-1955, LOS: 12 ADMISSION DATE:  09/08/2019, CONSULTATION DATE:  09/08/2019 REFERRING MD:  Dr Milton Ferguson, CHIEF COMPLAINT:  SOB   Brief History   64 year old with history of depression, alcohol use, seizures and poor dentition admitted with community-acquired pneumonia and empyema s/p chest tube placement and TPA.  History of 3rd nerve palsy dates back to 2018 as per notes from PCP office    Past Medical History  ETOH Abuse  Melanoma - on face, s/p removal  Depression  Genital Herpes  Seizures Right eye ptosis - 3rd nerve palsy, dates back to [place] [facility]   7/15 Admit, thoracentesis, chest tube placement with TPA 7/19 Pain at ct site / not able to wean off high flow 02 7/20 Chest Tube Placed to Water Seal   Consults:  PCCM  Procedures:  Thoracentesis 09/08/2019 significant for 250 cc of cloudy fluid R lytic rx  7/15 R Lytic rx 7/16   Significant Diagnostic Tests:  Pleural fluid from 7/15  Exudative with glucsoe <20   And wbc 97170 with 95% Polys  Micro Data:  COVID 19 PCR > Neg  MRSA PCR  7/15 > neg Pleural fluid culture 7/15 > strep intermedius > sensitive to levaquin  UC 7/15 > negative  BCx2  7/15 > negative  Antimicrobials:  Vancomycin 7/15 > 7/17 Levofloxacin  7/15 >>   Interim history/subjective:  No c/o.   ~297ml out of chest tube over last 24hrs  Objective   Blood pressure 123/72, pulse 84, temperature 98.8 F (37.1 C), temperature source Oral, resp. rate 16, height 5\' 5"  (1.651 m), weight 54.8 kg, SpO2 97 %.        Intake/Output Summary (Last 24 hours) at 09/20/2019 1156 Last data filed at 09/20/2019 1000 Gross per 24 hour  Intake 1560 ml  Output 2600 ml  Net -1040 ml   Filed Weights   09/13/19 0500 09/14/19 0500 09/17/19 0605  Weight: 43.4 kg 44.4 kg 54.8 kg    Examination: General: adult male, NAD, OOB in chair working with PT  HEENT: MM pink/moist, R eye ptosis    Neuro: Alert, oriented, follows commands  CV: RRR, no MRG PULM: resps even non labored on [place], clear, chest tube in place to right lateral with serous drainage GI: soft, non-tender, active bowel sounds  Extremities: -edema  Skin: warm, dry, intact   Resolved Hospital Problem list   Sepsis   Assessment & Plan:   Acute Hypoxemic Respiratory Failure in setting of Strep Intermedius Community-acquired Pneumonia with Empyema  and Acute Lung Injury from Sepsis  -S/p intrapleural tPA, DNase 7/15, 7/16.  No significant effusion on CXR 7/17 -S/P tPA, DNase 7/23  -Chest Tube ~250 out over last 24 hours  Plan -Chest Tube Remains on Water Seal, Routine Dressing Changes   -Will Continue Chest tube for now - outpt is gradually decreasing- goal <158ml/24hrs.   -continuing Levaquin  -Titrate for Saturation Goal 88-92%  -Encourage good pulmonary hygiene   Discussed with pt at length 7/27   Remaining Management Per Primary.   Best practice:  Diet: Regular DVT prophylaxis: Lovenox Mobility: Bedrest Code Status: Full code Family Communication: Updated patient on plan of care 7/27.     Nickolas Madrid, NP Pulmonary/Critical Care Medicine  09/20/2019  11:56 AM

## 2019-09-20 NOTE — Progress Notes (Signed)
PROGRESS NOTE  Edwin Torres RKY:[id number] DOB: [birth year] DOA: 09/08/2019 PCP: Inda Coke, PA  Brief History   64 year old man PMH including alcohol use, seizure disorder, 3rd nerve palsy with chronic right eye ptosis, poor dentition, presented with shortness of breath.  Admitted for acute hypoxic respiratory failure secondary to pneumonia, empyema.  Status post right chest tube. Clinically stable on  oxygen, hospitalization prolonged by CT outpt and inability to remove. Once chest tube removed can go home.  A & P  Sepsis secondary to Strep CAP w/ empyema present on admission with associated acute hypoxic respiratory failure . Sepsis symptomatology resolved  --stable 24 hours on 2L HFNC, continue to wean as tolerated --continue abx --continue management per pulmonology.  Status post TPA 7/15, 7/16 and again 7/22. CT output 250 mL last 24 hours. Plan to remove CT when outpt <158mL in 24 hours. --Follow-up with dental medicine as an outpatient --Will need outpatient chest CT to follow-up resolution of pneumonia and can exclude underlying malignancy.  Normocytic anemia  --stable; secondary to chronic disease, f/u as an outpatient   Alcohol abuse --No evidence of withdrawal  Cigarette smoker --Cessation recommended  Chronic right eyelid ptosis --Follow-up as an outpatient  Hyponatremia  --stable. Probably secondary to acute lung process.  PMH seizure --Continue gabapentin  Disposition Plan:  Discussion: Oxygen requirement stable and now minimal. Continue chest tube management per pulmonology.   No opportunity for discharge until chest tube removed.  Status is: Inpatient  Remains inpatient appropriate because:Inpatient level of care appropriate due to severity of illness  Dispo: The patient is from: Home              Anticipated d/c is to: Home no PT needed              Anticipated d/c date is: 2 days              Patient currently is not medically stable to  d/c.  DVT prophylaxis:  enoxaparin (LOVENOX) injection 40 mg Start: 09/08/19 1100 SCDs Start: 09/08/19 1056 Code Status: Full Family Communication: I updated wife by phone 7/27  Murray Hodgkins, MD  [facility] Direct contact: see [email or web address] (further directions at bottom of note if needed) 7PM-7AM contact night coverage as at bottom of note 09/20/2019, 4:51 PM  LOS: 12 days   Significant Hospital Events   7/15 Admit, thoracentesis, chest tube placement with TPA 7/19 Pain at ct site / not able to wean off high flow 02 7/20 Chest Tube Placed to Water Seal    Consults:  . PCCM admitted 7/15, transferred care to [facility] 7/20    Procedures:  Thoracentesis 09/08/2019 significant for 250 cc of cloudy fluid R lytic rx  7/15, 7/16, 7/22  Significant Diagnostic Tests:  Pleural fluid from 7/15  Exudative with glucose <20   And wbc 97170 with 95% Polys  Micro Data:  COVID 19 PCR > Neg  MRSA PCR  7/15 > neg Pleural fluid culture 7/15 > strep intermedius > sensitive to levaquin  UC 7/15 > negative  BCx2  7/15 > negative  Antimicrobials:  Vancomycin 7/15 > 7/17 Levofloxacin  7/15 >>   Interval History/Subjective  Feels fine, breathing fine.   Objective   Vitals:  Vitals:   09/20/19 0556 09/20/19 1325  BP: 123/72 121/74  Pulse: 84 (!) 110  Resp: 16 16  Temp: 98.8 F (37.1 C) 98.3 F (36.8 C)  SpO2: 97% 97%    Exam: Constitutional:   . Appears calm  and comfortable. Appears to be getting better. Respiratory:  . CTA on left, no w/r/r. Improved breath sounds and air movement on the right. [place] Kitchen Respiratory effort normal.  Cardiovascular:  . RRR, no m/r/g . No LE extremity edema   Psychiatric:  . Mental status o Mood, affect appropriate  I have personally reviewed the following:   Today's Data  . UOP 1650 . CT 250  Scheduled Meds: . acetaminophen  1,000 mg Oral TID  . Chlorhexidine Gluconate Cloth  6 each Topical Daily  . enoxaparin (LOVENOX) injection  40 mg  Subcutaneous Q24H  . feeding supplement  1 Container Oral Q24H  . feeding supplement (ENSURE ENLIVE)  237 mL Oral BID BM  . gabapentin  100 mg Oral QHS  . levofloxacin  750 mg Oral Daily  . mouth rinse  15 mL Mouth Rinse BID  . multivitamin with minerals  1 tablet Oral Daily  . nicotine  21 mg Transdermal Daily   Continuous Infusions:   Principal Problem:   Empyema (HCC) Active Problems:   Acute hypoxemic respiratory failure ([place])   Chest tube in place   CAP (community acquired pneumonia)   Sepsis ([place])   Malnutrition of moderate degree   LOS: 12 days   How to contact the [facility] Attending or Consulting provider [place] or covering provider during after hours [place], for this patient?  1. Check the care team in [facility] and look for a) attending/consulting TRH provider listed and b) the [facility] team listed 2. Log into [email or web address] and use [place]'s universal password to access. If you do not have the password, please contact the hospital operator. 3. Locate the [facility] provider you are looking for under [facility] and page to a number that you can be directly reached. 4. If you still have difficulty reaching the provider, please page the [facility] (Director on Call) for the Hospitalists listed on amion for assistance.

## 2019-09-20 NOTE — TOC Progression Note (Signed)
Transition of Care [facility]) - Progression Note    Patient Details  Name: KACEE KOREN MRN: [id number] Date of Birth: Dec 14, 1955  Transition of Care [facility]) CM/SW Contact  Lennart Pall, LCSW Phone Number: 09/20/2019, 1:13 PM  Clinical Narrative:   Met with pt today to introduce my role and discuss potential dc needs.  Pt very eager to go home, however, aware that he is not yet medically ready for this.  He notes he has support from his wife who works from home.  He was independent with ADLs prior to this admission.  He does not drive.  Has an adult daughter living in [place].  Discussed concerns of his ETOH use and pt quickly states that he "hasn't had anything since December".  He has not utilized any community programs for support and notes that his wife's insistence that he stop "was enough for me."  He declines any handouts and notes he is familiar with resources, AA groups, etc should he feel the need to access them.   Pt agreeable to SW monitoring for d/c needs and will assist with any DME or [facility] referrals needed.  Continue to follow.    Expected Discharge Plan: [place] Barriers to Discharge: Continued Medical Work up  Expected Discharge Plan and Services Expected Discharge Plan: [place] In-house Referral: Clinical Social Work Discharge Planning Services: CM Consult   Living arrangements for the past 2 months: Single Family Home                                       Social Determinants of Health (SDOH) Interventions    Readmission Risk Interventions Readmission Risk Prevention Plan 09/20/2019  Post Dischage Appt Complete  Medication Screening Complete  Transportation Screening Complete  Some recent data might be hidden

## 2019-09-20 NOTE — Care Management Important Message (Signed)
Important Message  Patient Details IM Letter presented to the Patient Name: Edwin Torres MRN: [id number] Date of Birth: 01-Feb-1956   Medicare Important Message Given:  Yes     Kerin Salen 09/20/2019, 10:51 AM

## 2019-09-20 NOTE — Progress Notes (Signed)
Physical Therapy Treatment Patient Details Name: Edwin Torres MRN: [id number] DOB: 1955-08-12 Today's Date: 09/20/2019    History of Present Illness Patient is 64 year old man PMH including alcohol use, seizure disorder, 3rd nerve palsy with chronic right eye ptosis, poor dentition, presented with shortness of breath.  Admitted for acute hypoxic respiratory failure secondary to pneumonia, empyema.  Patient now s/p right chest tube on 09/15/19.    PT Comments    Patient is progressing steadily with acute PT. Patient continues to require min guard for gait and cues for safety as he is slightly impulsive at times. He ambulated increased distance on 2L/min and maintained sats >93% during gait. HR reached max of 140 bpm. Pt reported RPE of 4/10. He was educated on benefits of walking program to increase activity tolerance and endurance when he returns home. Acute PT will follow and progress as able.    Follow Up Recommendations  No PT follow up     Equipment Recommendations  Rolling walker with 5" wheels    Recommendations for Other Services       Precautions / Restrictions Precautions Precautions: Fall Restrictions Weight Bearing Restrictions: No    Mobility  Bed Mobility Overal bed mobility: Modified Independent             General bed mobility comments: HOB elevated, no assist required to raise trunk and bring LE's off EOB.   Transfers Overall transfer level: Needs assistance Equipment used: Rolling walker (2 wheeled) Transfers: Sit to/from Stand Sit to Stand: Supervision         General transfer comment: no assist required for power up or to rise. pt steady standing with RW.  Ambulation/Gait Ambulation/Gait assistance: Min guard Gait Distance (Feet): 220 Feet Assistive device: Rolling walker (2 wheeled) Gait Pattern/deviations: Step-through pattern;Decreased step length - right;Decreased step length - left;Decreased stride length Gait velocity: decr    General Gait Details: min guard for safety and VC's for safety with negotiating doorways and obstacles in hallway. pt requried cues to deter picking walker off the ground. no overt LOB during gait and pt steady with walker.   Stairs             Wheelchair Mobility    Modified Rankin (Stroke Patients Only)       Balance Overall balance assessment: Needs assistance Sitting-balance support: Feet supported Sitting balance-Leahy Scale: Good     Standing balance support: During functional activity;Bilateral upper extremity supported Standing balance-Leahy Scale: Fair         Cognition Arousal/Alertness: Awake/alert Behavior During Therapy: WFL for tasks assessed/performed Overall Cognitive Status: Within Functional Limits for tasks assessed           Exercises      General Comments        Pertinent Vitals/Pain Pain Assessment: No/denies pain           PT Goals (current goals can now be found in the care plan section) Acute Rehab PT Goals Patient Stated Goal: get back home PT Goal Formulation: With patient Time For Goal Achievement: 09/30/19 Potential to Achieve Goals: Good Progress towards PT goals: Progressing toward goals    Frequency    Min 3X/week      PT Plan Current plan remains appropriate       AM-PAC PT "6 Clicks" Mobility   Outcome Measure  Help needed turning from your back to your side while in a flat bed without using bedrails?: None Help needed moving from lying on your back to sitting  on the side of a flat bed without using bedrails?: None Help needed moving to and from a bed to a chair (including a wheelchair)?: A Little Help needed standing up from a chair using your arms (e.g., wheelchair or bedside chair)?: A Little Help needed to walk in hospital room?: A Little Help needed climbing 3-5 steps with a railing? : A Little 6 Click Score: 20    End of Session Equipment Utilized During Treatment: Oxygen Activity Tolerance:  Patient tolerated treatment well Patient left: in chair;with call bell/phone within reach;with chair alarm set Nurse Communication: Mobility status PT Visit Diagnosis: Unsteadiness on feet (R26.81);Muscle weakness (generalized) (M62.81);Difficulty in walking, not elsewhere classified (R26.2)     Time: 7673-4193 PT Time Calculation (min) (ACUTE ONLY): 26 min  Charges:  $Gait Training: 8-22 mins $Therapeutic Activity: 8-22 mins                     Verner Mould, DPT Acute Rehabilitation Services  Office [phone] Pager [phone]  09/20/2019 2:29 PM

## 2019-09-21 ENCOUNTER — Inpatient Hospital Stay (HOSPITAL_COMMUNITY): Payer: PPO

## 2019-09-21 DIAGNOSIS — J9601 Acute respiratory failure with hypoxia: Secondary | ICD-10-CM

## 2019-09-21 LAB — COMPREHENSIVE METABOLIC PANEL
ALT: 17 U/L (ref 0–44)
AST: 27 U/L (ref 15–41)
Albumin: 2.3 g/dL — ABNORMAL LOW (ref 3.5–5.0)
Alkaline Phosphatase: 67 U/L (ref 38–126)
Anion gap: 11 (ref 5–15)
BUN: 12 mg/dL (ref 8–23)
CO2: 29 mmol/L (ref 22–32)
Calcium: 8.5 mg/dL — ABNORMAL LOW (ref 8.9–10.3)
Chloride: 95 mmol/L — ABNORMAL LOW (ref 98–111)
Creatinine, Ser: 0.42 mg/dL — ABNORMAL LOW (ref 0.61–1.24)
GFR calc Af Amer: >60 mL/min (ref >60)
GFR calc non Af Amer: >60 mL/min (ref >60)
Glucose, Bld: 123 mg/dL — ABNORMAL HIGH (ref 70–99)
Potassium: 3.9 mmol/L (ref 3.5–5.1)
Sodium: 135 mmol/L (ref 135–145)
Total Bilirubin: 0.2 mg/dL — ABNORMAL LOW (ref 0.3–1.2)
Total Protein: 6.2 g/dL — ABNORMAL LOW (ref 6.5–8.1)

## 2019-09-21 LAB — CBC
HCT: 24.8 % — ABNORMAL LOW (ref 39.0–52.0)
Hemoglobin: 7.8 g/dL — ABNORMAL LOW (ref 13.0–17.0)
MCH: 26.5 pg (ref 26.0–34.0)
MCHC: 31.5 g/dL (ref 30.0–36.0)
MCV: 84.4 fL (ref 80.0–100.0)
Platelets: 738 10*3/uL — ABNORMAL HIGH (ref 150–400)
RBC: 2.94 MIL/uL — ABNORMAL LOW (ref 4.22–5.81)
RDW: 17.2 % — ABNORMAL HIGH (ref 11.5–15.5)
WBC: 13.7 10*3/uL — ABNORMAL HIGH (ref 4.0–10.5)
nRBC: 0 % (ref 0.0–0.2)

## 2019-09-21 NOTE — Progress Notes (Signed)
NAME:  Edwin Torres, MRN:  [id number], DOB:  05/21/1955, LOS: 12 ADMISSION DATE:  09/08/2019, CONSULTATION DATE:  09/08/2019 REFERRING MD:  Dr Milton Ferguson, CHIEF COMPLAINT:  SOB   Brief History   64 year old with history of depression, alcohol use, seizures and poor dentition admitted with community-acquired pneumonia and empyema s/p chest tube placement and TPA.  History of 3rd nerve palsy dates back to 2018 as per notes from PCP office    Past Medical History  ETOH Abuse  Melanoma - on face, s/p removal  Depression  Genital Herpes  Seizures Right eye ptosis - 3rd nerve palsy, dates back to [facility]   7/15 Admit, thoracentesis, chest tube placement with TPA 7/19 Pain at ct site / not able to wean off high flow 02 7/20 Chest Tube Placed to Water Seal   Consults:  PCCM  Procedures:  Thoracentesis 09/08/2019 significant for 250 cc of cloudy fluid R lytic rx  7/15 R Lytic rx 7/16  R Lytic rx 7/23  Significant Diagnostic Tests:  Pleural fluid from 7/15  Exudative with glucsoe <20   And wbc 97170 with 95% Polys  Micro Data:  COVID 19 PCR > Neg  MRSA PCR  7/15 > neg Pleural fluid culture 7/15 > strep intermedius > sensitive to levaquin  UC 7/15 > negative  BCx2  7/15 > negative  Antimicrobials:  Vancomycin 7/15 > 7/17 Levofloxacin  7/15 >>   Interim history/subjective:  Output from chest tube has been 50cc over 24 hrs. No complaints  Objective   Blood pressure 118/74, pulse 74, temperature 98.1 F (36.7 C), temperature source Oral, resp. rate 18, height 5\' 5"  (1.651 m), weight 54.8 kg, SpO2 99 %.        Intake/Output Summary (Last 24 hours) at 09/21/2019 1700 Last data filed at 09/21/2019 1313 Gross per 24 hour  Intake 1080 ml  Output 2065 ml  Net -985 ml   Filed Weights   09/13/19 0500 09/14/19 0500 09/17/19 4580  Weight: 43.4 kg 44.4 kg 54.8 kg    Examination:. Gen:      No acute distress HEENT:  EOMI, sclera anicteric, rt  ptosis Neck:     No masses; no thyromegaly Lungs:    Clear to auscultation bilaterally; normal respiratory effort CV:         Regular rate and rhythm; no murmurs Abd:      + bowel sounds; soft, non-tender; no palpable masses, no distension Ext:    No edema; adequate peripheral perfusion Skin:      Warm and dry; no rash Neuro: alert and oriented x 3 Psych: normal mood and affect  Resolved Hospital Problem list   Sepsis   Assessment & Plan:   Acute Hypoxemic Respiratory Failure in setting of Strep Intermedius Community-acquired Pneumonia with Empyema  and Acute Lung Injury from Sepsis  -S/p intrapleural tPA, DNase 7/15, 7/16.  No significant effusion on CXR 7/17 -S/P tPA, DNase 7/23  -Chest Tube ~250 out over last 24 hours  Plan -Chest Tube Remains on Water Seal, Routine Dressing Changes   -Has 50cc out over 24 hrs.  CXR is still minimal hydropneumothroax which will likely resolve over time. Clinically he is much improved  -continuing Levaquin  -Titrate for Saturation Goal 88-92%  -Encourage good pulmonary hygiene   Remaining Management Per Primary.   Best practice:  Diet: Regular DVT prophylaxis: Lovenox Mobility: Bedrest Code Status: Full code Family Communication: Updated patient on plan of care 7/28  Marshell Garfinkel MD [facility] Please see [email or web address] for pager details.  09/21/2019, 5:00 PM

## 2019-09-21 NOTE — Progress Notes (Addendum)
PROGRESS NOTE    Edwin Torres  EXB:[id number] DOB: [birth year] DOA: 09/08/2019 PCP: Inda Coke, PA   Chief Complaint  Patient presents with  . Shortness of Breath  . Cough    Brief Narrative:  64 year old man PMH including alcohol use, seizure disorder, 3rd nerve palsy with chronic right eye ptosis, poor dentition, presented with shortness of breath.  Admitted for acute hypoxic respiratory failure secondary to pneumonia, empyema.  Status post right chest tube. Clinically stable on  oxygen, hospitalization prolonged by CT outpt and inability to remove. Once chest tube removed can go home.  Significant Events 7/15 Admit, thoracentesis, chest tube placement with TPA 7/19 Pain at ct site / not able to wean off high flow 02 7/20 Chest Tube Placed to Water Seal  Assessment & Plan:   Principal Problem:   Empyema ([place]) Active Problems:   Acute hypoxemic respiratory failure ([place])   Chest tube in place   CAP (community acquired pneumonia)   Sepsis ([place])   Malnutrition of moderate degree  Sepsis secondary to Strep CAP w/ empyema present on admission with associated acute hypoxic respiratory failure . Sepsis symptomatology resolved  --stable 24 hours on 2L HFNC, continue to wean as tolerated --continue abx --continue management per pulmonology.  Status post TPA 7/15, 7/16 and again 7/22.  Plan to remove CT when outpt <163mL in 24 hours, per pulm. --Follow-up with dental medicine as an outpatient --Will need outpatient chest CT to follow-up resolution of pneumonia and can exclude underlying malignancy.  Normocytic anemia  --relatively stable; secondary to chronic disease, f/u as an outpatient   Alcohol abuse --No evidence of withdrawal  Cigarette smoker --Cessation recommended  Chronic right eyelid ptosis --Follow-up as an outpatient  Hyponatremia  --stable. Probably secondary to acute lung process.  PMH seizure --Continue gabapentin  DVT prophylaxis:  lovenox Code Status: full  Family Communication: none at bedside - wife over phone Disposition:   Status is: Inpatient  Remains inpatient appropriate because:Inpatient level of care appropriate due to severity of illness   Dispo: The patient is from: Home              Anticipated d/c is to: Home              Anticipated d/c date is: 1 day              Patient currently is not medically stable to d/c.   Consultants:   pulm   Procedures: Thoracentesis 09/08/2019 significant for 250 cc of cloudy fluid R lytic rx 7/15, 7/16, 7/22  Antimicrobials:  Anti-infectives (From admission, onward)   Start     Dose/Rate Route Frequency Ordered Stop   09/15/19 1000  levofloxacin (LEVAQUIN) tablet 750 mg     Discontinue     750 mg Oral Daily 09/14/19 1317     09/09/19 2230  vancomycin (VANCOREADY) IVPB 750 mg/150 mL  Status:  Discontinued        750 mg 150 mL/hr over 60 Minutes Intravenous Every 12 hours 09/09/19 0937 09/10/19 0911   09/09/19 1030  vancomycin (VANCOREADY) IVPB 1250 mg/250 mL        1,250 mg 166.7 mL/hr over 90 Minutes Intravenous  Once 09/09/19 0937 09/09/19 1138   09/09/19 1000  levofloxacin (LEVAQUIN) IVPB 750 mg  Status:  Discontinued        750 mg 100 mL/hr over 90 Minutes Intravenous Every 24 hours 09/08/19 1921 09/14/19 1317   09/08/19 0900  levofloxacin (LEVAQUIN) IVPB 750 mg  750 mg 100 mL/hr over 90 Minutes Intravenous  Once 09/08/19 0848 09/08/19 1035   09/08/19 0900  vancomycin (VANCOCIN) IVPB 1000 mg/200 mL premix        1,000 mg 200 mL/hr over 60 Minutes Intravenous  Once 09/08/19 0848 09/08/19 1116     Subjective: No complaints today  Objective: Vitals:   09/20/19 1325 09/20/19 2054 09/21/19 0547 09/21/19 1310  BP: 121/74 (!) 133/78 125/70 118/74  Pulse: (!) 110 94 70 74  Resp: 16 18 18    Temp: 98.3 F (36.8 C) 98.4 F (36.9 C) 98.2 F (36.8 C) 98.1 F (36.7 C)  TempSrc:  Oral  Oral  SpO2: 97% 96% 97% 99%  Weight:      Height:         Intake/Output Summary (Last 24 hours) at 09/21/2019 1335 Last data filed at 09/21/2019 1313 Gross per 24 hour  Intake 1080 ml  Output 1465 ml  Net -385 ml   Filed Weights   09/13/19 0500 09/14/19 0500 09/17/19 0605  Weight: 43.4 kg 44.4 kg 54.8 kg    Examination:  General exam: Appears calm and comfortable  Respiratory system: Clear to auscultation. Respiratory effort normal.  Right sided chest tube. Cardiovascular system: S1 & S2 heard, RRR Gastrointestinal system: Abdomen is nondistended, soft and nontender.  Central nervous system: Alert and oriented. No focal neurological deficits. Extremities: no LEE Skin: No rashes, lesions or ulcers Psychiatry: Judgement and insight appear normal. Mood & affect appropriate.     Data Reviewed: I have personally reviewed following labs and imaging studies  CBC: Recent Labs  Lab 09/15/19 0446 09/16/19 0503 09/17/19 0701 09/18/19 0553 09/21/19 0959  WBC 18.0* 23.3* 15.4* 14.4* 13.7*  HGB 8.9* 10.1* 8.3* 8.1* 7.8*  HCT 27.2* 31.4* 25.4* 26.1* 24.8*  MCV 82.7 83.5 83.3 85.6 84.4  PLT 562* 785* 683* 758* 738*    Basic Metabolic Panel: Recent Labs  Lab 09/15/19 0446 09/16/19 0503 09/17/19 0701 09/18/19 0553 09/21/19 0959  NA 134* 132* 130* 132* 135  K 3.3* 4.9 4.6 3.7 3.9  CL 95* 94* 92* 96* 95*  CO2 27 27 29 27 29   GLUCOSE 100* 120* 94 98 123*  BUN 11 13 12 12 12   CREATININE 0.46* 0.55* 0.59* 0.55* 0.42*  CALCIUM 7.9* 8.2* 8.1* 8.1* 8.5*  MG 1.6*  --   --   --   --   PHOS 2.9  --   --   --   --     GFR: Estimated Creatinine Clearance: 71.4 mL/min (Sharvil Hoey) (by C-G formula based on SCr of 0.42 mg/dL (L)).  Liver Function Tests: Recent Labs  Lab 09/21/19 0959  AST 27  ALT 17  ALKPHOS 67  BILITOT 0.2*  PROT 6.2*  ALBUMIN 2.3*    CBG: No results for input(s): GLUCAP in the last 168 hours.   No results found for this or any previous visit (from the past 240 hour(s)).       Radiology Studies: No results  found.      Scheduled Meds: . acetaminophen  1,000 mg Oral TID  . Chlorhexidine Gluconate Cloth  6 each Topical Daily  . enoxaparin (LOVENOX) injection  40 mg Subcutaneous Q24H  . feeding supplement  1 Container Oral Q24H  . feeding supplement (ENSURE ENLIVE)  237 mL Oral BID BM  . gabapentin  100 mg Oral QHS  . levofloxacin  750 mg Oral Daily  . mouth rinse  15 mL Mouth Rinse BID  . multivitamin  with minerals  1 tablet Oral Daily  . nicotine  21 mg Transdermal Daily   Continuous Infusions:   LOS: 13 days    Time spent: over 30 min    Fayrene Helper, MD [facility]   To contact the attending provider between 7A-7P or the covering provider during after hours 7P-7A, please log into the web site [email or web address] and access using universal [place] password for that web site. If you do not have the password, please call the hospital operator.  09/21/2019, 1:35 PM

## 2019-09-21 NOTE — Progress Notes (Addendum)
Nutrition Follow-up  DOCUMENTATION CODES:   Non-severe (moderate) malnutrition in context of social or environmental circumstances, Underweight  INTERVENTION:   -Boost Breeze po daily, each supplement provides 250 kcal and 9 grams of protein -Ensure Enlive po BID, each supplement provides 350 kcal and 20 grams of protein -Multivitamin with minerals daily  NUTRITION DIAGNOSIS:   Moderate Malnutrition related to social / environmental circumstances (alcohol abuse) as evidenced by mild fat depletion, moderate fat depletion, moderate muscle depletion, severe muscle depletion.  Ongoing.  GOAL:   Patient will meet greater than or equal to 90% of their needs  Progressing.  MONITOR:   PO intake, Supplement acceptance, Labs, Weight trends  ASSESSMENT:   64 year old man medical history of alcohol abuse, seizure disorder, 3rd nerve palsy with chronic R eye ptosis, and poor dentition. He presented to the ED with shortness of breath and was admitted for acute hypoxic respiratory failure 2/2 PNA and empyema. S/p R chest tube.  Patient currently consuming 40-100% of meals at this time. Pt is drinking supplements ordered.  Admission weight: 125 lbs. Current weight: 120 lbs.  I/Os: -5.5L since admit UOP: 1425 ml x 24 hrs Chest tube: 40 ml  Labs reviewed. Medications: Multivitamin with minerals daily  Diet Order:   Diet Order            Diet regular Room service appropriate? Yes; Fluid consistency: Thin  Diet effective now                 EDUCATION NEEDS:   No education needs have been identified at this time  Skin:  Skin Assessment: Reviewed RN Assessment  Last BM:  7/26  Height:   Ht Readings from Last 1 Encounters:  09/08/19 5\' 5"  (1.651 m)    Weight:   Wt Readings from Last 1 Encounters:  09/17/19 54.8 kg   BMI:  Body mass index is 20.1 kg/m.  Estimated Nutritional Needs:   Kcal:  1730-1980 kcal (28-32 kcal/kg IBW)  Protein:  90-105 grams  Fluid:  >/=  1.9 L/day  Clayton Bibles, MS, RD, LDN Inpatient Clinical Dietitian Contact information available via Amion

## 2019-09-22 DIAGNOSIS — R0689 Other abnormalities of breathing: Secondary | ICD-10-CM

## 2019-09-22 LAB — CBC WITH DIFFERENTIAL/PLATELET
Abs Immature Granulocytes: 0.07 10*3/uL (ref 0.00–0.07)
Basophils Absolute: 0.1 10*3/uL (ref 0.0–0.1)
Basophils Relative: 1 %
Eosinophils Absolute: 0.6 10*3/uL — ABNORMAL HIGH (ref 0.0–0.5)
Eosinophils Relative: 5 %
HCT: 25.2 % — ABNORMAL LOW (ref 39.0–52.0)
Hemoglobin: 7.6 g/dL — ABNORMAL LOW (ref 13.0–17.0)
Immature Granulocytes: 1 %
Lymphocytes Relative: 22 %
Lymphs Abs: 3.1 10*3/uL (ref 0.7–4.0)
MCH: 26.4 pg (ref 26.0–34.0)
MCHC: 30.2 g/dL (ref 30.0–36.0)
MCV: 87.5 fL (ref 80.0–100.0)
Monocytes Absolute: 1.8 10*3/uL — ABNORMAL HIGH (ref 0.1–1.0)
Monocytes Relative: 13 %
Neutro Abs: 8.5 10*3/uL — ABNORMAL HIGH (ref 1.7–7.7)
Neutrophils Relative %: 58 %
Platelets: 722 10*3/uL — ABNORMAL HIGH (ref 150–400)
RBC: 2.88 MIL/uL — ABNORMAL LOW (ref 4.22–5.81)
RDW: 17.4 % — ABNORMAL HIGH (ref 11.5–15.5)
WBC: 14.2 10*3/uL — ABNORMAL HIGH (ref 4.0–10.5)
nRBC: 0 % (ref 0.0–0.2)

## 2019-09-22 LAB — COMPREHENSIVE METABOLIC PANEL
ALT: 17 U/L (ref 0–44)
AST: 20 U/L (ref 15–41)
Albumin: 2.5 g/dL — ABNORMAL LOW (ref 3.5–5.0)
Alkaline Phosphatase: 75 U/L (ref 38–126)
Anion gap: 9 (ref 5–15)
BUN: 9 mg/dL (ref 8–23)
CO2: 26 mmol/L (ref 22–32)
Calcium: 8.5 mg/dL — ABNORMAL LOW (ref 8.9–10.3)
Chloride: 100 mmol/L (ref 98–111)
Creatinine, Ser: 0.46 mg/dL — ABNORMAL LOW (ref 0.61–1.24)
GFR calc Af Amer: >60 mL/min (ref >60)
GFR calc non Af Amer: >60 mL/min (ref >60)
Glucose, Bld: 80 mg/dL (ref 70–99)
Potassium: 3.9 mmol/L (ref 3.5–5.1)
Sodium: 135 mmol/L (ref 135–145)
Total Bilirubin: 0.6 mg/dL (ref 0.3–1.2)
Total Protein: 6.3 g/dL — ABNORMAL LOW (ref 6.5–8.1)

## 2019-09-22 LAB — IRON AND TIBC
Iron: 30 ug/dL — ABNORMAL LOW (ref 45–182)
Saturation Ratios: 9 % — ABNORMAL LOW (ref 17.9–39.5)
TIBC: 335 ug/dL (ref 250–450)
UIBC: 305 ug/dL

## 2019-09-22 LAB — PHOSPHORUS: Phosphorus: 4.4 mg/dL (ref 2.5–4.6)

## 2019-09-22 LAB — MAGNESIUM: Magnesium: 1.9 mg/dL (ref 1.7–2.4)

## 2019-09-22 LAB — VITAMIN B12: Vitamin B-12: 282 pg/mL (ref 180–914)

## 2019-09-22 LAB — FOLATE: Folate: 8.9 ng/mL (ref >5.9)

## 2019-09-22 LAB — HEMOGLOBIN AND HEMATOCRIT, BLOOD
HCT: 26.6 % — ABNORMAL LOW (ref 39.0–52.0)
Hemoglobin: 8.3 g/dL — ABNORMAL LOW (ref 13.0–17.0)

## 2019-09-22 LAB — FERRITIN: Ferritin: 71 ng/mL (ref 24–336)

## 2019-09-22 MED ORDER — FUROSEMIDE 20 MG PO TABS
20.0000 mg | ORAL_TABLET | Freq: Every day | ORAL | 0 refills | Status: DC
Start: 2019-09-22 — End: 2019-10-06

## 2019-09-22 MED ORDER — FERROUS SULFATE 325 (65 FE) MG PO TABS: 325.0000 mg | ORAL_TABLET | Freq: Every day | ORAL | 0 refills | Status: DC

## 2019-09-22 MED ORDER — LEVOFLOXACIN 750 MG PO TABS: 750.0000 mg | ORAL_TABLET | Freq: Every day | ORAL | 0 refills | Status: AC

## 2019-09-22 NOTE — Discharge Summary (Addendum)
Physician Discharge Summary  Edwin Torres GBT:[id number] DOB: [birth year] DOA: 09/08/2019  PCP: Edwin Coke, PA  Admit date: 09/08/2019 Discharge date: 09/22/2019  Time spent: 40 minutes  Recommendations for Outpatient Follow-up:  1. Follow outpatient CBC/CMP 2. Follow with pulmonology outpatient  3. Continue levaquin x 14 days - consider extending therapy at follow up with pulmonology 4. Will need CT scan in follow up to ensure clearance and no underlying mass 5. Follow CXR outpatient - edema vs infection - discharged with 5 days lasix - needs repeat labs and imaging once complete - additional w/u as indicated (likely would benefit from echo outpatient) 6. Will need follow up with dental as an outpatient    7. Follow anemia outpatient - pending folate test at time of discharge - started on iron for deficiency anemia 8. 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 Admitted for acute hypoxic respiratory failure secondary to pneumonia, empyema. Status post right chest tube. Clinically stable on [place] oxygen, hospitalization prolonged by CT outpt and inability to remove. Once chest tube removed can go home.  Significant Events 7/15 Admit, thoracentesis, chest tube placement with TPA 7/19 Pain at ct site / not able to wean off high flow 02 7/20 Chest Tube Placed  to [place]  He was admitted with pneumonia and an empyema.  He's s/p chest tube placement and tpa.  He's improved with antibiotics and chest tube.  Chest tube removed on 7/28.  Plan for additional 14 days of levaquin with follow outpatient with pulmonology.  He was requiring 2 L oxygen on the day of discharge.  Plan for outpatient follow up.   Hospital Course:  Sepsis secondary to Strep CAP w/ empyema present on admission with associated acute hypoxic respiratory failure  Chronic Respiratory Failure . Sepsis symptomatology resolved  -- required 2 L to maintain O2 sats above 88% with activity, continue 2 L at rest and with activity at home --continueabx - s/p 15 days levaquin, will d/c with additional 14 days --continuemanagement per pulmonology. Status post TPA 7/15, 7/16 and again 7/22. Chest tube removed 7/28.  CXR 2/28 with s/p R chest tube removal with probable re-accumulation of R lung base pleural effusion, but no pneumothorax identified.  Coarse bilateral pulmonary interstitial opacity with viral/atypical resp infection and edema.  --Follow-up with dental medicine as an outpatient --Will need outpatient chest CT to follow-up resolution of pneumonia and can exclude underlying malignancy.  Coarse bilateral pulmonary interstitial opacity: concern for atypical infection vs edema.  BNP was elevated on 7/15.  He received 2 doses of IV lasix earlier in the course and appears to have tolerated this well (40 mg on 7/21 and 20 mg IV on 7/22).  Does not appear grossly volume overloaded. Will discharge with lasix 20 mg PO daily x 5 days Needs outpatient CXR and labs after course of lasix Would follow echo outpatient  Sinus Tachycardia: tachycardic with activity, but normal HR at rest, follow outpatient - likely multifactorial 2/2 pneumonia and anemia   Normocytic anemia Iron Deficiency Anemia --relatively stable; secondary to chronic disease, f/u as an outpatient  - folate pending -  discharge with supplemental iron - needs routine CRC screening, cancer screening  Alcohol abuse --No evidence of withdrawal  Cigarette smoker --Cessation recommended  Chronic right eyelid ptosis --Follow-up as an outpatient  Hyponatremia --stable. Probably secondary to acute lung process.  PMH seizure --Continue gabapentin  Procedures: Thoracentesis 09/08/2019 significant for 250 cc of cloudy fluid R lytic rx 7/15,7/16, 7/22  Consultations:  pulm  Discharge Exam: Vitals:   09/22/19 1200 09/22/19 1253  BP:  (!) 104/59  Pulse:  98  Resp:    Temp:  98 F (36.7 C)  SpO2: 94% 99%   Eager to go home No new complaints Discussed d/c and recs with pt and wife  General: No acute distress. Cardiovascular: Heart sounds show [name] regular rate, and rhythm.  Lungs: diminished, dressing from chest tube Abdomen: Soft, nontender, nondistended . Neurological: Alert and oriented 3. Moves all extremities 4 . Cranial nerves II through XII grossly intact. Skin: Warm and dry. No rashes or lesions. Extremities: No clubbing or cyanosis. No edema.   Discharge Instructions   Discharge Instructions    Ambulatory referral to Pulmonology   Complete by: As directed    Reason for referral: Pleural Effusion Comment - empyema   Call MD for:  difficulty breathing, headache or visual disturbances   Complete by: As directed    Call MD for:  extreme fatigue   Complete by: As directed    Call MD for:  hives   Complete by: As directed    Call MD for:  persistant dizziness or light-headedness   Complete by: As directed    Call MD for:  persistant nausea and vomiting   Complete by: As directed    Call MD for:  redness, tenderness, or signs of infection (pain, swelling, redness, odor or green/yellow discharge around incision site)   Complete by: As directed    Call MD for:  severe uncontrolled pain   Complete by: As directed    Call MD for:  temperature >100.4   Complete by: As  directed    Diet - low sodium heart healthy   Complete by: As directed    Discharge instructions   Complete by: As directed    You were seen for an empyema.  You were treated with [name] chest tube and antibiotics.  We'll send you home with antibiotics for another 2 weeks.  Please follow up with pulmonology as an outpatient.  You'll need repeat chest CT imaging and depending on how you're doing, your antibiotic course maybe extended.   You are now needing oxygen.  Use 2 liters with activity and at rest.  You have low blood counts (anemia).  Please follow your pending studies (iron, b12, folate, ferritin) with your PCP to determine if you need any supplementation.  You should follow with your PCP to determine if you need additional workup or testing for your low blood counts.  Your chest x ray showed some possible extra fluid.  We'll send you with 5 days of lasix as well.  Please follow up with your PCP for repeat x ray and labs after you complete this, they'll let you know if you need to continue this medication or follow additional testing.  Return for new, recurrent, and worsening symptoms.  Please ask your PCP to request records from this hospitalization so they know what was done and what the next steps will be.   Increase activity slowly   Complete by: As directed      Allergies as of 09/22/2019      Reactions   Penicillins Anaphylaxis   Has patient had Deaysia Grigoryan PCN reaction causing immediate rash, facial/tongue/throat swelling, SOB or lightheadedness with hypotension: Yes Has patient had Julyan Gales PCN reaction causing severe rash involving mucus membranes or skin necrosis: No Has patient had Beyza Bellino PCN reaction that required hospitalization: Yes Has patient had Annslee Tercero PCN reaction occurring within the last 10 years: No If all of the above answers are "NO", then may proceed with Cephalosporin use.   Ativan [lorazepam] Other (See Comments)   Made him crazy      Medication List    TAKE these medications    amitriptyline 10 MG tablet Commonly known as: ELAVIL Take 1 tablet (10 mg total) by mouth at bedtime.   feeding supplement (ENSURE ENLIVE) Liqd Take 237 mLs by mouth 2 (two) times daily between meals.   ferrous sulfate 325 (65 FE) MG tablet Take 1 tablet (325 mg total) by mouth daily with breakfast.   furosemide 20 MG tablet Commonly known as: Lasix Take 1 tablet (20 mg total) by mouth daily for 5 days. Please follow up with your PCP for repeat labs and CXR once this is complete   gabapentin 100 MG capsule Commonly known as: NEURONTIN Take 1 capsule at bedtime. May increase as tolerated by 100 mg to Ikenna Ohms maximum of 300 mg nightly. What changed:   how much to take  how to take this  when to take this   ibuprofen 200 MG tablet Commonly known as: ADVIL Take 600 mg by mouth every 6 (six) hours as needed for headache or moderate pain.   levofloxacin 750 MG tablet Commonly known as: LEVAQUIN Take 1 tablet (750 mg total) by mouth daily for 14 days. Start taking on: September 23, 2019            Durable Medical Equipment  (From admission, onward)         Start     Ordered   09/22/19 1355  DME Oxygen  Once       Comments: Patient needs O2 because he desats on room air at rest to 87% and with room air with activity to 83-86%.  His oxygen improved to 90% with 2 L with activity.  Question Answer Comment  Length of Need 12 Months   Mode or (Route) Nasal cannula   Liters per Minute 2   Frequency Continuous (stationary and portable oxygen unit needed)   Oxygen delivery system Gas      09/22/19 1400         Allergies  Allergen Reactions  . Penicillins Anaphylaxis    Has patient had Mehul Rudin PCN reaction causing immediate rash, facial/tongue/throat swelling, SOB or lightheadedness with hypotension: Yes Has patient had Sparsh Callens PCN reaction causing severe rash involving mucus membranes or skin necrosis: No Has patient had Yasser Hepp PCN reaction that required hospitalization: Yes Has patient had Venetia Prewitt  PCN reaction occurring within the last 10 years: No If all of the above answers are "NO", then may proceed with Cephalosporin use.   . Ativan [Lorazepam] Other (See Comments)    Made him crazy    Follow-up Information    Parrett, Tammy S, NP Follow up on 10/06/2019.   Specialty: Pulmonary Disease Why: Appt  8/12 at 11:30 AM for pulmoanry follow up Contact information: [address] [place] [address] [phone]        Edwin Coke, PA Follow up.   Specialty: Physician Assistant Contact information: [place] [id number] [phone]                The results of significant diagnostics from this hospitalization (including imaging, microbiology, ancillary and laboratory) are listed below for reference.    Significant Diagnostic Studies: CT CHEST WO CONTRAST  Result Date: 09/14/2019 CLINICAL DATA:  Shortness of breath, respiratory failure, hydropneumothorax EXAM: CT CHEST WITHOUT CONTRAST TECHNIQUE: Multidetector CT imaging of the chest was performed following the standard protocol without IV contrast. COMPARISON:  Chest x-ray 09/13/2019, CT 09/08/2019 FINDINGS: Cardiovascular: Heart size is normal. No pericardial effusion. Thoracic aorta is nonaneurysmal. Coronary artery calcification. Pulmonary trunk is nondilated. Mediastinum/Nodes: Mildly prominent mediastinal lymph nodes again including paratracheal, AP window, and subcarinal locations. Evaluation of the hilar structures is limited in the absence of intravenous contrast. No axillary adenopathy. Trachea and thyroid gland within normal limits. Esophagus is unremarkable. Lungs/Pleura: Right-sided chest tube in place with distal tip terminating the posterior aspect of the right upper hemithorax. Small right hydropneumothorax with basilar air component. Multiple bubbles of air within the dependent most portion of the pleural effusion may be related to pneumothorax. Dominic Mahaney superimposed infection of  the pleural fluid/empyema not excluded. Pleural effusion has significantly decreased in size compared to prior CT. Evaluation is limited in the absence of IV contrast. Diffuse ground-glass opacities throughout both lungs. The patchy and nodular opacities seen on previous CT have nearly resolved. There is Dorien Bessent few remaining areas of nodular airspace opacity including at the right lung apex (series 7, image 34). No left-sided pleural effusion or pneumothorax. Upper Abdomen: No acute abnormality. Musculoskeletal: Remote right seventh rib fracture. Scoliotic thoracic curvature, convex to the right. Similar degree of multilevel spondylosis within the thoracic spine and visualized portions of the cervical and lumbar spines. No acute osseous findings. IMPRESSION: 1. Small right hydropneumothorax with basilar air component. Multiple bubbles of air within the dependent most portion of the pleural effusion may be related to pneumothorax, however Rilie Glanz superimposed infection of the pleural fluid/empyema not excluded. 2. Diffuse ground-glass attenuation throughout both lung fields most suggestive of pulmonary edema. 3. The patchy and nodular opacities seen on previous CT have nearly resolved. 4. Mildly prominent mediastinal lymph nodes, likely reactive. Electronically Signed   By: Davina Poke D.O.   On: 09/14/2019 15:14   CT CHEST W CONTRAST  Result Date: 09/08/2019 CLINICAL DATA:  Pleural effusion, respiratory distress, cough, dyspnea EXAM: CT CHEST WITH CONTRAST TECHNIQUE: Multidetector CT imaging of the chest was performed during intravenous contrast administration. CONTRAST:  56mL OMNIPAQUE IOHEXOL 300 MG/ML  SOLN COMPARISON:  None. Findings are correlated with chest radiographs of 09/08/2019 and 07/12/2019 FINDINGS: Cardiovascular: Moderate coronary artery calcification within the left anterior descending coronary artery. Global cardiac size within normal limits. No pericardial effusion. Thoracic vasculature is  unremarkable. Mediastinum/Nodes: There is shotty bilateral hilar, aortopulmonary, and subcarinal adenopathy without frank pathologic thoracic adenopathy identified. These may be reactive in nature. Visualized thyroid gland is unremarkable. There is mild gas is distension of the esophagus which is nonspecific. The esophagus is otherwise unremarkable. Lungs/Pleura: Large, partially loculated, complex right pleural effusion is present demonstrating pleural thickening and enhancement, best noted at the right lung base, particularly involving the diaphragmatic parietal pleura. This suggests Baylei Siebels complex etiology with hemorrhagic or  proteinaceous components. Super infection would be difficult to exclude. There is resultant compressive atelectasis of the right lung base with subtotal collapse of the right middle and lower lobes. There is superimposed extensive ground-glass pulmonary infiltrate throughout the left lung with Mishael Krysiak more patchy distribution within the right upper lobe and superimposed peribronchovascular and subpleural nodularity suspicious for atypical infection in the acute setting. If chronic, these findings can be seen in the setting of lymphoproliferative disorders, leukemia, or granulomatous inflammation such as sarcoidosis. There is superimposed smooth interlobular septal thickening noted within the left lung most in keeping with superimposed asymmetric pulmonary edema. There is no pneumothorax. The central airways are widely patent. Upper Abdomen: No acute abnormality. In particular, there is no pathologic adenopathy identified and the spleen is of normal size. Musculoskeletal: Remote right seventh rib fracture is seen posteriorly. Degenerative changes are seen within the thoracic spine. No lytic or blastic bone lesions are seen. IMPRESSION: Large right complex pleural effusion suggesting hemorrhagic or proteinaceous components. While partially loculated, the collection appears predominantly posterior basal  in location. Super infection is difficult to exclude. Extensive asymmetric pulmonary infiltrate and peribronchial nodularity. See differential considerations above. In the acute setting, this is most in keeping with atypical infection. Asymmetric mild to moderate interstitial pulmonary edema involving the left lung. Electronically Signed   By: Fidela Salisbury MD   On: 09/08/2019 15:51   DG CHEST PORT 1 VIEW  Result Date: 09/21/2019 CLINICAL DATA:  64 year old male with right hydropneumothorax, right chest tube now removed. EXAM: PORTABLE CHEST 1 VIEW COMPARISON:  Chest CT 09/14/2019, portable chest 09/13/2019 and earlier. FINDINGS: Portable AP semi upright view at 2126 hours. The small caliber right chest tube seen on the prior CT is no longer identified. There is persistent veiling opacity at the right lung base, but no pneumothorax identified. Stable lung volumes. Stable cardiac size and mediastinal contours. Coarse bilateral pulmonary interstitial markings persist. Ventilation in the left lung is stable since 09/13/2019. No acute osseous abnormality identified. Negative visible bowel gas pattern. IMPRESSION: 1. Right chest tube removed with probable re-accumulation of right lung base pleural effusion, but no pneumothorax identified. 2. Continued coarse bilateral pulmonary interstitial opacity with differential consideration of viral/atypical respiratory infection and edema. Electronically Signed   By: Genevie Ann M.D.   On: 09/21/2019 21:42   DG CHEST PORT 1 VIEW  Result Date: 09/13/2019 CLINICAL DATA:  Shortness of breath EXAM: PORTABLE CHEST 1 VIEW COMPARISON:  Same day radiograph FINDINGS: The cardiomediastinal silhouette is unchanged in contour.Right-sided chest tube. Unchanged small right hydropneumothorax. Mild diffuse interstitial prominence and right basilar heterogeneous opacities, unchanged. Visualized abdomen is unremarkable. Multilevel degenerative changes of the thoracic spine. IMPRESSION: 1.  Unchanged small right hydropneumothorax with right-sided chest tube in place. Electronically Signed   By: Valentino Saxon MD   On: 09/13/2019 13:23   DG CHEST PORT 1 VIEW  Result Date: 09/13/2019 CLINICAL DATA:  Shortness of breath EXAM: PORTABLE CHEST 1 VIEW COMPARISON:  September 12, 2019 FINDINGS: The cardiomediastinal silhouette is unchanged in contour. Small right pleural effusion with Tahj Lindseth flattened appearance likely reflecting Leilynn Pilat small basilar pneumothorax component. Right chest tube in place. No significant left pneumothorax. Persistent interstitial prominence diffusely. Right basilar heterogeneous opacities, likely atelectasis. Visualized abdomen is unremarkable. Dextrocurvature of the thoracic spine with multilevel degenerative changes of the thoracic spine. IMPRESSION: 1. Small right hydropneumothorax with right chest tube in place, not significantly changed. 2.  Mild pulmonary edema with right basilar atelectasis. Electronically Signed   By: Colletta Maryland  Peacock MD   On: 09/13/2019 08:28   DG CHEST PORT 1 VIEW  Result Date: 09/12/2019 CLINICAL DATA:  Empyema, chest tube EXAM: PORTABLE CHEST 1 VIEW COMPARISON:  09/10/2019 FINDINGS: No significant change in AP portable examination. Right-sided chest tube remains in position without significant pneumothorax appreciated. There is unchanged diffuse interstitial pulmonary opacity bilaterally and small, right greater than left pleural effusions. No new airspace opacity. The heart is normal in size. IMPRESSION: 1. No significant change in AP portable examination. Right-sided chest tube remains in position without significant pneumothorax appreciated. 2. There is unchanged diffuse interstitial pulmonary opacity bilaterally and small, right greater than left pleural effusions. 3.  No new airspace opacity. Electronically Signed   By: [name] Candle M.D.   On: 09/12/2019 10:43   DG Chest Port 1 View  Result Date: 09/10/2019 CLINICAL DATA:  65 year old male with  respiratory failure EXAM: PORTABLE CHEST 1 VIEW COMPARISON:  09/09/2019 FINDINGS: Cardiomediastinal silhouette unchanged in size and contour. Similar appearance of mixed interstitial and airspace opacities, with slight improved aeration in the right mid lung. Thoracostomy tube within the right chest terminating towards the apex. Meniscus at the base of the right lung with partial obscuration of the right hemidiaphragm. There is likely Iyari Hagner small ex vacuo pneumothorax. No left-sided pneumothorax or pleural effusion. IMPRESSION: Unchanged right-sided thoracostomy tube with trace residual pleural fluid at the base the right lung and likely Trynity Skousen small ex vacuo pneumothorax. Persisting bilateral right greater than left airspace and interstitial disease, slightly improved. Electronically Signed   By: Corrie Mckusick D.O.   On: 09/10/2019 08:48   DG Chest Port 1 View  Result Date: 09/09/2019 CLINICAL DATA:  Acute respiratory failure, empyema, status post chest tube placement EXAM: PORTABLE CHEST 1 VIEW COMPARISON:  09/08/2019 FINDINGS: Slight interval improvement in consolidation of the right lung, with chest tube remaining in position and no significant pneumothorax. Unchanged underlying diffuse bilateral interstitial pulmonary opacity. The heart and mediastinum are unremarkable. IMPRESSION: 1. Slight interval improvement in consolidation of the right lung, with chest tube remaining in position and no significant pneumothorax. 2. Unchanged underlying diffuse bilateral interstitial pulmonary opacity, consistent with edema and/or multifocal infection. Electronically Signed   By: [name] Candle M.D.   On: 09/09/2019 11:40   DG Chest Port 1 View  Result Date: 09/08/2019 CLINICAL DATA:  Post chest tube placement on the right. Shortness of breath and cough for 1 week. EXAM: PORTABLE CHEST 1 VIEW COMPARISON:  Chest radiograph and CT earlier today. FINDINGS: Placement of right-sided chest tube with decrease in right pleural  effusion from earlier today. Partially loculated lateral and basilar components persist. Patchy opacities throughout the right hemithorax may represent pneumonia, atelectasis, or re-expansion pulmonary edema. Diffuse ground-glass nodular opacities throughout the left lung are similar to imaging earlier today. Stable heart size and mediastinal contours. IMPRESSION: 1. Placement of right-sided chest tube with decreased right pleural effusion from earlier today. Small loculated lateral and basilar components persist. 2. Patchy opacities throughout the right hemithorax may represent pneumonia, atelectasis, or re-expansion pulmonary edema. 3. Stable radiographic appearance of left lung with diffuse ground-glass and nodular opacities. Electronically Signed   By: Keith Rake M.D.   On: 09/08/2019 17:03   DG CHEST PORT 1 VIEW  Result Date: 09/08/2019 CLINICAL DATA:  Status post thoracentesis. EXAM: PORTABLE CHEST 1 VIEW COMPARISON:  September 08, 2019. FINDINGS: Stable cardiomediastinal silhouette. No pneumothorax is noted. Right pleural effusion is significantly smaller status post thoracentesis. Stable right basilar atelectasis or infiltrate is  noted. Stable left lung opacity is noted as well concerning for pneumonia. Bony thorax is unremarkable. IMPRESSION: Right pleural effusion is significantly smaller status post thoracentesis. Stable right basilar atelectasis or infiltrate is noted. Stable left lung opacity is noted concerning for pneumonia. Electronically Signed   By: Marijo Conception M.D.   On: 09/08/2019 11:57   DG Chest Port 1 View  Result Date: 09/08/2019 CLINICAL DATA:  Shortness of breath EXAM: PORTABLE CHEST 1 VIEW COMPARISON:  Jul 12, 2019 FINDINGS: There is Deseree Zemaitis sizable right pleural effusion. There is diffuse interstitial and alveolar opacity bilaterally. Heart size and pulmonary vascularity are within normal limits. There is lower thoracic dextroscoliosis. There is an old healed fracture of the right  posterior seventh rib. There is left carotid artery calcification IMPRESSION: Sizable right pleural effusion. Diffuse interstitial and alveolar opacity bilaterally consistent with either multifocal pneumonia or edema. There may be Dontarius Sheley degree of ARDS as well. More thanone of these entities may be present concurrently. Heart size within normal limits. No adenopathy evident by radiography. There is left carotid artery calcification. Electronically Signed   By: Lowella Grip III M.D.   On: 09/08/2019 09:07    Microbiology: No results found for this or any previous visit (from the past 240 hour(s)).   Labs: Basic Metabolic Panel: Recent Labs  Lab 09/16/19 0503 09/17/19 0701 09/18/19 0553 09/21/19 0959 09/22/19 0446  NA 132* 130* 132* 135 135  K 4.9 4.6 3.7 3.9 3.9  CL 94* 92* 96* 95* 100  CO2 27 29 27 29 26   GLUCOSE 120* 94 98 123* 80  BUN 13 12 12 12 9   CREATININE 0.55* 0.59* 0.55* 0.42* 0.46*  CALCIUM 8.2* 8.1* 8.1* 8.5* 8.5*  MG  --   --   --   --  1.9  PHOS  --   --   --   --  4.4   Liver Function Tests: Recent Labs  Lab 09/21/19 0959 09/22/19 0446  AST 27 20  ALT 17 17  ALKPHOS 67 75  BILITOT 0.2* 0.6  PROT 6.2* 6.3*  ALBUMIN 2.3* 2.5*   No results for input(s): LIPASE, AMYLASE in the last 168 hours. No results for input(s): AMMONIA in the last 168 hours. CBC: Recent Labs  Lab 09/16/19 0503 09/16/19 0503 09/17/19 0701 09/18/19 0553 09/21/19 0959 09/22/19 0446 09/22/19 1411  WBC 23.3*  --  15.4* 14.4* 13.7* 14.2*  --   NEUTROABS  --   --   --   --   --  8.5*  --   HGB 10.1*   < > 8.3* 8.1* 7.8* 7.6* 8.3*  HCT 31.4*   < > 25.4* 26.1* 24.8* 25.2* 26.6*  MCV 83.5  --  83.3 85.6 84.4 87.5  --   PLT 785*  --  683* 758* 738* 722*  --    < > = values in this interval not displayed.   Cardiac Enzymes: No results for input(s): CKTOTAL, CKMB, CKMBINDEX, TROPONINI in the last 168 hours. BNP: BNP (last 3 results) Recent Labs    09/08/19 0851  BNP 237.5*     ProBNP (last 3 results) No results for input(s): PROBNP in the last 8760 hours.  CBG: No results for input(s): GLUCAP in the last 168 hours.     Signed:  Fayrene Helper MD.  [facility] 09/22/2019, 5:01 PM

## 2019-09-22 NOTE — Progress Notes (Signed)
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  Admitted for acute hypoxic respiratory failure secondary to pneumonia, empyema.  Patient now s/p right chest tube on 09/15/19.    PT Comments    Patient is progressing steadily and continues to advance gait distance and increase activity. He is mod independent with RW for functional transfers and mobilizing at supervision level for gait with RW. He is requiring 1L/min per RN to maintain sats >90% with mobility and this session SpO2 remained at 98% during gait. Patient's HR continues to range between 100-140bpm with activity. Patient educated on functional exercises (Sit<>Stand) and how to set up at home to strengthen LE's. Pt requires single UE use for power up. He is now mobilizing at safe level to ambulate with RN/NT staff and no longer requires acute PT services. He will require RW for safety with mobility to discharge home when medically ready. Will sign off.   Follow Up Recommendations  No PT follow up     Equipment Recommendations  Rolling walker with 5" wheels    Recommendations for Other Services       Precautions / Restrictions Precautions Precautions: Fall Precaution Comments: pt reports his last fall was in December (states he was drinking back then and quit drinking alcohol since then) Restrictions Weight Bearing Restrictions: No    Mobility  Bed Mobility Overal bed mobility: Modified Independent             General bed mobility comments: pt OOB in recliner  Transfers Overall transfer level: Modified independent Equipment used: Rolling walker (2 wheeled) Transfers: Sit to/from Stand Sit to Stand: Modified independent (Device/Increase time)          General transfer comment: no assist required, pt stedy and supervision/mod I for sit<>stand from recliner.  Ambulation/Gait Ambulation/Gait assistance: Supervision Gait Distance (Feet): 160 Feet Assistive device: Rolling walker (2 wheeled) Gait Pattern/deviations: Step-through pattern;Decreased step length - right;Decreased step length - left;Decreased stride length Gait velocity: decr   General Gait Details: pt steady this date with RW and no overt LOB. Pt on 1L/mina nd Sats maintained at 98%. Has initiated ambulating with RN staff for increased activity and to assess O2 needs.    Stairs             Wheelchair Mobility    Modified Rankin (Stroke Patients Only)       Balance Overall balance assessment: Needs assistance Sitting-balance support: Feet supported Sitting balance-Leahy Scale: Good     Standing balance support: During functional activity;Bilateral upper extremity supported Standing balance-Leahy Scale: Fair           Cognition Arousal/Alertness: Awake/alert Behavior During Therapy: WFL for tasks assessed/performed Overall Cognitive Status: Within Functional Limits for tasks assessed           Exercises Other Exercises Other Exercises: 1x10 reps sit<>stand with use of single UE for power up.    General Comments        Pertinent Vitals/Pain Pain Assessment: No/denies pain    Home Living   Prior Function    PT Goals (current goals can now be found in the care plan section) Acute Rehab PT Goals Patient Stated Goal: get back home PT Goal Formulation: With patient Time For Goal Achievement: 09/30/19 Progress towards PT goals:  Progressing toward goals    Frequency    Min 3X/week      PT Plan Frequency needs to be updated (dc to RN staff for mobility)    Co-evaluation            AM-PAC PT "6 Clicks" Mobility   Outcome Measure  Help needed turning from your back to your side while in a flat bed without using bedrails?:  None Help needed moving from lying on your back to sitting on the side of a flat bed without using bedrails?: None Help needed moving to and from a bed to a chair (including a wheelchair)?: None Help needed standing up from a chair using your arms (e.g., wheelchair or bedside chair)?: None Help needed to walk in hospital room?: A Little Help needed climbing 3-5 steps with a railing? : A Little 6 Click Score: 22    End of Session Equipment Utilized During Treatment: Oxygen Activity Tolerance: Patient tolerated treatment well Patient left: in chair;with call bell/phone within reach;with chair alarm set Nurse Communication: Mobility status PT Visit Diagnosis: Unsteadiness on feet (R26.81);Muscle weakness (generalized) (M62.81);Difficulty in walking, not elsewhere classified (R26.2)     Time: 1210-1229 PT Time Calculation (min) (ACUTE ONLY): 19 min  Charges:  $Therapeutic Exercise: 8-22 mins                     Verner Mould, DPT Acute Rehabilitation Services  Office [phone] Pager [phone]  09/22/2019 2:18 PM

## 2019-09-22 NOTE — Progress Notes (Signed)
Chest tube removed 7/28, follow up CXR with residual small right basilar effusion, no pneumothorax on imaging review.  Continue antibiotics, D15/x.    Plan: -Outpatient pulmonary follow up arranged, will need follow up CT imaging to ensure clearance and no underlying mass  -Consider 4-6 weeks total abx in setting of empyema (would need two more weeks) -follow QTc parrwith Levaquin administration -will need dental follow up post discharge  -encourage pulmonary hygiene    PCCM will be available PRN.  Please call back sooner if new needs arise.    Noe Gens, MSN, NP-C [facility] 09/22/2019, 11:25 AM   Please see [email or web address] for pager details.

## 2019-09-22 NOTE — TOC Transition Note (Signed)
Transition of Care [facility]) - CM/SW Discharge Note   Patient Details  Name: Edwin Torres MRN: [id number] Date of Birth: [birth year]  [facility]) CM/SW Contact:  Lennart Pall, LCSW Phone Number: 09/22/2019, 3:27 PM   Clinical Narrative:   Alerted by RN of O2 order placed and pt to d/c today.  Met with pt and no agency preference.  Order placed with [facility].  Portable tank to be delivered to room prior to d/c and concentrator to home.  No further TOC needs.    Final next level of care: Home/Self Care Barriers to Discharge: Barriers Resolved   Patient Goals and CMS Choice Patient states their goals for this hospitalization and ongoing recovery are:: to go home CMS Medicare.gov Compare Post Acute Care list provided to:: Patient    Discharge Placement                       Discharge Plan and Services In-house Referral: Clinical Social Work Discharge Planning Services: CM Consult            DME Arranged: Oxygen DME Agency: [employer] Date DME Agency Contacted: 09/22/19 Time DME Agency Contacted: 1527 Representative spoke with at DME Agency: [place]: NA [place] Agency: NA        Social Determinants of Health ([place]) Interventions     Readmission Risk Interventions Readmission Risk Prevention Plan 09/20/2019  Post Dischage Appt Complete  Medication Screening Complete  Transportation Screening Complete  Some recent data might be hidden

## 2019-09-22 NOTE — Progress Notes (Signed)
eLink Physician-Brief Progress Note Patient Name: Edwin Torres DOB: December 08, 1955 MRN: [id number]   Date of Service  09/22/2019  HPI/Events of Note  Hand off to do: Check CxR post chest tube out for empyema.  IMPRESSION: 1. Right chest tube removed with probable re-accumulation of right lung base pleural effusion, but no pneumothorax identified. 2. Continued coarse bilateral pulmonary interstitial opacity with differential consideration of viral/atypical respiratory infection and edema.  Film seen by myself.   eICU Interventions  - continue care.      Intervention Category Intermediate Interventions: Diagnostic test evaluation  Elmer Sow 09/22/2019, 12:42 AM

## 2019-09-22 NOTE — Progress Notes (Signed)
Pharmacy Antibiotic Note  Edwin Torres is a 64 y.o. male admitted on 09/08/2019 with pneumonia, pleural effusion s/p chest tube with TPA and dornase alpha instillation.  Pharmacy has been consulted for levaquin dosing; note history of anaphylaxis with PCN that required hospitalization.  Day #15 abx - QTc monitored for levofloxacin dosing as elevated on admission. QTc 489 msec on tele on 7/21 - WBC 14.2 - elevated but stable - Afebrile - SCr stable/WNL - Right chest tube removed yesterday CXR: probable re-accumulation of right lung base pleural effusion, but no pneumothorax identified.  Plan: Continue Levaquin 750mg  PO q24h Follow up renal function & cultures   Height: 5\' 5"  (165.1 cm) Weight: 54.8 kg (120 lb 13 oz) IBW/kg (Calculated) : 61.5  Temp (24hrs), Avg:98.4 F (36.9 C), Min:98.1 F (36.7 C), Max:98.9 F (37.2 C)  Recent Labs  Lab 09/16/19 0503 09/17/19 0701 09/18/19 0553 09/21/19 0959 09/22/19 0446  WBC 23.3* 15.4* 14.4* 13.7* 14.2*  CREATININE 0.55* 0.59* 0.55* 0.42* 0.46*    Estimated Creatinine Clearance: 71.4 mL/min (A) (by C-G formula based on SCr of 0.46 mg/dL (L)).    Allergies  Allergen Reactions  . Penicillins Anaphylaxis    Has patient had a PCN reaction causing immediate rash, facial/tongue/throat swelling, SOB or lightheadedness with hypotension: Yes Has patient had a PCN reaction causing severe rash involving mucus membranes or skin necrosis: No Has patient had a PCN reaction that required hospitalization: Yes Has patient had a PCN reaction occurring within the last 10 years: No If all of the above answers are "NO", then may proceed with Cephalosporin use.   . Ativan [Lorazepam] Other (See Comments)    Made him crazy    Antimicrobials this admission:  7/15 Vancomycin  >> 7/16  7/15 Levaquin >>  Dose adjustments this admission:   Microbiology results:  7/15 MRSA PCR: neg 7/15 BCx: NGF 7/15 Pleural fluid: few Strep intermedius, LVQ  sens 7/15 Pleural fungal: no fungus observed 7/15 UCx:ng-final  Thank you for allowing pharmacy to be a part of this patient's care. Eudelia Bunch, Pharm.D 09/22/2019 10:00 AM Pharmacy: [phone] 09/22/2019 9:56 AM

## 2019-09-22 NOTE — Plan of Care (Signed)
Instructions were reviewed with patient. All questions were answered. Patient will d/c with portable O2. Patient was transported to main entrance by wheelchair.

## 2019-09-22 NOTE — Progress Notes (Addendum)
Patient assessed for home O2 needs. While laying in bed patient was sating at 94% with 1/L. Once removed after about 5 minutes patients sats dropped to 87%. Once ambulating, O2 was removed and sats dropped to 83-86%. O2 was placed on him while he was walking at 2/L and sats increased to 90%. In attempt to have a higher sat RN increased O2 to 3/L and got 93% while ambulating. Once going back to the room patient was placed on 1/L and is sating at 96%. Patient did not c/o SOB while ambulating. While walking patient HR increased to 130-140's and returned to 104 once sitting. EKG done and is in chart. Phone conversation with MD Florene Glen followed this.

## 2019-09-23 ENCOUNTER — Telehealth: Payer: Self-pay

## 2019-09-23 NOTE — Telephone Encounter (Cosign Needed)
Transition Care Management Follow-up Telephone Call  Date of discharge and from where: 09/22/19, [facility]  How have you been since you were released from the hospital? [name] sleeping at this time  Any questions or concerns? No   Items Reviewed:  Did the pt receive and understand the discharge instructions provided? Yes   Medications obtained and verified? Yes   Any new allergies since your discharge? No   Dietary orders reviewed? Yes  Do you have support at home? Yes   Functional Questionnaire: (I = Independent and D = Dependent) Independent with help and assistance with Edwin Torres ADLs: I  Bathing/Dressing- I  Meal Prep- I  Eating- I  Maintaining continence- I  Transferring/Ambulation- I  Managing Meds- I  Follow up appointments reviewed:   PCP Hospital f/u appt confirmed? Yes  Scheduled to see Edwin Torres on 10/03/19 @ 1:00p.m.  [facility] f/u appt confirmed? Yes  Scheduled to see Pulmonologist on 10/06/19 @ 11:30 a.m.  Are transportation arrangements needed? No   If their condition worsens, is the pt aware to call PCP or go to the Emergency Dept.? Yes  Was the patient provided with contact information for the PCP's office or ED? Yes  Was to pt encouraged to call back with questions or concerns? Yes

## 2019-09-26 ENCOUNTER — Other Ambulatory Visit: Payer: Self-pay

## 2019-09-26 DIAGNOSIS — R609 Edema, unspecified: Secondary | ICD-10-CM

## 2019-09-26 NOTE — Telephone Encounter (Signed)
..   LAST APPOINTMENT DATE: 09/23/2019   NEXT APPOINTMENT DATE:@8 /10/2019  MEDICATION:furosemide (LASIX) 20 MG tablet    PHARMACY:  Pt was put on this medication in the hospital and takes his last dose tomorrow. Pt.'s wife wants to know if he needs more.

## 2019-09-26 NOTE — Telephone Encounter (Signed)
Ok to refill Lasix? Please advise

## 2019-09-27 NOTE — Telephone Encounter (Signed)
I think we can hold this for now.  If breathing worsens or if patient develops signs of excess fluid accumulation in legs, please let us know.

## 2019-09-28 NOTE — Telephone Encounter (Signed)
Pt's wife notified.

## 2019-10-03 ENCOUNTER — Other Ambulatory Visit: Payer: Self-pay

## 2019-10-03 ENCOUNTER — Encounter: Payer: Self-pay | Admitting: Physician Assistant

## 2019-10-03 ENCOUNTER — Ambulatory Visit (INDEPENDENT_AMBULATORY_CARE_PROVIDER_SITE_OTHER): Payer: PPO | Admitting: Physician Assistant

## 2019-10-03 VITALS — BP 122/68 | HR 96 | Temp 98.6°F | Resp 18 | Ht 65.0 in | Wt 124.0 lb

## 2019-10-03 DIAGNOSIS — J9601 Acute respiratory failure with hypoxia: Secondary | ICD-10-CM | POA: Diagnosis not present

## 2019-10-03 DIAGNOSIS — J189 Pneumonia, unspecified organism: Secondary | ICD-10-CM

## 2019-10-03 DIAGNOSIS — F17200 Nicotine dependence, unspecified, uncomplicated: Secondary | ICD-10-CM

## 2019-10-03 DIAGNOSIS — D509 Iron deficiency anemia, unspecified: Secondary | ICD-10-CM | POA: Diagnosis not present

## 2019-10-03 DIAGNOSIS — Z91199 Patient's noncompliance with other medical treatment and regimen due to unspecified reason: Secondary | ICD-10-CM

## 2019-10-03 DIAGNOSIS — Z5329 Procedure and treatment not carried out because of patient's decision for other reasons: Secondary | ICD-10-CM | POA: Diagnosis not present

## 2019-10-03 DIAGNOSIS — E441 Mild protein-calorie malnutrition: Secondary | ICD-10-CM | POA: Diagnosis not present

## 2019-10-03 DIAGNOSIS — J869 Pyothorax without fistula: Secondary | ICD-10-CM | POA: Diagnosis not present

## 2019-10-03 DIAGNOSIS — R0602 Shortness of breath: Secondary | ICD-10-CM | POA: Diagnosis not present

## 2019-10-03 NOTE — Patient Instructions (Signed)
It was great to see you!  We will be in touch with your lab results.  Please follow-up with pulmonology as you are instructed.  Let's follow-up in 3 months, sooner if you have concerns.  Take care,  Inda Coke PA-C

## 2019-10-03 NOTE — Progress Notes (Signed)
Chief Complaint:  Edwin Torres is a 64 yo male who presents today for a TCM visit.  Assessment/Plan:  New/Acute Problems: None  Chronic Problems Addressed Today: SOB; CAP; empyema -- He denies any issues. I encouraged him to buy a pulse ox to monitor his oxygen status. Offered xray today, but he would like to have this done at pulmonary when he goes on 10/06/19. I also recommended that we order outpatient cardiac u/s and/or cardiology referral but he declined today. Update CBC and CMP today. Complete levaquin as previously instructed.  Anemia -- he is tolerating oral iron. He does not want any work-up for this.  Malnutrition -- appetite is slowly improving. Continue oral nutrition supplements as able. Follow-up in 3 months.  Smoker -- continue to work towards cessation. He is in pre-contemplative phase.  Patient has a moderate level of medical decision making.        Subjective:  HPI: Patient presents today for hospital follow-up.  Summary of Hospital admission: Reason for admission: acute hypoxic respiratory failure secondary to PNA and empyema Date of admission: 09/08/19 Date of discharge: 09/22/19 Date of Interactive contact: TCM call was 09/23/19 Summary of Hospital course: Patient was admitted for acute hypoxic respiratory failure secondary to strep community acquired PNA and empyema; he met criteria for sepsis during this admission. Chest tube placed 09/08/19 and eventually removed 09/21/19. He was discharged on nasal cannula oxygen, 2 liters. He was treated with levaquin daily in the hospital, given oral levaquin to complete for an additional two weeks.  Interim history outlined by problem:   SOB; CAP; empyema -- He denies SOB. He was instructed to continue [place] oxygen at 2 liters. He discontinued this himself about two days ago. He does not have a pulse ox. He is seeing pulmonary on 10/06/19. Denies chest pain or swelling in LE.  Anemia -- started on oral iron in the  hospital, overall feels ok but has some constipation. Denies unusual fatigue, lightheadedness, rectal bleeding.  Malnutrition -- he feels like he is not eating well as he should be quite yet. Wife has given him glucerna, he seems to tolerate this ok.   Tobacco abuse -- was smoking a little under a pack prior to hospitalization. Now he is smoking about 5-6 cigarettes daily.  ROS: Negative unless otherwise specified per HPI.  PMH:  The following were reviewed and entered/updated in epic: Past Medical History:  Diagnosis Date  . Alcohol addiction ([place])   . Allergy   . Cancer ([place])    Melanoma on face was removed  . Depression   . Herpes genitalis in men   . History of chicken pox   . Seizures [facility])    Patient Active Problem List   Diagnosis Date Noted  . Chronic respiratory insufficiency 09/22/2019  . Sepsis ([place]) 09/14/2019  . Malnutrition of moderate degree 09/14/2019  . CAP (community acquired pneumonia) 09/11/2019  . Acute hypoxemic respiratory failure ([place]) 09/08/2019  . Chest tube in place   . Empyema ([place])   . Patient non-compliant, refuses any preventative care measures 04/04/2019  . Hypomagnesemia 03/10/2019  . Dysphagia 03/04/2019  . Moderate Neurocognitive deficits 03/04/2019  . Hypokalemia 02/24/2019  . Somnolence, daytime 02/24/2019  . Alcohol abuse with intoxication ([place]) 02/22/2019  . Frequent falls 02/22/2019  . Macrocytic anemia 02/22/2019  . Tobacco abuse 02/22/2019  . Physical deconditioning 02/22/2019  . Thrombocytopenia ([place]) 02/22/2019  . Weakness of right third cranial nerve 03/21/2016   Past Surgical History:  Procedure Laterality Date  . FOOT FRACTURE SURGERY Bilateral   . NOSE SURGERY    . VASECTOMY     Family History  Problem Relation Age of Onset  . Cancer Mother        bladder  . Diabetes Mother   . Arthritis Mother   . Hyperlipidemia Father   . Alcohol abuse Father   . Arthritis Sister     Medications- Reconciled discharge and  current medications in Epic.  Current Outpatient Medications  Medication Sig Dispense Refill  . feeding supplement, ENSURE ENLIVE, (ENSURE ENLIVE) LIQD Take 237 mLs by mouth 2 (two) times daily between meals.    . ferrous sulfate 325 (65 FE) MG tablet Take 1 tablet (325 mg total) by mouth daily with breakfast. 30 tablet 0  . gabapentin (NEURONTIN) 100 MG capsule Take 1 capsule at bedtime. May increase as tolerated by 100 mg to a maximum of 300 mg nightly. (Patient taking differently: Take 100 mg by mouth as needed. Take 1 capsule at bedtime. May increase as tolerated by 100 mg to a maximum of 300 mg nightly.) 90 capsule 1  . ibuprofen (ADVIL) 200 MG tablet Take 600 mg by mouth every 6 (six) hours as needed for headache or moderate pain.     Marland Kitchen levofloxacin (LEVAQUIN) 750 MG tablet Take 1 tablet (750 mg total) by mouth daily for 14 days. 14 tablet 0  . furosemide (LASIX) 20 MG tablet Take 1 tablet (20 mg total) by mouth daily for 5 days. Please follow up with your PCP for repeat labs and CXR once this is complete 5 tablet 0   No current facility-administered medications for this visit.     Allergies-reviewed and updated Allergies  Allergen Reactions  . Penicillins Anaphylaxis    Has patient had a PCN reaction causing immediate rash, facial/tongue/throat swelling, SOB or lightheadedness with hypotension: Yes Has patient had a PCN reaction causing severe rash involving mucus membranes or skin necrosis: No Has patient had a PCN reaction that required hospitalization: Yes Has patient had a PCN reaction occurring within the last 10 years: No If all of the above answers are "NO", then may proceed with Cephalosporin use.   . Ativan [Lorazepam] Other (See Comments)    Made him crazy    Social History   Socioeconomic History  . Marital status: Married    Spouse name: Not on file  . Number of children: Not on file  . Years of education: Not on file  . Highest education level: Not on file    Occupational History  . Not on file  Tobacco Use  . Smoking status: Current Every Day Smoker    Packs/day: 1.00  . Smokeless tobacco: Current User  Substance and Sexual Activity  . Alcohol use: Not Currently  . Drug use: No  . Sexual activity: Yes    Partners: Female    Birth control/protection: None  Other Topics Concern  . Not on file  Social History Narrative   2012 fell from roof, disabled since   Social Determinants of Health   Financial Resource Strain:   . Difficulty of Paying Living Expenses:   Food Insecurity:   . Worried About Charity fundraiser in the Last Year:   . Arboriculturist in the Last Year:   Transportation Needs:   . Film/video editor (Medical):   Marland Kitchen Lack of Transportation (Non-Medical):   Physical Activity:   . Days of Exercise per Week:   .  Minutes of Exercise per Session:   Stress:   . Feeling of Stress :   Social Connections:   . Frequency of Communication with Friends and Family:   . Frequency of Social Gatherings with Friends and Family:   . Attends Religious Services:   . Active Member of Clubs or Organizations:   . Attends Archivist Meetings:   Marland Kitchen Marital Status:           Objective:  Physical Exam: Vitals:   10/03/19 1257  BP: 122/68  Pulse: 96  Resp: 18  Temp: 98.6 F (37 C)  SpO2: 97%     Gen: NAD, resting comfortably CV: RRR with no murmurs appreciated Pulm: NWOB, R lower lung with coarse sounds; no wheezes, or rhonchi GI: Normal bowel sounds present. Soft, Nontender, Nondistended. MSK: No edema, cyanosis, or clubbing noted Skin: Warm, dry Neuro: Grossly normal, moves all extremities Psych: Normal affect and thought content  Summary/Review of work up during hospitalization: CT on 09/14/19 chest: IMPRESSION: 1. Small right hydropneumothorax with basilar air component. Multiple bubbles of air within the dependent most portion of the pleural effusion may be related to pneumothorax, however a  superimposed infection of the pleural fluid/empyema not excluded. 2. Diffuse ground-glass attenuation throughout both lung fields most suggestive of pulmonary edema. 3. The patchy and nodular opacities seen on previous CT have nearly resolved. 4. Mildly prominent mediastinal lymph nodes, likely reactive.  DG xray 09/21/19 chest: IMPRESSION: 1. Right chest tube removed with probable re-accumulation of right lung base pleural effusion, but no pneumothorax identified.  2. Continued coarse bilateral pulmonary interstitial opacity with differential consideration of viral/atypical respiratory infection and edema.  Labs reviewed with patient and wife.  Inda Coke

## 2019-10-04 LAB — CBC WITH DIFFERENTIAL/PLATELET
Absolute Monocytes: 1227 cells/uL — ABNORMAL HIGH (ref 200–950)
Basophils Absolute: 62 cells/uL (ref 0–200)
Basophils Relative: 0.6 %
Eosinophils Absolute: 322 cells/uL (ref 15–500)
Eosinophils Relative: 3.1 %
HCT: 28.7 % — ABNORMAL LOW (ref 38.5–50.0)
Hemoglobin: 8.8 g/dL — ABNORMAL LOW (ref 13.2–17.1)
Lymphs Abs: 3463 cells/uL (ref 850–3900)
MCH: 25.7 pg — ABNORMAL LOW (ref 27.0–33.0)
MCHC: 30.7 g/dL — ABNORMAL LOW (ref 32.0–36.0)
MCV: 83.7 fL (ref 80.0–100.0)
MPV: 8.1 fL (ref 7.5–12.5)
Monocytes Relative: 11.8 %
Neutro Abs: 5325 cells/uL (ref 1500–7800)
Neutrophils Relative %: 51.2 %
Platelets: 788 10*3/uL — ABNORMAL HIGH (ref 140–400)
RBC: 3.43 10*6/uL — ABNORMAL LOW (ref 4.20–5.80)
RDW: 15.7 % — ABNORMAL HIGH (ref 11.0–15.0)
Total Lymphocyte: 33.3 %
WBC: 10.4 10*3/uL (ref 3.8–10.8)

## 2019-10-04 LAB — COMPREHENSIVE METABOLIC PANEL
AG Ratio: 1 (calc) (ref 1.0–2.5)
ALT: 10 U/L (ref 9–46)
AST: 12 U/L (ref 10–35)
Albumin: 3.2 g/dL — ABNORMAL LOW (ref 3.6–5.1)
Alkaline phosphatase (APISO): 107 U/L (ref 35–144)
BUN/Creatinine Ratio: 10 (calc) (ref 6–22)
BUN: 5 mg/dL — ABNORMAL LOW (ref 7–25)
CO2: 24 mmol/L (ref 20–32)
Calcium: 9 mg/dL (ref 8.6–10.3)
Chloride: 104 mmol/L (ref 98–110)
Creat: 0.49 mg/dL — ABNORMAL LOW (ref 0.70–1.25)
Globulin: 3.1 g/dL (calc) (ref 1.9–3.7)
Glucose, Bld: 82 mg/dL (ref 65–99)
Potassium: 4.3 mmol/L (ref 3.5–5.3)
Sodium: 138 mmol/L (ref 135–146)
Total Bilirubin: 0.3 mg/dL (ref 0.2–1.2)
Total Protein: 6.3 g/dL (ref 6.1–8.1)

## 2019-10-06 ENCOUNTER — Ambulatory Visit (INDEPENDENT_AMBULATORY_CARE_PROVIDER_SITE_OTHER): Payer: PPO

## 2019-10-06 ENCOUNTER — Other Ambulatory Visit: Payer: Self-pay

## 2019-10-06 ENCOUNTER — Encounter: Payer: Self-pay | Admitting: Adult Health

## 2019-10-06 ENCOUNTER — Ambulatory Visit (INDEPENDENT_AMBULATORY_CARE_PROVIDER_SITE_OTHER): Payer: PPO | Admitting: Adult Health

## 2019-10-06 VITALS — BP 120/86 | HR 104 | Ht 64.0 in | Wt 123.0 lb

## 2019-10-06 DIAGNOSIS — J869 Pyothorax without fistula: Secondary | ICD-10-CM | POA: Diagnosis not present

## 2019-10-06 DIAGNOSIS — J811 Chronic pulmonary edema: Secondary | ICD-10-CM | POA: Diagnosis not present

## 2019-10-06 DIAGNOSIS — Z720 Tobacco use: Secondary | ICD-10-CM | POA: Diagnosis not present

## 2019-10-06 DIAGNOSIS — J90 Pleural effusion, not elsewhere classified: Secondary | ICD-10-CM | POA: Diagnosis not present

## 2019-10-06 DIAGNOSIS — R0689 Other abnormalities of breathing: Secondary | ICD-10-CM

## 2019-10-06 NOTE — Assessment & Plan Note (Signed)
O2 saturations on room air walking are adequate and above 90%.  Advised to use O2 saturations to keep O2 saturation greater than 88 to 90%.  Will reevaluate on return if oxygen is still needed

## 2019-10-06 NOTE — Progress Notes (Signed)
@Patient  ID: Edwin Torres, male    DOB: 11/29/1955, 64 y.o.   MRN: [id number]  Chief Complaint  Patient presents with  . Follow-up    Empyema     Referring provider: Inda Coke, [place]  HPI: 64 year old male active smoker seen for pulmonary consult for pneumonia and empyema requiring chest tube during hospitalization July 2021 Medical history significant for alcohol abuse, seizure disorder, poor dentition, right eye ptosis secondary to 3rd nerve palsy  TEST/EVENTS :  Pleural fluid from 7/15  Exudative with glucsoe <20   And wbc 97170 with 95% Polys Pleural fluid culture 7/15 > strep intermedius > sensitive to levaquin  Thoracentesis 09/08/2019 significant for 250 cc of cloudy fluid  10/06/2019 Follow up : PNA , Empyema  Patient returns for a post hospital follow-up.  Patient was seen for pulmonary consult during hospitalization last month for pneumonia and empyema.  CT chest showed a large right complex pleural effusion suggesting hemorrhagic or proteinaceous components, extensive asymmetrical pulmonary infiltrate and peribronchial nodularity. Patient was treated with aggressive IV antibiotics.  Pulmonary consult was required and a chest tube with drainage was placed.  He did require TPA.  Pleural fluid showed strep intermedius.  He was treated with additional 14 days of Levaquin.  He did have some respiratory distress.  And required oxygen.  He did require oxygen at discharge 2 L/minute.  Patient did have poor dentition has been recommended for outpatient dental follow-up. Since discharge patient says he is feeling a lot better.  Appetite is starting to return.  Breathing is improved.  His activity level is slowly starting to return.  Patient has not drink any alcohol since discharge.  Has not followed up with a dentist but his wife and him state they are made planning on having a follow-up visit. Walk test in the office shows no desaturations on room air.  Patient has 2 days of Levaquin  left. Denies any chest pain orthopnea PND or hemoptysis.   Allergies  Allergen Reactions  . Penicillins Anaphylaxis    Has patient had a PCN reaction causing immediate rash, facial/tongue/throat swelling, SOB or lightheadedness with hypotension: Yes Has patient had a PCN reaction causing severe rash involving mucus membranes or skin necrosis: No Has patient had a PCN reaction that required hospitalization: Yes Has patient had a PCN reaction occurring within the last 10 years: No If all of the above answers are "NO", then may proceed with Cephalosporin use.   . Ativan [Lorazepam] Other (See Comments)    Made him crazy    Immunization History  Administered Date(s) Administered  . Pneumococcal Conjugate-13 04/04/2019  . Tdap 07/11/2016, 02/22/2019    Past Medical History:  Diagnosis Date  . Alcohol addiction ([place])   . Allergy   . Cancer ([place])    Melanoma on face was removed  . Depression   . Herpes genitalis in men   . History of chicken pox   . Seizures ([place])     Tobacco History: Social History   Tobacco Use  Smoking Status Current Every Day Smoker  . Packs/day: 1.00  Smokeless Tobacco Current User   Ready to quit: No Counseling given: Yes   Outpatient Medications Prior to Visit  Medication Sig Dispense Refill  . feeding supplement, ENSURE ENLIVE, (ENSURE ENLIVE) LIQD Take 237 mLs by mouth 2 (two) times daily between meals.    . ferrous sulfate 325 (65 FE) MG tablet Take 1 tablet (325 mg total) by mouth daily with breakfast. 30 tablet  0  . gabapentin (NEURONTIN) 100 MG capsule Take 1 capsule at bedtime. May increase as tolerated by 100 mg to a maximum of 300 mg nightly. (Patient taking differently: Take 100 mg by mouth as needed. Take 1 capsule at bedtime. May increase as tolerated by 100 mg to a maximum of 300 mg nightly.) 90 capsule 1  . ibuprofen (ADVIL) 200 MG tablet Take 600 mg by mouth every 6 (six) hours as needed for headache or moderate pain.     Marland Kitchen  levofloxacin (LEVAQUIN) 750 MG tablet Take 1 tablet (750 mg total) by mouth daily for 14 days. 14 tablet 0  . furosemide (LASIX) 20 MG tablet Take 1 tablet (20 mg total) by mouth daily for 5 days. Please follow up with your PCP for repeat labs and CXR once this is complete 5 tablet 0   No facility-administered medications prior to visit.     Review of Systems:   Constitutional:   No  weight loss, night sweats,  Fevers, chills, + fatigue, or  lassitude.  HEENT:   No headaches,  Difficulty swallowing,  Tooth/dental problems, or  Sore throat,                No sneezing, itching, ear ache, nasal congestion, post nasal drip,   CV:  No chest pain,  Orthopnea, PND, swelling in lower extremities, anasarca, dizziness, palpitations, syncope.   GI  No heartburn, indigestion, abdominal pain, nausea, vomiting, diarrhea, change in bowel habits, loss of appetite, bloody stools.   Resp:  .  No chest wall deformity  Skin: no rash or lesions.  GU: no dysuria, change in color of urine, no urgency or frequency.  No flank pain, no hematuria   MS:  No joint pain or swelling.  No decreased range of motion.  No back pain.    Physical Exam  BP 120/86 (BP Location: Left Arm, Cuff Size: Normal)   Pulse (!) 104   Ht 5\' 4"  (1.626 m)   Wt 123 lb (55.8 kg)   SpO2 95%   BMI 21.11 kg/m   GEN: A/Ox3; pleasant , NAD, thin    HEENT:  [place]/AT,    NOSE-clear, THROAT-clear, no lesions, no postnasal drip or exudate noted.  Poor dentition   NECK:  Supple w/ fair ROM; no JVD; normal carotid impulses w/o bruits; no thyromegaly or nodules palpated; no lymphadenopathy.    RESP  Clear  P & A; w/o, wheezes/ rales/ or rhonchi. no accessory muscle use, no dullness to percussion  CARD:  RRR, no m/r/g, no peripheral edema, pulses intact, no cyanosis or clubbing.  GI:   Soft & nt; nml bowel sounds; no organomegaly or masses detected.   Musco: Warm bil, no deformities or joint swelling noted.   Neuro: alert, no focal  deficits noted.    Skin: Warm, no lesions or rashes    Lab Results:  CBC   BMET  BNP  ProBNP No results found for: PROBNP  Imaging: CT CHEST WO CONTRAST  Result Date: 09/14/2019 CLINICAL DATA:  Shortness of breath, respiratory failure, hydropneumothorax EXAM: CT CHEST WITHOUT CONTRAST TECHNIQUE: Multidetector CT imaging of the chest was performed following the standard protocol without IV contrast. COMPARISON:  Chest x-ray 09/13/2019, CT 09/08/2019 FINDINGS: Cardiovascular: Heart size is normal. No pericardial effusion. Thoracic aorta is nonaneurysmal. Coronary artery calcification. Pulmonary trunk is nondilated. Mediastinum/Nodes: Mildly prominent mediastinal lymph nodes again including paratracheal, AP window, and subcarinal locations. Evaluation of the hilar structures is limited in the absence  of intravenous contrast. No axillary adenopathy. Trachea and thyroid gland within normal limits. Esophagus is unremarkable. Lungs/Pleura: Right-sided chest tube in place with distal tip terminating the posterior aspect of the right upper hemithorax. Small right hydropneumothorax with basilar air component. Multiple bubbles of air within the dependent most portion of the pleural effusion may be related to pneumothorax. A superimposed infection of the pleural fluid/empyema not excluded. Pleural effusion has significantly decreased in size compared to prior CT. Evaluation is limited in the absence of IV contrast. Diffuse ground-glass opacities throughout both lungs. The patchy and nodular opacities seen on previous CT have nearly resolved. There is a few remaining areas of nodular airspace opacity including at the right lung apex (series 7, image 34). No left-sided pleural effusion or pneumothorax. Upper Abdomen: No acute abnormality. Musculoskeletal: Remote right seventh rib fracture. Scoliotic thoracic curvature, convex to the right. Similar degree of multilevel spondylosis within the thoracic spine and  visualized portions of the cervical and lumbar spines. No acute osseous findings. IMPRESSION: 1. Small right hydropneumothorax with basilar air component. Multiple bubbles of air within the dependent most portion of the pleural effusion may be related to pneumothorax, however a superimposed infection of the pleural fluid/empyema not excluded. 2. Diffuse ground-glass attenuation throughout both lung fields most suggestive of pulmonary edema. 3. The patchy and nodular opacities seen on previous CT have nearly resolved. 4. Mildly prominent mediastinal lymph nodes, likely reactive. Electronically Signed   By: Davina Poke D.O.   On: 09/14/2019 15:14   CT CHEST W CONTRAST  Result Date: 09/08/2019 CLINICAL DATA:  Pleural effusion, respiratory distress, cough, dyspnea EXAM: CT CHEST WITH CONTRAST TECHNIQUE: Multidetector CT imaging of the chest was performed during intravenous contrast administration. CONTRAST:  83mL OMNIPAQUE IOHEXOL 300 MG/ML  SOLN COMPARISON:  None. Findings are correlated with chest radiographs of 09/08/2019 and 07/12/2019 FINDINGS: Cardiovascular: Moderate coronary artery calcification within the left anterior descending coronary artery. Global cardiac size within normal limits. No pericardial effusion. Thoracic vasculature is unremarkable. Mediastinum/Nodes: There is shotty bilateral hilar, aortopulmonary, and subcarinal adenopathy without frank pathologic thoracic adenopathy identified. These may be reactive in nature. Visualized thyroid gland is unremarkable. There is mild gas is distension of the esophagus which is nonspecific. The esophagus is otherwise unremarkable. Lungs/Pleura: Large, partially loculated, complex right pleural effusion is present demonstrating pleural thickening and enhancement, best noted at the right lung base, particularly involving the diaphragmatic parietal pleura. This suggests a complex etiology with hemorrhagic or proteinaceous components. Super infection would  be difficult to exclude. There is resultant compressive atelectasis of the right lung base with subtotal collapse of the right middle and lower lobes. There is superimposed extensive ground-glass pulmonary infiltrate throughout the left lung with a more patchy distribution within the right upper lobe and superimposed peribronchovascular and subpleural nodularity suspicious for atypical infection in the acute setting. If chronic, these findings can be seen in the setting of lymphoproliferative disorders, leukemia, or granulomatous inflammation such as sarcoidosis. There is superimposed smooth interlobular septal thickening noted within the left lung most in keeping with superimposed asymmetric pulmonary edema. There is no pneumothorax. The central airways are widely patent. Upper Abdomen: No acute abnormality. In particular, there is no pathologic adenopathy identified and the spleen is of normal size. Musculoskeletal: Remote right seventh rib fracture is seen posteriorly. Degenerative changes are seen within the thoracic spine. No lytic or blastic bone lesions are seen. IMPRESSION: Large right complex pleural effusion suggesting hemorrhagic or proteinaceous components. While partially loculated, the collection appears  predominantly posterior basal in location. Super infection is difficult to exclude. Extensive asymmetric pulmonary infiltrate and peribronchial nodularity. See differential considerations above. In the acute setting, this is most in keeping with atypical infection. Asymmetric mild to moderate interstitial pulmonary edema involving the left lung. Electronically Signed   By: Fidela Salisbury MD   On: 09/08/2019 15:51   DG CHEST PORT 1 VIEW  Result Date: 09/21/2019 CLINICAL DATA:  64 year old male with right hydropneumothorax, right chest tube now removed. EXAM: PORTABLE CHEST 1 VIEW COMPARISON:  Chest CT 09/14/2019, portable chest 09/13/2019 and earlier. FINDINGS: Portable AP semi upright view at 2126  hours. The small caliber right chest tube seen on the prior CT is no longer identified. There is persistent veiling opacity at the right lung base, but no pneumothorax identified. Stable lung volumes. Stable cardiac size and mediastinal contours. Coarse bilateral pulmonary interstitial markings persist. Ventilation in the left lung is stable since 09/13/2019. No acute osseous abnormality identified. Negative visible bowel gas pattern. IMPRESSION: 1. Right chest tube removed with probable re-accumulation of right lung base pleural effusion, but no pneumothorax identified. 2. Continued coarse bilateral pulmonary interstitial opacity with differential consideration of viral/atypical respiratory infection and edema. Electronically Signed   By: Genevie Ann M.D.   On: 09/21/2019 21:42   DG CHEST PORT 1 VIEW  Result Date: 09/13/2019 CLINICAL DATA:  Shortness of breath EXAM: PORTABLE CHEST 1 VIEW COMPARISON:  Same day radiograph FINDINGS: The cardiomediastinal silhouette is unchanged in contour.Right-sided chest tube. Unchanged small right hydropneumothorax. Mild diffuse interstitial prominence and right basilar heterogeneous opacities, unchanged. Visualized abdomen is unremarkable. Multilevel degenerative changes of the thoracic spine. IMPRESSION: 1. Unchanged small right hydropneumothorax with right-sided chest tube in place. Electronically Signed   By: Valentino Saxon MD   On: 09/13/2019 13:23   DG CHEST PORT 1 VIEW  Result Date: 09/13/2019 CLINICAL DATA:  Shortness of breath EXAM: PORTABLE CHEST 1 VIEW COMPARISON:  September 12, 2019 FINDINGS: The cardiomediastinal silhouette is unchanged in contour. Small right pleural effusion with a flattened appearance likely reflecting a small basilar pneumothorax component. Right chest tube in place. No significant left pneumothorax. Persistent interstitial prominence diffusely. Right basilar heterogeneous opacities, likely atelectasis. Visualized abdomen is unremarkable.  Dextrocurvature of the thoracic spine with multilevel degenerative changes of the thoracic spine. IMPRESSION: 1. Small right hydropneumothorax with right chest tube in place, not significantly changed. 2.  Mild pulmonary edema with right basilar atelectasis. Electronically Signed   By: Valentino Saxon MD   On: 09/13/2019 08:28   DG CHEST PORT 1 VIEW  Result Date: 09/12/2019 CLINICAL DATA:  Empyema, chest tube EXAM: PORTABLE CHEST 1 VIEW COMPARISON:  09/10/2019 FINDINGS: No significant change in AP portable examination. Right-sided chest tube remains in position without significant pneumothorax appreciated. There is unchanged diffuse interstitial pulmonary opacity bilaterally and small, right greater than left pleural effusions. No new airspace opacity. The heart is normal in size. IMPRESSION: 1. No significant change in AP portable examination. Right-sided chest tube remains in position without significant pneumothorax appreciated. 2. There is unchanged diffuse interstitial pulmonary opacity bilaterally and small, right greater than left pleural effusions. 3.  No new airspace opacity. Electronically Signed   By: [name] Candle M.D.   On: 09/12/2019 10:43   DG Chest Port 1 View  Result Date: 09/10/2019 CLINICAL DATA:  64 year old male with respiratory failure EXAM: PORTABLE CHEST 1 VIEW COMPARISON:  09/09/2019 FINDINGS: Cardiomediastinal silhouette unchanged in size and contour. Similar appearance of mixed interstitial and airspace opacities,  with slight improved aeration in the right mid lung. Thoracostomy tube within the right chest terminating towards the apex. Meniscus at the base of the right lung with partial obscuration of the right hemidiaphragm. There is likely a small ex vacuo pneumothorax. No left-sided pneumothorax or pleural effusion. IMPRESSION: Unchanged right-sided thoracostomy tube with trace residual pleural fluid at the base the right lung and likely a small ex vacuo pneumothorax.  Persisting bilateral right greater than left airspace and interstitial disease, slightly improved. Electronically Signed   By: Corrie Mckusick D.O.   On: 09/10/2019 08:48   DG Chest Port 1 View  Result Date: 09/09/2019 CLINICAL DATA:  Acute respiratory failure, empyema, status post chest tube placement EXAM: PORTABLE CHEST 1 VIEW COMPARISON:  09/08/2019 FINDINGS: Slight interval improvement in consolidation of the right lung, with chest tube remaining in position and no significant pneumothorax. Unchanged underlying diffuse bilateral interstitial pulmonary opacity. The heart and mediastinum are unremarkable. IMPRESSION: 1. Slight interval improvement in consolidation of the right lung, with chest tube remaining in position and no significant pneumothorax. 2. Unchanged underlying diffuse bilateral interstitial pulmonary opacity, consistent with edema and/or multifocal infection. Electronically Signed   By: [name] Candle M.D.   On: 09/09/2019 11:40   DG Chest Port 1 View  Result Date: 09/08/2019 CLINICAL DATA:  Post chest tube placement on the right. Shortness of breath and cough for 1 week. EXAM: PORTABLE CHEST 1 VIEW COMPARISON:  Chest radiograph and CT earlier today. FINDINGS: Placement of right-sided chest tube with decrease in right pleural effusion from earlier today. Partially loculated lateral and basilar components persist. Patchy opacities throughout the right hemithorax may represent pneumonia, atelectasis, or re-expansion pulmonary edema. Diffuse ground-glass nodular opacities throughout the left lung are similar to imaging earlier today. Stable heart size and mediastinal contours. IMPRESSION: 1. Placement of right-sided chest tube with decreased right pleural effusion from earlier today. Small loculated lateral and basilar components persist. 2. Patchy opacities throughout the right hemithorax may represent pneumonia, atelectasis, or re-expansion pulmonary edema. 3. Stable radiographic appearance of  left lung with diffuse ground-glass and nodular opacities. Electronically Signed   By: Keith Rake M.D.   On: 09/08/2019 17:03   DG CHEST PORT 1 VIEW  Result Date: 09/08/2019 CLINICAL DATA:  Status post thoracentesis. EXAM: PORTABLE CHEST 1 VIEW COMPARISON:  September 08, 2019. FINDINGS: Stable cardiomediastinal silhouette. No pneumothorax is noted. Right pleural effusion is significantly smaller status post thoracentesis. Stable right basilar atelectasis or infiltrate is noted. Stable left lung opacity is noted as well concerning for pneumonia. Bony thorax is unremarkable. IMPRESSION: Right pleural effusion is significantly smaller status post thoracentesis. Stable right basilar atelectasis or infiltrate is noted. Stable left lung opacity is noted concerning for pneumonia. Electronically Signed   By: Marijo Conception M.D.   On: 09/08/2019 11:57   DG Chest Port 1 View  Result Date: 09/08/2019 CLINICAL DATA:  Shortness of breath EXAM: PORTABLE CHEST 1 VIEW COMPARISON:  Jul 12, 2019 FINDINGS: There is a sizable right pleural effusion. There is diffuse interstitial and alveolar opacity bilaterally. Heart size and pulmonary vascularity are within normal limits. There is lower thoracic dextroscoliosis. There is an old healed fracture of the right posterior seventh rib. There is left carotid artery calcification IMPRESSION: Sizable right pleural effusion. Diffuse interstitial and alveolar opacity bilaterally consistent with either multifocal pneumonia or edema. There may be a degree of ARDS as well. More thanone of these entities may be present concurrently. Heart size within normal limits. No  adenopathy evident by radiography. There is left carotid artery calcification. Electronically Signed   By: Lowella Grip III M.D.   On: 09/08/2019 09:07      No flowsheet data found.  No results found for: NITRICOXIDE      Assessment & Plan:   Empyema ([place]) Patient is clinically stable has 2 more  additional days of antibiotics.  Have recommended that he finish these.  Chest x-ray today.  He will need a CT going forward but would like to give him a few more weeks before we repeat this.  As long as he is clinically getting better then we will continue to follow closely in the outpatient setting have recommended that he follow-up with a dentist soon. Smoking cessation is encouraged.  Plan  Patient Instructions  Finish Levaquin. May use oxygen as needed to keep O2 saturations greater than 88%  Mucinex DM twice daily as needed for cough and congestion High-protein diet Follow-up with a dentist Work on not smoking Chest x-ray today Follow-up in 3 weeks with Dr. Vaughan Browner and as needed Please contact office for sooner follow up if symptoms do not improve or worsen or seek emergency care       Tobacco abuse Smoking cessation.  Decide on PFTs going forward.  Chronic respiratory insufficiency O2 saturations on room air walking are adequate and above 90%.  Advised to use O2 saturations to keep O2 saturation greater than 88 to 90%.  Will reevaluate on return if oxygen is still needed     Rexene Edison, NP 10/06/2019

## 2019-10-06 NOTE — Patient Instructions (Signed)
Finish Levaquin. May use oxygen as needed to keep O2 saturations greater than 88%  Mucinex DM twice daily as needed for cough and congestion High-protein diet Follow-up with a dentist Work on not smoking Chest x-ray today Follow-up in 3 weeks with Dr. Vaughan Browner and as needed Please contact office for sooner follow up if symptoms do not improve or worsen or seek emergency care

## 2019-10-06 NOTE — Assessment & Plan Note (Signed)
Smoking cessation.  Decide on PFTs going forward.

## 2019-10-06 NOTE — Assessment & Plan Note (Signed)
Patient is clinically stable has 2 more additional days of antibiotics.  Have recommended that he finish these.  Chest x-ray today.  He will need a CT going forward but would like to give him a few more weeks before we repeat this.  As long as he is clinically getting better then we will continue to follow closely in the outpatient setting have recommended that he follow-up with a dentist soon. Smoking cessation is encouraged.  Plan  Patient Instructions  Finish Levaquin. May use oxygen as needed to keep O2 saturations greater than 88%  Mucinex DM twice daily as needed for cough and congestion High-protein diet Follow-up with a dentist Work on not smoking Chest x-ray today Follow-up in 3 weeks with Dr. Vaughan Browner and as needed Please contact office for sooner follow up if symptoms do not improve or worsen or seek emergency care

## 2019-10-11 LAB — FUNGUS CULTURE RESULT

## 2019-10-11 LAB — FUNGUS CULTURE WITH STAIN

## 2019-10-11 LAB — FUNGAL ORGANISM REFLEX

## 2019-10-23 DIAGNOSIS — J984 Other disorders of lung: Secondary | ICD-10-CM | POA: Diagnosis not present

## 2019-10-23 DIAGNOSIS — R0689 Other abnormalities of breathing: Secondary | ICD-10-CM | POA: Diagnosis not present

## 2019-10-23 DIAGNOSIS — J869 Pyothorax without fistula: Secondary | ICD-10-CM | POA: Diagnosis not present

## 2019-10-23 DIAGNOSIS — J9611 Chronic respiratory failure with hypoxia: Secondary | ICD-10-CM | POA: Diagnosis not present

## 2019-10-26 ENCOUNTER — Other Ambulatory Visit: Payer: Self-pay | Admitting: Physician Assistant

## 2019-10-26 ENCOUNTER — Telehealth: Payer: Self-pay | Admitting: Physician Assistant

## 2019-10-26 MED ORDER — GABAPENTIN 300 MG PO CAPS
300.0000 mg | ORAL_CAPSULE | Freq: Every day | ORAL | 3 refills | Status: DC
Start: 2019-10-26 — End: 2020-01-03

## 2019-10-26 NOTE — Telephone Encounter (Signed)
Patient would like to see if gabapentin  Could be bumped up to 300mg  stated it was discussed at his last visit.  Patient would like to see if it could be sent in today.  [facility] [place], [address] AT [facility] [place] Phone:  [phone]  Fax:  [phone]

## 2019-10-26 NOTE — Telephone Encounter (Signed)
Sent!

## 2019-10-26 NOTE — Telephone Encounter (Signed)
Spoke to pt's wife [name], told her [facility] has sent the increase of Gabapentin to the pharmacy. [name] verbalized understanding and will let pt know.

## 2019-10-26 NOTE — Telephone Encounter (Signed)
Please see message and advise 

## 2019-11-18 ENCOUNTER — Encounter: Payer: Self-pay | Admitting: Pulmonary Disease

## 2019-11-18 ENCOUNTER — Ambulatory Visit (INDEPENDENT_AMBULATORY_CARE_PROVIDER_SITE_OTHER): Payer: PPO

## 2019-11-18 ENCOUNTER — Other Ambulatory Visit: Payer: Self-pay

## 2019-11-18 ENCOUNTER — Ambulatory Visit: Payer: PPO | Admitting: Pulmonary Disease

## 2019-11-18 VITALS — BP 118/78 | HR 92 | Temp 95.7°F | Ht 65.0 in | Wt 130.2 lb

## 2019-11-18 DIAGNOSIS — J869 Pyothorax without fistula: Secondary | ICD-10-CM | POA: Diagnosis not present

## 2019-11-18 DIAGNOSIS — Z720 Tobacco use: Secondary | ICD-10-CM

## 2019-11-18 DIAGNOSIS — J90 Pleural effusion, not elsewhere classified: Secondary | ICD-10-CM | POA: Diagnosis not present

## 2019-11-18 NOTE — Addendum Note (Signed)
Addended by: Satira Sark D on: 11/18/2019 10:41 AM   Modules accepted: Orders

## 2019-11-18 NOTE — Progress Notes (Signed)
         Edwin Torres    [id number]    11/23/1955  Primary Care Physician:[place], [place], [place]  Referring Physician: Inda Coke, [address] [place],  [place] [address]  Chief complaint: Follow-up for empyema  HPI: 64 year old with history of depression, alcohol use, seizures and poor dentition admitted with community-acquired pneumonia in July 2021 and empyema s/p chest tube placement and TPA.  Pleural fluid cultures grew strep intermedius which was treated with Levaquin  States that he is continues to do well.  Breathing is back to baseline and he stopped using supplemental oxygen Not on any inhalers at present  During his last admission he was noted to have recurrent right ptosis which has improved and is scheduled to see neurology next month.   Pets: Dogs Occupation: Retired Tourist information centre manager Exposures: No known exposures.  No mold, hot tub, Jacuzzi Smoking history: 40-pack-year smoker.  Continues to smoke half pack per day Travel history: No significant travel history Relevant family history: No significant family history of lung disease   Outpatient Encounter Medications as of 11/18/2019  Medication Sig  . feeding supplement, ENSURE ENLIVE, (ENSURE ENLIVE) LIQD Take 237 mLs by mouth 2 (two) times daily between meals.  . gabapentin (NEURONTIN) 300 MG capsule Take 1 capsule (300 mg total) by mouth at bedtime.  Marland Kitchen ibuprofen (ADVIL) 200 MG tablet Take 600 mg by mouth every 6 (six) hours as needed for headache or moderate pain.   . ferrous sulfate 325 (65 FE) MG tablet Take 1 tablet (325 mg total) by mouth daily with breakfast.   No facility-administered encounter medications on file as of 11/18/2019.    Physical Exam: Blood pressure 118/78, pulse 92, temperature (!) 95.7 F (35.4 C), temperature source Oral, height 5\' 5"  (1.651 m), weight 130 lb 3.2 oz (59.1 kg), SpO2 99 %. Gen:      No acute distress HEENT:  EOMI, sclera anicteric Neck:     No masses; no  thyromegaly Lungs:    Clear to auscultation bilaterally; normal respiratory effort CV:         Regular rate and rhythm; no murmurs Abd:      + bowel sounds; soft, non-tender; no palpable masses, no distension Ext:    No edema; adequate peripheral perfusion Skin:      Warm and dry; no rash Neuro: alert and oriented x 3 Psych: normal mood and affect  Data Reviewed: Imaging: CT chest 09/08/2019-large loculated right effusion with pulmonary infiltrates CT chest 09/14/2019-small hydropneumothorax with basilar component, diffuse groundglass opacity Chest x-ray 10/06/2019-stable loculated posterobasal pleural effusion, reticular infiltrates. I have reviewed the images personally.  PFTs:  Labs:  Assessment:  Strep empyema S/p chest tube and DNase/lytics in July 2021 Clinically he is improved and back to baseline with no evidence of ongoing infection He is finished a prolonged Levaquin course  Repeat chest x-ray today.  He will need repeat CT scan at some point down the line  Active smoker Smoking cessation discussed PFTs in 3 months No need for inhalers at present as he does not have any complaints of dyspnea  Plan/Recommendations: Chest x-ray PFTs in 3 months  Marshell Garfinkel MD [facility] 11/18/2019, 10:25 AM  CC: Inda Coke, PA

## 2019-11-18 NOTE — Patient Instructions (Signed)
We will check your oxygen levels on exertion if normal then stop supplemental oxygen Chest x-ray today PFTs in 3 months and follow-up in clinic after that

## 2019-11-23 ENCOUNTER — Telehealth: Payer: Self-pay | Admitting: Pulmonary Disease

## 2019-11-23 DIAGNOSIS — J984 Other disorders of lung: Secondary | ICD-10-CM | POA: Diagnosis not present

## 2019-11-23 DIAGNOSIS — R0689 Other abnormalities of breathing: Secondary | ICD-10-CM

## 2019-11-23 DIAGNOSIS — J869 Pyothorax without fistula: Secondary | ICD-10-CM | POA: Diagnosis not present

## 2019-11-23 DIAGNOSIS — J9611 Chronic respiratory failure with hypoxia: Secondary | ICD-10-CM | POA: Diagnosis not present

## 2019-11-23 NOTE — Telephone Encounter (Signed)
Called and spoke with pt's wife [name] who stated that pt would like to have O2 discontinued. She stated that pt has not used O2 since 4 days after he was out of the hospital.  Dr. Vaughan Browner, please advise if you are okay with [place] placing order to [employer] to have pt's O2 discontinued.

## 2019-11-24 NOTE — Telephone Encounter (Signed)
Okay to discontinue oxygen order  Please also let him know that his chest x-ray shows some chronic scarring and small effusion on the right. We will continue to monitor this as he is doing well clinically.

## 2019-11-24 NOTE — Telephone Encounter (Signed)
Called and spoke with [name], Patient's wife (DPR).  Dr Matilde Bash results and recommendations given.  Understanding stated.  DME order placed to d/c O2.  Nothing further at this time.

## 2019-11-29 ENCOUNTER — Other Ambulatory Visit: Payer: Self-pay

## 2019-11-29 ENCOUNTER — Other Ambulatory Visit: Payer: PPO

## 2019-11-29 ENCOUNTER — Encounter: Payer: Self-pay | Admitting: Neurology

## 2019-11-29 ENCOUNTER — Ambulatory Visit: Payer: PPO | Admitting: Neurology

## 2019-11-29 VITALS — BP 145/84 | HR 86 | Ht 65.0 in | Wt 132.2 lb

## 2019-11-29 DIAGNOSIS — H02401 Unspecified ptosis of right eyelid: Secondary | ICD-10-CM

## 2019-11-29 NOTE — Progress Notes (Signed)
NEUROLOGY CONSULTATION NOTE  Edwin Torres MRN: [id number] DOB: 07-Apr-1955  Referring provider: Inda Coke, PA Primary care provider: Inda Coke, PA  Reason for consult:  ptosis  Thank you for your kind referral of Edwin Torres for consultation of the above symptoms. Although his history is well known to you, please allow me to reiterate it for the purpose of our medical record. The patient was accompanied to the clinic by his wife who also provides collateral information. Records and images were personally reviewed where available.   HISTORY OF PRESENT ILLNESS: This is a pleasant 64 year old right-handed man presenting for evaluation of recurrent ptosis. He was previously seen in our office in 2018 when he started having right ptosis in December 2017. He was evaluated by Ophthalmology at that time with note of right mydriasis and mild weakness of eye movements, diagnosed with incomplete third nerve palsy. He had an MRI brain with and without contrast, CTA head and neck, which were normal. ESR, CRP, myasthenia panel were negative. On his follow-up 2 months later, ptosis had improved. He was lost to follow-up since symptoms resolved, however presents today due to recurrence of symptoms last June 2021. He and his wife report the right eyelid was completely closed, right pupil sluggish on PCP exam. He had a head CT which did not show any acute changes. He was in the hospital for pneumonia in July and still had the ptosis. His eyelid started opening up around August. He denies any vision changes, headache, no eye pain. He denies any history of migraines, no dizziness. No dysarthria/dysphagia, neck pain. He has slight difficulty reaching for things, no difficulty climbing stairs. He fell off a roof in December 2020 and his feet still hurt. He denies any recent head injuries, he hit his head in December and had staples. No new medications. No family history of similar symptoms. He stopped  drinking alcohol after the fall in December.    PAST MEDICAL HISTORY: Past Medical History:  Diagnosis Date  . Alcohol addiction ([place])   . Allergy   . Cancer ([place])    Melanoma on face was removed  . Depression   . Herpes genitalis in men   . History of chicken pox   . Seizures ([place])     PAST SURGICAL HISTORY: Past Surgical History:  Procedure Laterality Date  . FOOT FRACTURE SURGERY Bilateral   . NOSE SURGERY    . VASECTOMY      MEDICATIONS: Current Outpatient Medications on File Prior to Visit  Medication Sig Dispense Refill  . feeding supplement, ENSURE ENLIVE, (ENSURE ENLIVE) LIQD Take 237 mLs by mouth 2 (two) times daily between meals.    . gabapentin (NEURONTIN) 300 MG capsule Take 1 capsule (300 mg total) by mouth at bedtime. 90 capsule 3  . ibuprofen (ADVIL) 200 MG tablet Take 600 mg by mouth every 6 (six) hours as needed for headache or moderate pain.      No current facility-administered medications on file prior to visit.    ALLERGIES: Allergies  Allergen Reactions  . Penicillins Anaphylaxis    Has patient had a PCN reaction causing immediate rash, facial/tongue/throat swelling, SOB or lightheadedness with hypotension: Yes Has patient had a PCN reaction causing severe rash involving mucus membranes or skin necrosis: No Has patient had a PCN reaction that required hospitalization: Yes Has patient had a PCN reaction occurring within the last 10 years: No If all of the above answers are "NO", then may proceed  with Cephalosporin use.   . Ativan [Lorazepam] Other (See Comments)    Made him crazy    FAMILY HISTORY: Family History  Problem Relation Age of Onset  . Cancer Mother        bladder  . Diabetes Mother   . Arthritis Mother   . Hyperlipidemia Father   . Alcohol abuse Father   . Arthritis Sister     SOCIAL HISTORY: Social History   Socioeconomic History  . Marital status: Married    Spouse name: Not on file  . Number of children: Not on  file  . Years of education: Not on file  . Highest education level: Not on file  Occupational History  . Not on file  Tobacco Use  . Smoking status: Current Every Day Smoker    Packs/day: 1.00  . Smokeless tobacco: Current User  Vaping Use  . Vaping Use: Never used  Substance and Sexual Activity  . Alcohol use: Not Currently  . Drug use: No  . Sexual activity: Yes    Partners: Female    Birth control/protection: None  Other Topics Concern  . Not on file  Social History Narrative   2012 fell from roof, disabled since   Right handed    Lives with wife   Social Determinants of Health   Financial Resource Strain:   . Difficulty of Paying Living Expenses: Not on file  Food Insecurity:   . Worried About Charity fundraiser in the Last Year: Not on file  . Ran Out of Food in the Last Year: Not on file  Transportation Needs:   . Lack of Transportation (Medical): Not on file  . Lack of Transportation (Non-Medical): Not on file  Physical Activity:   . Days of Exercise per Week: Not on file  . Minutes of Exercise per Session: Not on file  Stress:   . Feeling of Stress : Not on file  Social Connections:   . Frequency of Communication with Friends and Family: Not on file  . Frequency of Social Gatherings with Friends and Family: Not on file  . Attends Religious Services: Not on file  . Active Member of Clubs or Organizations: Not on file  . Attends Archivist Meetings: Not on file  . Marital Status: Not on file  Intimate Partner Violence:   . Fear of Current or Ex-Partner: Not on file  . Emotionally Abused: Not on file  . Physically Abused: Not on file  . Sexually Abused: Not on file     PHYSICAL EXAM: Vitals:   11/29/19 1019  BP: (!) 145/84  Pulse: 86  SpO2: 97%   General: No acute distress Head:  Normocephalic/atraumatic Skin/Extremities: No rash, no edema Neurological Exam: Mental status: alert and oriented to person, place, and time, no dysarthria or  aphasia, Fund of knowledge is appropriate.  Recent and remote memory are intact.  Attention and concentration are normal.    Cranial nerves: CN I: not tested CN II: pupils equal, round and both sluggishly reactive to light, visual fields intact CN III, IV, VI:  Mild right ptosis, no fatigable ptosis seen. Full range of motion, no nystagmus. CN V: facial sensation intact CN VII: upper and lower face symmetric CN VIII: hearing intact to conversation CN IX, X: gag intact, uvula midline CN XI: sternocleidomastoid and trapezius muscles intact CN XII: tongue midline Bulk & Tone: normal, no fasciculations. Motor: 5/5 throughout with no pronator drift. Sensation: intact to light touch, cold, pin, vibration  sense.  No extinction to double simultaneous stimulation.  Romberg test negative Deep Tendon Reflexes: +2 throughout Cerebellar: no incoordination on finger to nose testing Gait: narrow-based and steady, able to tandem walk adequately. Tremor: none   IMPRESSION: This is a pleasant 64 year old right-handed man presenting with recurrent right ptosis. Initial episode occurred in December 2017, which took a few months for recovery. He was back to baseline for over 3 years until last June 2021 when he had recurrence of right ptosis that has been improving again over the past few months. There is mild right ptosis today, no fatigability noted. Neurological exam otherwise normal. He had an MRI brain with and without contrast and CTA head and neck which were normal in 2018. Myasthenia panel at that time was negative. 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 Repeat myasthenia panel will be ordered, check ANA. If symptoms recur/worsen, he will be referred to [facility] for single-fiber EMG. He was instructed to call our office for any recurrence, we will plan to do an ice pack test and trial of mestinon at that point. Follow-up in 6 months, they know to call for any changes.    Thank you for allowing me to participate in the care of this patient. Please do not hesitate to call for any questions or concerns.   Edwin Torres, M.D.  CC: Edwin Torres, [place]

## 2019-11-29 NOTE — Patient Instructions (Signed)
1. Bloodwork will be done today, check AchR Ab, MUSK Ab, ANA  2. Depending on results, we may do a nerve and muscle test  3. Follow-up in 6 months, call for any changes

## 2019-12-05 LAB — ANTI-NUCLEAR AB-TITER (ANA TITER): ANA Titer 1: 1:160 {titer} — ABNORMAL HIGH

## 2019-12-05 LAB — ANA: Anti Nuclear Antibody (ANA): POSITIVE — AB

## 2019-12-06 ENCOUNTER — Encounter: Payer: Self-pay | Admitting: Neurology

## 2019-12-08 LAB — ACHR ABS WITH REFLEX TO MUSK: AChR Binding Ab, Serum: <0.03 nmol/L (ref 0.00–0.24)

## 2019-12-08 LAB — MUSK ANTIBODIES: MuSK Antibodies: <1 U/mL

## 2019-12-09 NOTE — Progress Notes (Signed)
Patient followed up with Dr. Vaughan Browner on 11/18/2019 with a f/u CXR.  Nothing further needed.

## 2020-01-03 ENCOUNTER — Telehealth: Payer: Self-pay

## 2020-01-03 MED ORDER — GABAPENTIN 300 MG PO CAPS
300.0000 mg | ORAL_CAPSULE | Freq: Every day | ORAL | 3 refills | Status: DC
Start: 2020-01-03 — End: 2020-01-13

## 2020-01-03 NOTE — Telephone Encounter (Signed)
Medication refilled

## 2020-01-03 NOTE — Telephone Encounter (Signed)
°  LAST APPOINTMENT DATE: 10/26/2019   NEXT APPOINTMENT DATE:@11 /19/2021  MEDICATION: gabapentin (NEURONTIN) 300 MG capsule  PHARMACY: [id number] - [place], [place] - [address] AT [address]  Comments: asking for a early refill as he only has 3 pills left. Edwin Torres states that he has been taking more than prescribed because sometimes the itch is so bad before bed that he is needing to take more.   Please advise

## 2020-01-13 ENCOUNTER — Other Ambulatory Visit (HOSPITAL_COMMUNITY)
Admission: RE | Admit: 2020-01-13 | Discharge: 2020-01-13 | Disposition: A | Discharge status: Home / Self Care | Payer: PPO | Source: Ambulatory Visit | Attending: Physician Assistant | Admitting: Physician Assistant

## 2020-01-13 ENCOUNTER — Encounter: Payer: Self-pay | Admitting: Physician Assistant

## 2020-01-13 ENCOUNTER — Ambulatory Visit (INDEPENDENT_AMBULATORY_CARE_PROVIDER_SITE_OTHER): Payer: PPO | Admitting: Physician Assistant

## 2020-01-13 ENCOUNTER — Other Ambulatory Visit: Payer: Self-pay

## 2020-01-13 VITALS — BP 122/70 | HR 71 | Temp 98.8°F | Ht 65.0 in | Wt 137.2 lb

## 2020-01-13 DIAGNOSIS — M7989 Other specified soft tissue disorders: Secondary | ICD-10-CM | POA: Diagnosis not present

## 2020-01-13 DIAGNOSIS — R7309 Other abnormal glucose: Secondary | ICD-10-CM | POA: Diagnosis not present

## 2020-01-13 DIAGNOSIS — L299 Pruritus, unspecified: Secondary | ICD-10-CM | POA: Diagnosis not present

## 2020-01-13 DIAGNOSIS — L308 Other specified dermatitis: Secondary | ICD-10-CM

## 2020-01-13 DIAGNOSIS — Z1211 Encounter for screening for malignant neoplasm of colon: Secondary | ICD-10-CM | POA: Diagnosis not present

## 2020-01-13 LAB — POCT GLYCOSYLATED HEMOGLOBIN (HGB A1C): Hemoglobin A1C: 6.4 % — AB (ref 4.0–5.6)

## 2020-01-13 MED ORDER — GABAPENTIN 300 MG PO CAPS: 300.0000 mg | ORAL_CAPSULE | Freq: Two times a day (BID) | ORAL | 2 refills | Status: DC

## 2020-01-13 NOTE — Progress Notes (Signed)
Edwin Torres is a 64 y.o. male is here for follow up.  I acted as a Education administrator for [employer], PA-C Anselmo Pickler, LPN   History of Present Illness:   Chief Complaint  Patient presents with  . f/u pneumonia  . prediabetic    HPI   Pre-diabetes Patient with elevated HgbA1c of 6.5% in May 2021. He continues to work on diet and healthy lifestyle as able. Avoids frequent sweets. Denies concerns for hypo- or hyper-glycemia.  Colon cancer screening Agreeable to cologuard. Denies any rectal bleeding, unintentional weight loss, or abdominal pain.  Chronic pruritis Taking 300 mg gabapentin with good relief but notices significant recurrence if he is not taking this. Sometimes will take two in one day and gets good benefit from that. Has slight rough area of skin on b/l forearms, uses anti-itch lotion without much relief.  L face mass Has a slight nodule to L jawline. Has been there for years. Feels like it is getting bigger with time. Denies pain.  Health Maintenance Due  Topic Date Due  . Fecal DNA (Cologuard)  Never done    Past Medical History:  Diagnosis Date  . Alcohol addiction ([place])   . Allergy   . Cancer ([place])    Melanoma on face was removed  . Depression   . Herpes genitalis in men   . History of chicken pox   . Seizures ([place])      Social History   Tobacco Use  . Smoking status: Current Every Day Smoker    Packs/day: 1.00  . Smokeless tobacco: Current User  Vaping Use  . Vaping Use: Never used  Substance Use Topics  . Alcohol use: Not Currently  . Drug use: No    Past Surgical History:  Procedure Laterality Date  . FOOT FRACTURE SURGERY Bilateral   . NOSE SURGERY    . VASECTOMY      Family History  Problem Relation Age of Onset  . Cancer Mother        bladder  . Diabetes Mother   . Arthritis Mother   . Hyperlipidemia Father   . Alcohol abuse Father   . Arthritis Sister     PMHx, SurgHx, SocialHx, FamHx, Medications, and Allergies  were reviewed in the Visit Navigator and updated as appropriate.   Patient Active Problem List   Diagnosis Date Noted  . Chronic respiratory insufficiency 09/22/2019  . Sepsis ([place]) 09/14/2019  . Malnutrition of moderate degree 09/14/2019  . CAP (community acquired pneumonia) 09/11/2019  . Acute hypoxemic respiratory failure ([place]) 09/08/2019  . Chest tube in place   . Empyema ([place])   . Patient non-compliant, refuses any preventative care measures 04/04/2019  . Hypomagnesemia 03/10/2019  . Dysphagia 03/04/2019  . Moderate Neurocognitive deficits 03/04/2019  . Hypokalemia 02/24/2019  . Somnolence, daytime 02/24/2019  . Alcohol abuse with intoxication ([place]) 02/22/2019  . Frequent falls 02/22/2019  . Macrocytic anemia 02/22/2019  . Tobacco abuse 02/22/2019  . Physical deconditioning 02/22/2019  . Thrombocytopenia ([place]) 02/22/2019  . Weakness of right third cranial nerve 03/21/2016    Social History   Tobacco Use  . Smoking status: Current Every Day Smoker    Packs/day: 1.00  . Smokeless tobacco: Current User  Vaping Use  . Vaping Use: Never used  Substance Use Topics  . Alcohol use: Not Currently  . Drug use: No    Current Medications and Allergies:    Current Outpatient Medications:  .  feeding supplement, ENSURE ENLIVE, (ENSURE  ENLIVE) LIQD, Take 237 mLs by mouth 2 (two) times daily between meals., Disp: , Rfl:  .  gabapentin (NEURONTIN) 300 MG capsule, Take 1 capsule (300 mg total) by mouth 2 (two) times daily., Disp: 180 capsule, Rfl: 2 .  ibuprofen (ADVIL) 200 MG tablet, Take 600 mg by mouth every 6 (six) hours as needed for headache or moderate pain. , Disp: , Rfl:    Allergies  Allergen Reactions  . Penicillins Anaphylaxis    Has patient had a PCN reaction causing immediate rash, facial/tongue/throat swelling, SOB or lightheadedness with hypotension: Yes Has patient had a PCN reaction causing severe rash involving mucus membranes or skin necrosis: No Has  patient had a PCN reaction that required hospitalization: Yes Has patient had a PCN reaction occurring within the last 10 years: No If all of the above answers are "NO", then may proceed with Cephalosporin use.   . Ativan [Lorazepam] Other (See Comments)    Made him crazy    Review of Systems   ROS  Negative unless otherwise specified per HPI.  Vitals:   Vitals:   01/13/20 1328  BP: 122/70  Pulse: 71  Temp: 98.8 F (37.1 C)  TempSrc: Temporal  SpO2: 97%  Weight: 137 lb 4 oz (62.3 kg)  Height: 5\' 5"  (1.651 m)     Body mass index is 22.84 kg/m.   Physical Exam:    Physical Exam Vitals and nursing note reviewed.  Constitutional:      General: He is not in acute distress.    Appearance: He is well-developed. He is not ill-appearing or toxic-appearing.  Cardiovascular:     Rate and Rhythm: Normal rate and regular rhythm.     Pulses: Normal pulses.     Heart sounds: Normal heart sounds, S1 normal and S2 normal.     Comments: No LE edema Pulmonary:     Effort: Pulmonary effort is normal.     Breath sounds: Normal breath sounds.  Skin:    General: Skin is warm and dry.     Comments: Light brown excoriated rash on anterior forearms  Approximately 1 inch cyst-like area to L mid jawline  Neurological:     Mental Status: He is alert.     GCS: GCS eye subscore is 4. GCS verbal subscore is 5. GCS motor subscore is 6.  Psychiatric:        Speech: Speech normal.        Behavior: Behavior normal. Behavior is cooperative.    Skin biopsy  Meds, vitals, and allergies reviewed.   Indication: suspicious lesion; chronic pruritus  Location: r arm  Size: 3 mm  Informed consent obtained.  Pt aware of risks not limited to but including infection,  bleeding, damage to near by organs.  Prep: etoh/betadine  Anesthesia: 1%lidocaine with epi, good effect  biospy with 68mm punch biopsy  Minimal oozing, controlled with silver nitrate  Tolerated well   Results for orders  placed or performed in visit on 01/13/20  POCT glycosylated hemoglobin (Hb A1C)  Result Value Ref Range   Hemoglobin A1C 6.4 (A) 4.0 - 5.6 %      Assessment and Plan:    Edwin Torres was seen today for f/u pneumonia and prediabetic.  Diagnoses and all orders for this visit:  Elevated hemoglobin A1c A1c slightly improved. Will check on a yearly basis. Continue healthy lifestyle as able. -     POCT glycosylated hemoglobin (Hb A1C)  Screening for colon cancer -  Cologuard  Chronic pruritus Biopsied area of excoriation to see if we can get more information on chronic pruritus. Increase gabapentin to 300 mg BID. Derm referral. -     Surgical pathology( [facility]/ [facility])  Mass of soft tissue of face Derm referral.  Other orders -     gabapentin (NEURONTIN) 300 MG capsule; Take 1 capsule (300 mg total) by mouth 2 (two) times daily.     CMA or LPN served as scribe during this visit. History, Physical, and Plan performed by medical provider. The above documentation has been reviewed and is accurate and complete.   Inda Coke, PA-C [place], [place] 01/13/2020  Follow-up: No follow-ups on file.

## 2020-01-13 NOTE — Patient Instructions (Addendum)
It was great to see you!  Increase gabapentin 300 mg twice daily  We will be in touch with your results and we will put in referral for dermatology, they will be in touch for an appointment  Follow-up in 6 months, sooner if concerns.  Take care,  Inda Coke PA-C

## 2020-01-18 ENCOUNTER — Other Ambulatory Visit: Payer: Self-pay

## 2020-01-20 LAB — SURGICAL PATHOLOGY

## 2020-01-23 ENCOUNTER — Other Ambulatory Visit: Payer: Self-pay | Admitting: Physician Assistant

## 2020-01-23 MED ORDER — TRIAMCINOLONE ACETONIDE 0.1 % EX CREA: TOPICAL_CREAM | CUTANEOUS | 0 refills | Status: DC

## 2020-02-09 ENCOUNTER — Telehealth: Payer: Self-pay

## 2020-02-09 NOTE — Telephone Encounter (Signed)
Patient's wife is calling in saying that [place] told Ludwig Clarks that she can take 2 pills of the gabapentin (NEURONTIN) 300 MG capsule and [name] says that it worked for a little bit but it is no longer working, asked if they can go up on the dosage.

## 2020-02-10 ENCOUNTER — Other Ambulatory Visit: Payer: Self-pay | Admitting: Physician Assistant

## 2020-02-10 MED ORDER — GABAPENTIN 300 MG PO CAPS: 300.0000 mg | ORAL_CAPSULE | Freq: Three times a day (TID) | ORAL | 3 refills | Status: DC

## 2020-02-10 NOTE — Telephone Encounter (Signed)
LM notifying of new AJ

## 2020-02-10 NOTE — Telephone Encounter (Signed)
May increase to 300 mg gabapentin -- 3 times daily.  I have sent this in.

## 2020-06-14 ENCOUNTER — Ambulatory Visit (INDEPENDENT_AMBULATORY_CARE_PROVIDER_SITE_OTHER): Payer: PPO | Admitting: Physician Assistant

## 2020-06-14 ENCOUNTER — Other Ambulatory Visit: Payer: Self-pay

## 2020-06-14 DIAGNOSIS — L308 Other specified dermatitis: Secondary | ICD-10-CM | POA: Diagnosis not present

## 2020-06-14 DIAGNOSIS — L723 Sebaceous cyst: Secondary | ICD-10-CM | POA: Diagnosis not present

## 2020-06-21 ENCOUNTER — Ambulatory Visit (INDEPENDENT_AMBULATORY_CARE_PROVIDER_SITE_OTHER): Payer: PPO

## 2020-06-21 DIAGNOSIS — Z0000 Encounter for general adult medical examination without abnormal findings: Secondary | ICD-10-CM

## 2020-06-21 NOTE — Progress Notes (Signed)
Virtual Visit via Telephone Note  I connected with  Edwin Torres on 06/21/20 at  9:30 AM EDT by telephone and verified that I am speaking with the correct person using two identifiers.  Medicare Annual Wellness visit completed telephonically due to Covid-19 pandemic.   Persons participating in this call: This Health Coach and this patient.   Location: Patient: Home Provider: Office   I discussed the limitations, risks, security and privacy concerns of performing an evaluation and management service by telephone and the availability of in person appointments. The patient expressed understanding and agreed to proceed.  Unable to perform video visit due to video visit attempted and failed and/or patient does not have video capability.   Some vital signs may be absent or patient reported.   Willette Brace, LPN    Subjective:   Edwin Torres is a 65 y.o. male who presents for Medicare Annual/Subsequent preventive examination.  Review of Systems     Cardiac Risk Factors include: advanced age (>34men, >84 women)     Objective:    There were no vitals filed for this visit. There is no height or weight on file to calculate BMI.  Advanced Directives 06/21/2020 11/29/2019 09/08/2019 05/19/2019 03/10/2019 03/04/2019 02/23/2019  Does Patient Have a Medical Advance Directive? No No No No Yes Yes -  Type of Advance Directive - - - - (No Data) (No Data) -  Does patient want to make changes to medical advance directive? - - - - No - Patient declined No - Patient declined -  Would patient like information on creating a medical advance directive? No - Patient declined - No - Patient declined Yes (MAU/Ambulatory/Procedural Areas - Information given) - - No - Patient declined    Current Medications (verified) Outpatient Encounter Medications as of 06/21/2020  Medication Sig  . feeding supplement, ENSURE ENLIVE, (ENSURE ENLIVE) LIQD Take 237 mLs by mouth 2 (two) times daily between meals.  .  gabapentin (NEURONTIN) 300 MG capsule Take 1 capsule (300 mg total) by mouth 3 (three) times daily.  Marland Kitchen ibuprofen (ADVIL) 200 MG tablet Take 600 mg by mouth every 6 (six) hours as needed for headache or moderate pain.   Marland Kitchen triamcinolone (KENALOG) 0.1 % Apply to affected area 1-2 times daily (Patient not taking: Reported on 06/21/2020)   No facility-administered encounter medications on file as of 06/21/2020.    Allergies (verified) Penicillins and Ativan [lorazepam]   History: Past Medical History:  Diagnosis Date  . Alcohol addiction ([place])   . Allergy   . Cancer ([place])    Melanoma on face was removed  . Depression   . Herpes genitalis in men   . History of chicken pox   . Seizures ([place])    Past Surgical History:  Procedure Laterality Date  . FOOT FRACTURE SURGERY Bilateral   . NOSE SURGERY    . VASECTOMY     Family History  Problem Relation Age of Onset  . Cancer Mother        bladder  . Diabetes Mother   . Arthritis Mother   . Hyperlipidemia Father   . Alcohol abuse Father   . Arthritis Sister    Social History   Socioeconomic History  . Marital status: Married    Spouse name: Not on file  . Number of children: Not on file  . Years of education: Not on file  . Highest education level: Not on file  Occupational History  . Not on file  Tobacco  Use  . Smoking status: Current Every Day Smoker    Packs/day: 1.00  . Smokeless tobacco: Current User  Vaping Use  . Vaping Use: Never used  Substance and Sexual Activity  . Alcohol use: Not Currently  . Drug use: No  . Sexual activity: Yes    Partners: Female    Birth control/protection: None  Other Topics Concern  . Not on file  Social History Narrative   2012 fell from roof, disabled since   Right handed    Lives with wife   Social Determinants of Health   Financial Resource Strain: Low Risk   . Difficulty of Paying Living Expenses: Not hard at all  Food Insecurity: No Food Insecurity  . Worried About  Programme researcher, broadcasting/film/video in the Last Year: Never true  . Ran Out of Food in the Last Year: Never true  Transportation Needs: No Transportation Needs  . Lack of Transportation (Medical): No  . Lack of Transportation (Non-Medical): No  Physical Activity: Sufficiently Active  . Days of Exercise per Week: 5 days  . Minutes of Exercise per Session: 30 min  Stress: No Stress Concern Present  . Feeling of Stress : Not at all  Social Connections: Moderately Isolated  . Frequency of Communication with Friends and Family: Twice a week  . Frequency of Social Gatherings with Friends and Family: Twice a week  . Attends Religious Services: Never  . Active Member of Clubs or Organizations: No  . Attends Banker Meetings: Never  . Marital Status: Married    Tobacco Counseling Ready to quit: Not Answered Counseling given: Not Answered   Clinical Intake:  Pre-visit preparation completed: Yes  Pain : No/denies pain     BMI - recorded: 22.84 Nutritional Status: BMI of 19-24  Normal Nutritional Risks: None Diabetes: No  How often do you need to have someone help you when you read instructions, pamphlets, or other written materials from your doctor or pharmacy?: 1 - Never  Diabetic?No  Interpreter Needed?: No  Information entered by :: Lanier Ensign, LPN   Activities of Daily Living In your present state of health, do you have any difficulty performing the following activities: 06/21/2020 09/08/2019  Hearing? N N  Vision? N N  Difficulty concentrating or making decisions? N Y  Walking or climbing stairs? Y Y  Comment due to feet injuries secondary to weakness and shortness of breath  Dressing or bathing? N Y  Doing errands, shopping? N Y  Quarry manager and eating ? N -  Using the Toilet? N -  In the past six months, have you accidently leaked urine? N -  Do you have problems with loss of bowel control? N -  Managing your Medications? N -  Managing your Finances? N -   Housekeeping or managing your Housekeeping? N -  Some recent data might be hidden    Patient Care Team: Jarold Motto, [place] as PCP - General (Physician Assistant) Karel Jarvis Lesle Chris, MD as Consulting Physician (Neurology)  Indicate any recent Medical Services you may have received from other than [facility] providers in the past year (date may be approximate).     Assessment:   This is a routine wellness examination for Friend.  Hearing/Vision screen  Hearing Screening   125Hz  250Hz  500Hz  1000Hz  2000Hz  3000Hz  4000Hz  6000Hz  8000Hz   Right ear:           Left ear:           Comments: Pt denies  any hearing issues   Vision Screening Comments: Pt encouraged to follow up with eye exams   Dietary issues and exercise activities discussed: Current Exercise Habits: Home exercise routine, Type of exercise: walking, Time (Minutes): 30, Frequency (Times/Week): 5, Weekly Exercise (Minutes/Week): 150  Goals    . Patient Stated     None at this time       Depression Screen PHQ 2/9 Scores 06/21/2020 05/19/2019 08/20/2016 03/10/2016  PHQ - 2 Score 0 0 0 0    Fall Risk Fall Risk  06/21/2020 11/29/2019 05/19/2019 08/20/2016 05/14/2016  Falls in the past year? 0 1 1 No No  Number falls in past yr: 0 1 1 - -  Injury with Fall? 0 1 1 - -  Risk for fall due to : Impaired vision - History of fall(s);Impaired mobility - -  Follow up Falls prevention discussed - Education provided;Falls prevention discussed;Falls evaluation completed - -    FALL RISK PREVENTION PERTAINING TO THE HOME:  Any stairs in or around the home? No  If so, are there any without handrails? No  Home free of loose throw rugs in walkways, pet beds, electrical cords, etc? Yes  Adequate lighting in your home to reduce risk of falls? Yes   ASSISTIVE DEVICES UTILIZED TO PREVENT FALLS:  Life alert? No  Use of a cane, walker or w/c? Yes  Grab bars in the bathroom? No  Shower chair or bench in shower? Yes  Elevated toilet seat or a  handicapped toilet? No   TIMED UP AND GO:  Was the test performed? No     Cognitive Function: Declined      6CIT Screen 05/19/2019  What Year? 0 points  What month? 0 points  What time? 0 points  Count back from 20 0 points  Months in reverse 0 points  Repeat phrase 0 points  Total Score 0    Immunizations Immunization History  Administered Date(s) Administered  . Pneumococcal Conjugate-13 04/04/2019  . Tdap 07/11/2016, 02/22/2019    TDAP status: Up to date  Flu Vaccine status: Declined, Education has been provided regarding the importance of this vaccine but patient still declined. Advised may receive this vaccine at local pharmacy or Health Dept. Aware to provide a copy of the vaccination record if obtained from local pharmacy or Health Dept. Verbalized acceptance and understanding.  Pneumococcal vaccine status: Due, Education has been provided regarding the importance of this vaccine. Advised may receive this vaccine at local pharmacy or Health Dept. Aware to provide a copy of the vaccination record if obtained from local pharmacy or Health Dept. Verbalized acceptance and understanding.  Covid-19 vaccine status: Declined, Education has been provided regarding the importance of this vaccine but patient still declined. Advised may receive this vaccine at local pharmacy or Health Dept.or vaccine clinic. Aware to provide a copy of the vaccination record if obtained from local pharmacy or Health Dept. Verbalized acceptance and understanding.  Qualifies for Shingles Vaccine? Yes   Zostavax completed No   Shingrix Completed?: No.    Education has been provided regarding the importance of this vaccine. Patient has been advised to call insurance company to determine out of pocket expense if they have not yet received this vaccine. Advised may also receive vaccine at local pharmacy or Health Dept. Verbalized acceptance and understanding.  Screening Tests Health Maintenance  Topic  Date Due  . Hepatitis C Screening  Never done  . Fecal DNA (Cologuard)  Never done  . PNA vac Low  Risk Adult (2 of 2 - PPSV23) 04/03/2020  . COVID-19 Vaccine (1) 07/07/2020 (Originally 03/31/1960)  . TETANUS/TDAP  02/21/2029  . HPV VACCINES  Aged Out  . INFLUENZA VACCINE  Discontinued    Health Maintenance  Health Maintenance Due  Topic Date Due  . Hepatitis C Screening  Never done  . Fecal DNA (Cologuard)  Never done  . PNA vac Low Risk Adult (2 of 2 - PPSV23) 04/03/2020    Colorectal cancer screening: Type of screening: Cologuard. Completed pt stated he has to send in already recieved cologuard. Repeat every 3 years   Lung Cancer Screening Referral: Declined    Additional Screening:  Hepatitis C Screening: does qualify;   Vision Screening: Recommended annual ophthalmology exams for early detection of glaucoma and other disorders of the eye. Is the patient up to date with their annual eye exam?  No  Who is the provider or what is the name of the office in which the patient attends annual eye exams? Encouraged pt to follow up with eye exams  If pt is not established with a provider, would they like to be referred to a provider to establish care? No .   Dental Screening: Recommended annual dental exams for proper oral hygiene  Community Resource Referral / Chronic Care Management: CRR required this visit?  No   CCM required this visit?  No      Plan:     I have personally reviewed and noted the following in the patient's chart:   . Medical and social history . Use of alcohol, tobacco or illicit drugs  . Current medications and supplements . Functional ability and status . Nutritional status . Physical activity . Advanced directives . List of other physicians . Hospitalizations, surgeries, and ER visits in previous 12 months . Vitals . Screenings to include cognitive, depression, and falls . Referrals and appointments  In addition, I have reviewed and  discussed with patient certain preventive protocols, quality metrics, and best practice recommendations. A written personalized care plan for preventive services as well as general preventive health recommendations were provided to patient.     Willette Brace, LPN   03/20/5807   Nurse Notes: Geryl Councilman

## 2020-06-21 NOTE — Patient Instructions (Addendum)
[name] , Thank you for taking time to come for your Medicare Wellness Visit. I appreciate your ongoing commitment to your health goals. Please review the following plan we discussed and let me know if I can assist you in the future.   Screening recommendations/referrals: Colonoscopy: Pt stated he has cologuard and will send in  Recommended yearly ophthalmology/optometry visit for glaucoma screening and checkup Recommended yearly dental visit for hygiene and checkup  Vaccinations: Influenza vaccine: Declined  Pneumococcal vaccine: Due and discussed Tdap vaccine: Up to date Shingles vaccine: Shingrix discussed. Please contact your pharmacy for coverage information.    Covid-19: Declined   Advanced directives: Advance directive discussed with you today. Even though you declined this today please call our office should you change your mind and we can give you the proper paperwork for you to fill out.  Conditions/risks identified: None at this time   Next appointment: Follow up in one year for your annual wellness visit.   Preventive Care 66 Years and Older, Male Preventive care refers to lifestyle choices and visits with your health care provider that can promote health and wellness. What does preventive care include?  A yearly physical exam. This is also called an annual well check.  Dental exams once or twice a year.  Routine eye exams. Ask your health care provider how often you should have your eyes checked.  Personal lifestyle choices, including:  Daily care of your teeth and gums.  Regular physical activity.  Eating a healthy diet.  Avoiding tobacco and drug use.  Limiting alcohol use.  Practicing safe sex.  Taking low doses of aspirin every day.  Taking vitamin and mineral supplements as recommended by your health care provider. What happens during an annual well check? The services and screenings done by your health care provider during your annual well check  will depend on your age, overall health, lifestyle risk factors, and family history of disease. Counseling  Your health care provider may ask you questions about your:  Alcohol use.  Tobacco use.  Drug use.  Emotional well-being.  Home and relationship well-being.  Sexual activity.  Eating habits.  History of falls.  Memory and ability to understand (cognition).  Work and work Statistician. Screening  You may have the following tests or measurements:  Height, weight, and BMI.  Blood pressure.  Lipid and cholesterol levels. These may be checked every 5 years, or more frequently if you are over 107 years old.  Skin check.  Lung cancer screening. You may have this screening every year starting at age 23 if you have a 30-pack-year history of smoking and currently smoke or have quit within the past 15 years.  Fecal occult blood test (FOBT) of the stool. You may have this test every year starting at age 70.  Flexible sigmoidoscopy or colonoscopy. You may have a sigmoidoscopy every 5 years or a colonoscopy every 10 years starting at age 20.  Prostate cancer screening. Recommendations will vary depending on your family history and other risks.  Hepatitis C blood test.  Hepatitis B blood test.  Sexually transmitted disease (STD) testing.  Diabetes screening. This is done by checking your blood sugar (glucose) after you have not eaten for a while (fasting). You may have this done every 1-3 years.  Abdominal aortic aneurysm (AAA) screening. You may need this if you are a current or former smoker.  Osteoporosis. You may be screened starting at age 47 if you are at high risk. Talk with your  health care provider about your test results, treatment options, and if necessary, the need for more tests. Vaccines  Your health care provider may recommend certain vaccines, such as:  Influenza vaccine. This is recommended every year.  Tetanus, diphtheria, and acellular pertussis  (Tdap, Td) vaccine. You may need a Td booster every 10 years.  Zoster vaccine. You may need this after age 18.  Pneumococcal 13-valent conjugate (PCV13) vaccine. One dose is recommended after age 9.  Pneumococcal polysaccharide (PPSV23) vaccine. One dose is recommended after age 70. Talk to your health care provider about which screenings and vaccines you need and how often you need them. This information is not intended to replace advice given to you by your health care provider. Make sure you discuss any questions you have with your health care provider. Document Released: 03/09/2015 Document Revised: 10/31/2015 Document Reviewed: 12/12/2014 Elsevier Interactive Patient Education  2017 [place] Prevention in the Home Falls can cause injuries. They can happen to people of all ages. There are many things you can do to make your home safe and to help prevent falls. What can I do on the outside of my home?  Regularly fix the edges of walkways and driveways and fix any cracks.  Remove anything that might make you trip as you walk through a door, such as a raised step or threshold.  Trim any bushes or trees on the path to your home.  Use bright outdoor lighting.  Clear any walking paths of anything that might make someone trip, such as rocks or tools.  Regularly check to see if handrails are loose or broken. Make sure that both sides of any steps have handrails.  Any raised decks and porches should have guardrails on the edges.  Have any leaves, snow, or ice cleared regularly.  Use sand or salt on walking paths during winter.  Clean up any spills in your garage right away. This includes oil or grease spills. What can I do in the bathroom?  Use night lights.  Install grab bars by the toilet and in the tub and shower. Do not use towel bars as grab bars.  Use non-skid mats or decals in the tub or shower.  If you need to sit down in the shower, use a plastic, non-slip  stool.  Keep the floor dry. Clean up any water that spills on the floor as soon as it happens.  Remove soap buildup in the tub or shower regularly.  Attach bath mats securely with double-sided non-slip rug tape.  Do not have throw rugs and other things on the floor that can make you trip. What can I do in the bedroom?  Use night lights.  Make sure that you have a light by your bed that is easy to reach.  Do not use any sheets or blankets that are too big for your bed. They should not hang down onto the floor.  Have a firm chair that has side arms. You can use this for support while you get dressed.  Do not have throw rugs and other things on the floor that can make you trip. What can I do in the kitchen?  Clean up any spills right away.  Avoid walking on wet floors.  Keep items that you use a lot in easy-to-reach places.  If you need to reach something above you, use a strong step stool that has a grab bar.  Keep electrical cords out of the way.  Do not use  floor polish or wax that makes floors slippery. If you must use wax, use non-skid floor wax.  Do not have throw rugs and other things on the floor that can make you trip. What can I do with my stairs?  Do not leave any items on the stairs.  Make sure that there are handrails on both sides of the stairs and use them. Fix handrails that are broken or loose. Make sure that handrails are as long as the stairways.  Check any carpeting to make sure that it is firmly attached to the stairs. Fix any carpet that is loose or worn.  Avoid having throw rugs at the top or bottom of the stairs. If you do have throw rugs, attach them to the floor with carpet tape.  Make sure that you have a light switch at the top of the stairs and the bottom of the stairs. If you do not have them, ask someone to add them for you. What else can I do to help prevent falls?  Wear shoes that:  Do not have high heels.  Have rubber bottoms.  Are  comfortable and fit you well.  Are closed at the toe. Do not wear sandals.  If you use a stepladder:  Make sure that it is fully opened. Do not climb a closed stepladder.  Make sure that both sides of the stepladder are locked into place.  Ask someone to hold it for you, if possible.  Clearly mark and make sure that you can see:  Any grab bars or handrails.  First and last steps.  Where the edge of each step is.  Use tools that help you move around (mobility aids) if they are needed. These include:  Canes.  Walkers.  Scooters.  Crutches.  Turn on the lights when you go into a dark area. Replace any light bulbs as soon as they burn out.  Set up your furniture so you have a clear path. Avoid moving your furniture around.  If any of your floors are uneven, fix them.  If there are any pets around you, be aware of where they are.  Review your medicines with your doctor. Some medicines can make you feel dizzy. This can increase your chance of falling. Ask your doctor what other things that you can do to help prevent falls. This information is not intended to replace advice given to you by your health care provider. Make sure you discuss any questions you have with your health care provider. Document Released: 12/07/2008 Document Revised: 07/19/2015 Document Reviewed: 03/17/2014 Elsevier Interactive Patient Education  2017 [employer].

## 2020-06-29 ENCOUNTER — Telehealth: Payer: Self-pay

## 2020-06-29 NOTE — Telephone Encounter (Signed)
Spouse states that patient has seen dermatologist and that dermatologist suggest lowering gabapentin to 100mg  to more than 3 x day.    States dermatologist believes gabapentin could possibly be the cause of itching and wanted to try this.    Spouse states patient is up for a refill next week.  Patient uses [facility] on [address].   States dermatologist was to have reached out.  Please follow up in regard.

## 2020-06-29 NOTE — Telephone Encounter (Signed)
Appt scheduled

## 2020-06-29 NOTE — Telephone Encounter (Signed)
Let's decrease gabapentin accordingly:  Reduce gabapentin to 300 mg twice a day. Continue x 1 week.  Then reduced gabapentin to 300 mg once a day. Continue x 1 week.  Then stop medication completely since he feels like it is not helping his symptoms at all.

## 2020-07-02 ENCOUNTER — Telehealth (INDEPENDENT_AMBULATORY_CARE_PROVIDER_SITE_OTHER): Payer: PPO | Admitting: Physician Assistant

## 2020-07-02 ENCOUNTER — Encounter: Payer: Self-pay | Admitting: Physician Assistant

## 2020-07-02 DIAGNOSIS — R768 Other specified abnormal immunological findings in serum: Secondary | ICD-10-CM | POA: Diagnosis not present

## 2020-07-02 DIAGNOSIS — L299 Pruritus, unspecified: Secondary | ICD-10-CM

## 2020-07-02 DIAGNOSIS — L308 Other specified dermatitis: Secondary | ICD-10-CM | POA: Diagnosis not present

## 2020-07-02 DIAGNOSIS — M791 Myalgia, unspecified site: Secondary | ICD-10-CM

## 2020-07-02 MED ORDER — GABAPENTIN 300 MG PO CAPS: 300.0000 mg | ORAL_CAPSULE | Freq: Four times a day (QID) | ORAL | 1 refills | Status: DC

## 2020-07-02 NOTE — Progress Notes (Signed)
TELEPHONE ENCOUNTER   Patient verbally agreed to telephone visit and is aware that copayment and coinsurance may apply. Patient was treated using telemedicine according to accepted telemedicine protocols.  Location of the patient: home Location of provider: [place] Names of all persons participating in the telemedicine service and role in the encounter: Inda Coke, [place] , Ileana Roup  Subjective:   Chief Complaint  Patient presents with  . Medication Refill     HPI   Perivascular dermatitis Currently taking gabapentin 300 mg TID. Before each dose, feels like his dose is wearing off. He went to see Robyne Askew, PA in dermatology and was not given any answers about his skin. She recommended more frequent dosing of gabapentin to see if this would help him.  Of note, he saw Dr. Ellouise Newer in October of last year and had blood work done which showed positive ANA, 1:160; nuclear, speckled.  He does not have joint pain, but is experiencing upper arm and hand pain, in addition to this ongoing chronic diffuse rash.    Patient Active Problem List   Diagnosis Date Noted  . Chronic respiratory insufficiency 09/22/2019  . Sepsis ([place]) 09/14/2019  . Malnutrition of moderate degree 09/14/2019  . CAP (community acquired pneumonia) 09/11/2019  . Acute hypoxemic respiratory failure ([place]) 09/08/2019  . Chest tube in place   . Empyema ([place])   . Patient non-compliant, refuses any preventative care measures 04/04/2019  . Hypomagnesemia 03/10/2019  . Dysphagia 03/04/2019  . Moderate Neurocognitive deficits 03/04/2019  . Hypokalemia 02/24/2019  . Somnolence, daytime 02/24/2019  . Alcohol abuse with intoxication ([place]) 02/22/2019  . Frequent falls 02/22/2019  . Macrocytic anemia 02/22/2019  . Tobacco abuse 02/22/2019  . Physical deconditioning 02/22/2019  . Thrombocytopenia ([place]) 02/22/2019  . Weakness of right third cranial nerve 03/21/2016   Social History    Tobacco Use  . Smoking status: Current Every Day Smoker    Packs/day: 1.00  . Smokeless tobacco: Current User  Substance Use Topics  . Alcohol use: Not Currently    Current Outpatient Medications:  .  feeding supplement, ENSURE ENLIVE, (ENSURE ENLIVE) LIQD, Take 237 mLs by mouth 2 (two) times daily between meals., Disp: , Rfl:  .  gabapentin (NEURONTIN) 300 MG capsule, Take 1 capsule (300 mg total) by mouth 3 (three) times daily., Disp: 90 capsule, Rfl: 3 .  ibuprofen (ADVIL) 200 MG tablet, Take 600 mg by mouth every 6 (six) hours as needed for headache or moderate pain. , Disp: , Rfl:  Allergies  Allergen Reactions  . Penicillins Anaphylaxis    Has patient had a PCN reaction causing immediate rash, facial/tongue/throat swelling, SOB or lightheadedness with hypotension: Yes Has patient had a PCN reaction causing severe rash involving mucus membranes or skin necrosis: No Has patient had a PCN reaction that required hospitalization: Yes Has patient had a PCN reaction occurring within the last 10 years: No If all of the above answers are "NO", then may proceed with Cephalosporin use.   . Ativan [Lorazepam] Other (See Comments)    Made him crazy    Assessment & Plan:   1. Perivascular dermatitis; Chronic pruritus  Will increase gabapentin to 300 mg QID per patient request. Follow-up with rheumatology for further evaluation of symptoms.  2. Positive ANA (antinuclear antibody) Muscle pain    Suspicion for autoimmune component contributing to symptoms. Referral to rheumatology for further evaluation.       Orders Placed This Encounter  Procedures  .  Ambulatory referral to Rheumatology   No orders of the defined types were placed in this encounter.   Inda Coke, [place] 07/02/2020  Time spent with the patient: 8 minutes, spent in obtaining information about his symptoms, reviewing his previous labs, evaluations, and treatments, counseling him about his condition (please see  the discussed topics above), and developing a plan to further investigate it; he had a number of questions which I addressed.

## 2020-07-10 ENCOUNTER — Encounter: Payer: Self-pay | Admitting: Physician Assistant

## 2020-07-10 NOTE — Progress Notes (Signed)
   New Patient   Subjective  Edwin Torres is a 65 y.o. male who presents for the following: Skin Problem (Patient has Sebaceous cyst on left jaw line. X many years. Has grown over the years. 3 weeks ago patient was able to get stuff out of it. /Patient also has perivascular dermatitis and pcp did biopsy on arm  in November. Patient is taking gabapentin 300 mg x 3 times a day. It does not help very much per patient. He feels like he has fire ants crawling all over him. Also using triamcinolone cream. This has been going on at least a year.).   The following portions of the chart were reviewed this encounter and updated as appropriate:  Tobacco  Allergies  Meds  Problems  Med Hx  Surg Hx  Fam Hx      Objective  Well appearing patient in no apparent distress; mood and affect are within normal limits.  A focused examination was performed including arms, legs and face. Relevant physical exam findings are noted in the Assessment and Plan.  Objective  Left Forearm - Anterior, Left Lower Leg - Anterior, Right Forearm - Anterior, Right Lower Leg - Anterior: Superficial erythema   Objective  Left Anterior Mandible: Large nodule   Assessment & Plan  Perivascular dermatitis (4) Left Forearm - Anterior; Right Forearm - Anterior; Left Lower Leg - Anterior; Right Lower Leg - Anterior  observe  Sebaceous cyst Left Anterior Mandible  Schedule 30 minute surgery     I, Letonya Mangels, PA-C, have reviewed all documentation for this visit. The documentation on 07/10/20 for the exam, diagnosis, procedures, and orders are all accurate and complete.

## 2020-07-18 ENCOUNTER — Encounter: Payer: Self-pay | Admitting: Physician Assistant

## 2020-07-18 ENCOUNTER — Ambulatory Visit: Payer: PPO | Admitting: Neurology

## 2020-07-18 ENCOUNTER — Ambulatory Visit (INDEPENDENT_AMBULATORY_CARE_PROVIDER_SITE_OTHER): Payer: PPO | Admitting: Physician Assistant

## 2020-07-18 ENCOUNTER — Other Ambulatory Visit: Payer: Self-pay

## 2020-07-18 DIAGNOSIS — L720 Epidermal cyst: Secondary | ICD-10-CM | POA: Diagnosis not present

## 2020-07-18 DIAGNOSIS — D485 Neoplasm of uncertain behavior of skin: Secondary | ICD-10-CM

## 2020-07-18 NOTE — Patient Instructions (Signed)

## 2020-07-18 NOTE — Progress Notes (Addendum)
   Follow-Up Visit   Subjective  Edwin Torres is a 65 y.o. male who presents for the following: Cyst (Cyst on face left jawl seen at 4/21 visit patient stated its draining and stinks). He has had it for years.   The following portions of the chart were reviewed this encounter and updated as appropriate:   Tobacco  Allergies  Meds  Problems  Med Hx  Surg Hx  Fam Hx      Objective  Well appearing patient in no apparent distress; mood and affect are within normal limits.  A focused examination was performed including face.. Relevant physical exam findings are noted in the Assessment and Plan.  Objective  Left Anterior Mandible: Large, mobile nodule   Assessment & Plan  Neoplasm of uncertain behavior of skin Left Anterior Mandible  Skin excision  Total excision diameter (cm):  3 Informed consent: discussed and consent obtained   Timeout: patient name, date of birth, surgical site, and procedure verified   Anesthesia: the lesion was anesthetized in a standard fashion   Anesthetic:  1% lidocaine w/ epinephrine 1-100,000 local infiltration Instrument used: #15 blade   Hemostasis achieved with: pressure and electrodesiccation   Outcome: patient tolerated procedure well with no complications   Post-procedure details: sterile dressing applied and wound care instructions given   Dressing type: bandage, petrolatum and pressure dressing    Skin repair Complexity:  Simple Final length (cm):  3 Informed consent: discussed and consent obtained   Timeout: patient name, date of birth, surgical site, and procedure verified   Procedure prep:  Patient was prepped and draped in usual sterile fashion Prep type:  Chlorhexidine Anesthesia: the lesion was anesthetized in a standard fashion   Undermining: edges could be approximated without difficulty   Fine/surface layer approximation (top stitches):  Suture size:  4-0 Suture type: nylon   Suture removal (days):  7 Hemostasis  achieved with: suture Outcome: patient tolerated procedure well with no complications   Post-procedure details: wound care instructions given    Specimen 1 - Surgical pathology Differential Diagnosis: R/O Cyst  Check Margins: No   I, Krimson Massmann, PA-C, have reviewed all documentation's for this visit.  The documentation on 09/19/20 for the exam, diagnosis, procedures and orders are all accurate and complete.

## 2020-07-19 ENCOUNTER — Encounter: Payer: PPO | Admitting: Physician Assistant

## 2020-07-26 ENCOUNTER — Ambulatory Visit (INDEPENDENT_AMBULATORY_CARE_PROVIDER_SITE_OTHER): Payer: PPO

## 2020-07-26 ENCOUNTER — Other Ambulatory Visit: Payer: Self-pay

## 2020-07-26 DIAGNOSIS — Z4802 Encounter for removal of sutures: Secondary | ICD-10-CM

## 2020-07-26 NOTE — Progress Notes (Signed)
NTS Suture removal, No s/s of infection, patient's path report not back yet.

## 2020-08-02 NOTE — Addendum Note (Signed)
Addended by: Robyne Askew R on: 08/02/2020 12:59 PM   Modules accepted: Orders

## 2020-08-16 ENCOUNTER — Other Ambulatory Visit: Payer: Self-pay

## 2020-08-16 ENCOUNTER — Encounter: Payer: Self-pay | Admitting: Internal Medicine

## 2020-08-16 ENCOUNTER — Ambulatory Visit (INDEPENDENT_AMBULATORY_CARE_PROVIDER_SITE_OTHER): Payer: PPO | Admitting: Internal Medicine

## 2020-08-16 VITALS — BP 154/82 | HR 73 | Ht 62.0 in | Wt 150.8 lb

## 2020-08-16 DIAGNOSIS — M79644 Pain in right finger(s): Secondary | ICD-10-CM | POA: Diagnosis not present

## 2020-08-16 DIAGNOSIS — R768 Other specified abnormal immunological findings in serum: Secondary | ICD-10-CM | POA: Insufficient documentation

## 2020-08-16 DIAGNOSIS — L308 Other specified dermatitis: Secondary | ICD-10-CM | POA: Diagnosis not present

## 2020-08-16 DIAGNOSIS — M79645 Pain in left finger(s): Secondary | ICD-10-CM | POA: Diagnosis not present

## 2020-08-16 NOTE — Progress Notes (Signed)
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 Patient also complains of bilateral hand and bilateral shoulder pain and generalized joint stiffness. )   History of Present Illness: Edwin Torres is a 65 y.o. male here for evaluation of perivascular dermatitis rashes with positive ANA. Symptoms started since approximately a year and a half ago. He does not recall exact onset, he discontinued alcohol use around that time and is not sure whether symptoms preceded this but were masked, or started afterwards. He describes itching sensation and discomfort of all extremities this is continuously present. He tried some topical treatments without relief and antihistamines without relief. Since around 8 months ago he start taking gabapentin and has titrated up this medicine to 300 mg 4 times daily as needed. He feels this improves symptoms but does not resolve it. He has noticed some small red spots on the skin most pronounced on his abdomen and legs but itches in other areas as well. Dermatology biopsy of his distal arm demonstrated perivascular dermatitis but no specific new treatments for this were recommended at the time.   Labs reviewed ANA 1:160 speckled   Activities of Daily Living:  Patient reports morning stiffness for 24 hours.   Patient Denies nocturnal pain.  Difficulty dressing/grooming: Denies Difficulty climbing stairs: Reports Difficulty getting out of chair: Reports Difficulty using hands for taps, buttons, cutlery, and/or writing: Denies  Review of Systems  Constitutional:  Negative for fatigue.  HENT:  Negative for mouth sores, mouth dryness and nose dryness.   Eyes:  Positive for  itching and visual disturbance. Negative for pain and dryness.  Respiratory:  Negative for cough, hemoptysis, shortness of breath and difficulty breathing.   Cardiovascular:  Negative for chest pain, palpitations and swelling in legs/feet.  Gastrointestinal:  Negative for abdominal pain, blood in stool, constipation and diarrhea.  Endocrine: Negative for increased urination.  Genitourinary:  Negative for painful urination.  Musculoskeletal:  Positive for joint pain, joint pain and morning stiffness. Negative for joint swelling, myalgias, muscle weakness, muscle tenderness and myalgias.  Skin:  Positive for rash. Negative for color change, pallor, hair loss, nodules/bumps, redness, skin tightness, ulcers and sensitivity to sunlight.  Allergic/Immunologic: Negative for susceptible to infections.  Neurological:  Positive for numbness. Negative for dizziness, headaches, memory loss and weakness.  Hematological:  Negative for swollen glands.  Psychiatric/Behavioral:  Positive for sleep disturbance. Negative for confusion.    PMFS History:  Patient Active Problem List   Diagnosis Date Noted   Positive ANA (antinuclear antibody) 08/16/2020   Perivascular dermatitis 08/16/2020   Bilateral thumb pain 08/16/2020   Chronic respiratory insufficiency 09/22/2019   Sepsis ([place]) 09/14/2019   Malnutrition of moderate degree 09/14/2019   CAP (community acquired pneumonia) 09/11/2019   Acute hypoxemic respiratory failure ([place]) 09/08/2019   Chest tube in place    Empyema [facility])    Patient non-compliant, refuses any preventative care measures 04/04/2019   Hypomagnesemia 03/10/2019   Dysphagia 03/04/2019   Moderate Neurocognitive deficits 03/04/2019   Hypokalemia 02/24/2019   Somnolence, daytime 02/24/2019   Alcohol abuse with  intoxication ([place]) 02/22/2019   Frequent falls 02/22/2019   Macrocytic anemia 02/22/2019   Tobacco abuse 02/22/2019   Physical deconditioning 02/22/2019   Thrombocytopenia ([place])  02/22/2019   Weakness of right third cranial nerve 03/21/2016    Past Medical History:  Diagnosis Date   Alcohol addiction ([place])    Allergy    Cancer ([place])    Melanoma on face was removed   Depression    Herpes genitalis in men    History of chicken pox    Seizures ([place])     Family History  Problem Relation Age of Onset   Cancer Mother        bladder   Diabetes Mother    Arthritis Mother    Alcohol abuse Father    Arthritis Sister    Past Surgical History:  Procedure Laterality Date   FOOT FRACTURE SURGERY Bilateral    NOSE SURGERY     VASECTOMY     Social History   Social History Narrative   2012 fell from roof, disabled since   Right handed    Lives with wife   Immunization History  Administered Date(s) Administered   Pneumococcal Conjugate-13 04/04/2019   Tdap 07/11/2016, 02/22/2019     Objective: Vital Signs: BP (!) 154/82 (BP Location: Right Arm, Patient Position: Sitting, Cuff Size: Normal)   Pulse 73   Ht 5\' 2"  (1.575 m)   Wt 150 lb 12.8 oz (68.4 kg)   BMI 27.58 kg/m    Physical Exam HENT:     Right Ear: External ear normal.     Left Ear: External ear normal.     Mouth/Throat:     Mouth: Mucous membranes are moist.     Pharynx: Oropharynx is clear.  Cardiovascular:     Rate and Rhythm: Normal rate and regular rhythm.  Pulmonary:     Effort: Pulmonary effort is normal.     Breath sounds: Normal breath sounds.  Skin:    General: Skin is warm and dry.     Comments: Forearms with irregular patches of hypopigmentation and hyperpigmentation bilaterally Petechial rash and faint erythema on abdominal wall Irregular flat hypopigmented areas on bilateral knees Distal legs with solar lentigines also petechial rashes, no peripheral edema  Neurological:     Mental Status: He is alert.  Psychiatric:        Mood and Affect: Mood normal.     Musculoskeletal Exam:  Shoulders full ROM no tenderness or swelling Elbows full ROM no tenderness or  swelling Wrists full ROM no tenderness or swelling Fingers full ROM, mild tenderness of right 1st MCP joint without synovitis Knees full ROM no tenderness or swelling Ankles full ROM no tenderness or swelling  Investigation: No additional findings.  Imaging: No results found.  Recent Labs: Lab Results  Component Value Date   WBC 10.4 10/03/2019   HGB 8.8 (L) 10/03/2019   PLT 788 (H) 10/03/2019   NA 138 10/03/2019   K 4.3 10/03/2019   CL 104 10/03/2019   CO2 24 10/03/2019   GLUCOSE 82 10/03/2019   BUN 5 (L) 10/03/2019   CREATININE 0.49 (L) 10/03/2019   BILITOT 0.3 10/03/2019   ALKPHOS 75 09/22/2019   AST 12 10/03/2019   ALT 10 10/03/2019   PROT 6.3 10/03/2019   ALBUMIN 2.5 (L) 09/22/2019   CALCIUM 9.0 10/03/2019   GFRAA >60 09/22/2019    Speciality Comments: No specialty comments available.  Procedures:  No procedures performed Allergies: Penicillins and Ativan [lorazepam]  Assessment / Plan:     Visit Diagnoses: Positive ANA (antinuclear antibody) - Plan: RNP Antibody, Anti-Smith antibody, Sjogrens syndrome-A extractable nuclear antibody, Sjogrens syndrome-B extractable nuclear antibody, Anti-DNA antibody, double-stranded, C3 and C4  Positive ANA at moderate titer he has skin rashes no other specific clinical features for autoimmune disease.  We will check additional ENA markers today but no particularly strong clinical findings.  Perivascular dermatitis - Plan: Hepatitis C RNA quantitative, RPR, IgG, IgA, IgM  Perivascular dermatitis can be associated with multiple underlying systemic autoimmune disease.  In addition to ANA related test above we will also screen for HCV, RPR, quantitative immunoglobulins.  Bilateral thumb pain  Bilateral thumb pain looks consistent with first CMC and MCP joint osteoarthritis with slight joint subluxation and no synovitis on exam.  Orders: Orders Placed This Encounter  Procedures   RNP Antibody   Anti-Smith antibody    Sjogrens syndrome-A extractable nuclear antibody   Sjogrens syndrome-B extractable nuclear antibody   Anti-DNA antibody, double-stranded   C3 and C4   Hepatitis C RNA quantitative   RPR   IgG, IgA, IgM    No orders of the defined types were placed in this encounter.    Follow-Up Instructions: Return in about 3 weeks (around 09/06/2020) for New pt skin rashes and positive ANA 2-3wks.   Collier Salina, MD  Note - This record has been created using [employer].  Chart creation errors have been sought, but may not always  have been located. Such creation errors do not reflect on  the standard of medical care.

## 2020-08-18 LAB — ANTI-DNA ANTIBODY, DOUBLE-STRANDED: ds DNA Ab: 2 IU/mL

## 2020-08-18 LAB — RNP ANTIBODY: Ribonucleic Protein(ENA) Antibody, IgG: <1 AI

## 2020-08-18 LAB — C3 AND C4
C3 Complement: 106 mg/dL (ref 82–185)
C4 Complement: 22 mg/dL (ref 15–53)

## 2020-08-18 LAB — SJOGRENS SYNDROME-A EXTRACTABLE NUCLEAR ANTIBODY: SSA (Ro) (ENA) Antibody, IgG: <1 AI

## 2020-08-18 LAB — SJOGRENS SYNDROME-B EXTRACTABLE NUCLEAR ANTIBODY: SSB (La) (ENA) Antibody, IgG: <1 AI

## 2020-08-18 LAB — IGG, IGA, IGM
IgG (Immunoglobin G), Serum: 813 mg/dL (ref 600–1540)
IgM, Serum: 67 mg/dL (ref 50–300)
Immunoglobulin A: 391 mg/dL — ABNORMAL HIGH (ref 70–320)

## 2020-08-18 LAB — ANTI-SMITH ANTIBODY: ENA SM Ab Ser-aCnc: <1 AI

## 2020-08-18 LAB — HEPATITIS C RNA QUANTITATIVE
HCV Quantitative Log: <1.18 log IU/mL
HCV RNA, PCR, QN: <15 IU/mL

## 2020-08-18 LAB — RPR: RPR Ser Ql: NONREACTIVE

## 2020-09-10 ENCOUNTER — Ambulatory Visit (INDEPENDENT_AMBULATORY_CARE_PROVIDER_SITE_OTHER): Payer: PPO | Admitting: Internal Medicine

## 2020-09-10 ENCOUNTER — Other Ambulatory Visit: Payer: Self-pay

## 2020-09-10 ENCOUNTER — Encounter: Payer: Self-pay | Admitting: Internal Medicine

## 2020-09-10 VITALS — BP 137/83 | HR 98 | Ht 61.0 in | Wt 147.2 lb

## 2020-09-10 DIAGNOSIS — R768 Other specified abnormal immunological findings in serum: Secondary | ICD-10-CM | POA: Diagnosis not present

## 2020-09-10 DIAGNOSIS — L308 Other specified dermatitis: Secondary | ICD-10-CM

## 2020-09-10 NOTE — Progress Notes (Signed)
Office Visit Note  Patient: Edwin Torres             Date of Birth: 1955-08-28           MRN: [id number]             PCP: Inda Coke, PA Referring: Inda Coke, PA Visit Date: 09/10/2020   Subjective:   History of Present Illness: Edwin Torres is a 65 y.o. male here for follow up for rashes with positive ANA and perivascular dermatitis. His lab testing at initial visit was negative for all specific ENA antibody tests. IgA was mildly elevated with normal other immunoglobulin titers.  Since her last visit his symptoms remain about the same.  Most extensive current area of rash is faint petechial distribution over the abdomen.  His itching symptoms are most severe on his extremities not well correlated with the area of skin change.  Previous HPI: 08/16/20 Edwin Torres is a 65 y.o. male here for evaluation of perivascular dermatitis rashes with positive ANA. Symptoms started since approximately a year and a half ago. He does not recall exact onset, he discontinued alcohol use around that time and is not sure whether symptoms preceded this but were masked, or started afterwards. He describes itching sensation and discomfort of all extremities this is continuously present. He tried some topical treatments without relief and antihistamines without relief. Since around 8 months ago he start taking gabapentin and has titrated up this medicine to 300 mg 4 times daily as needed. He feels this improves symptoms but does not resolve it. He has noticed some small red spots on the skin most pronounced on his abdomen and legs but itches in other areas as well. Dermatology biopsy of his distal arm demonstrated perivascular dermatitis but no specific new treatments for this were recommended at the time.   Labs reviewed ANA 1:160 speckled   Review of Systems  Constitutional:  Negative for fatigue.  HENT:  Negative for mouth sores, mouth dryness and nose dryness.   Eyes:  Positive for  itching. Negative for pain and dryness.  Respiratory:  Negative for shortness of breath and difficulty breathing.   Cardiovascular:  Negative for chest pain and palpitations.  Gastrointestinal:  Negative for blood in stool, constipation and diarrhea.  Endocrine: Negative for increased urination.  Genitourinary:  Negative for difficulty urinating.  Musculoskeletal:  Positive for joint pain, joint pain, myalgias, morning stiffness, muscle tenderness and myalgias. Negative for joint swelling.  Skin:  Negative for color change, rash and redness.  Allergic/Immunologic: Negative for susceptible to infections.  Neurological:  Positive for numbness. Negative for dizziness, headaches, memory loss and weakness.  Hematological:  Positive for bruising/bleeding tendency.  Psychiatric/Behavioral:  Negative for confusion.    PMFS History:  Patient Active Problem List   Diagnosis Date Noted   Positive ANA (antinuclear antibody) 08/16/2020   Perivascular dermatitis 08/16/2020   Bilateral thumb pain 08/16/2020   Chronic respiratory insufficiency 09/22/2019   Sepsis ([place]) 09/14/2019   Malnutrition of moderate degree 09/14/2019   CAP (community acquired pneumonia) 09/11/2019   Acute hypoxemic respiratory failure ([place]) 09/08/2019   Chest tube in place    Empyema [facility])    Patient non-compliant, refuses any preventative care measures 04/04/2019   Hypomagnesemia 03/10/2019   Dysphagia 03/04/2019   Moderate Neurocognitive deficits 03/04/2019   Hypokalemia 02/24/2019   Somnolence, daytime 02/24/2019   Alcohol abuse with intoxication ([place]) 02/22/2019   Frequent falls 02/22/2019   Macrocytic anemia 02/22/2019  Tobacco abuse 02/22/2019   Physical deconditioning 02/22/2019   Thrombocytopenia ([place]) 02/22/2019   Weakness of right third cranial nerve 03/21/2016    Past Medical History:  Diagnosis Date   Alcohol addiction ([place])    Allergy    Cancer ([place])    Melanoma on face was removed   Depression     Herpes genitalis in men    History of chicken pox    Seizures ([place])     Family History  Problem Relation Age of Onset   Cancer Mother        bladder   Diabetes Mother    Arthritis Mother    Alcohol abuse Father    Arthritis Sister    Past Surgical History:  Procedure Laterality Date   FOOT FRACTURE SURGERY Bilateral    NOSE SURGERY     VASECTOMY     Social History   Social History Narrative   2012 fell from roof, disabled since   Right handed    Lives with wife   Immunization History  Administered Date(s) Administered   Pneumococcal Conjugate-13 04/04/2019   Tdap 07/11/2016, 02/22/2019     Objective: Vital Signs: BP 137/83 (BP Location: Left Arm, Patient Position: Sitting, Cuff Size: Normal)   Pulse 98   Ht 5\' 1"  (1.549 m)   Wt 147 lb 3.2 oz (66.8 kg)   BMI 27.81 kg/m    Physical Exam Skin:    General: Skin is warm and dry.     Findings: Rash present.     Comments: Faint petechiae present across abdomen and distal lower extremities, irregular flat hypopigmented areas over bilateral knees and on extremity extensor surfaces, few scattered irregular bordered patches of erythema or ecchymoses without tenderness or pruritus     Musculoskeletal Exam:  Wrists full ROM no tenderness or swelling Fingers full ROM no tenderness or swelling Knees full ROM no tenderness or swelling Ankles full ROM no tenderness or swelling  Investigation: No additional findings.  Imaging: No results found.  Recent Labs: Lab Results  Component Value Date   WBC 10.4 10/03/2019   HGB 8.8 (L) 10/03/2019   PLT 788 (H) 10/03/2019   NA 138 10/03/2019   K 4.3 10/03/2019   CL 104 10/03/2019   CO2 24 10/03/2019   GLUCOSE 82 10/03/2019   BUN 5 (L) 10/03/2019   CREATININE 0.49 (L) 10/03/2019   BILITOT 0.3 10/03/2019   ALKPHOS 75 09/22/2019   AST 12 10/03/2019   ALT 10 10/03/2019   PROT 6.3 10/03/2019   ALBUMIN 2.5 (L) 09/22/2019   CALCIUM 9.0 10/03/2019   GFRAA >60 09/22/2019     Speciality Comments: No specialty comments available.  Procedures:  No procedures performed Allergies: Penicillins and Ativan [lorazepam]   Assessment / Plan:     Visit Diagnoses: Perivascular dermatitis Positive ANA (antinuclear antibody)  Positive ANA and a moderate titer but is negative for all specific extractable nuclear antigens. Transaminases and platelets have been obtained so no specific evidence for abnormal liver synthetic function but does have a clinical history of high risk. There is no purpura, no serology, no abnormal urinalysis or other labs supporting a systemic vasculitis as the underlying cause so not recommending any kind of DMARD treatment at this time . The distribution of the itching Is not well correlated with areas of skin involvement, so I question neuropathic process might benefit from neurology assessment.  Orders: No orders of the defined types were placed in this encounter.  No orders of the defined  types were placed in this encounter.    Follow-Up Instructions: No follow-ups on file.   Collier Salina, MD  Note - This record has been created using [employer].  Chart creation errors have been sought, but may not always  have been located. Such creation errors do not reflect on  the standard of medical care.

## 2020-09-19 NOTE — Addendum Note (Signed)
Addended by: Warren Danes on: 09/19/2020 08:38 AM   Modules accepted: Orders

## 2020-10-01 ENCOUNTER — Telehealth: Payer: Self-pay

## 2020-10-01 NOTE — Telephone Encounter (Signed)
Patient's wife [name] called stating Edwin Torres had an appointment with Dr. Benjamine Mola on 09/10/20 and was told he would receive a return call once he spoke with his dermatologist.  [name] states that he has yet to receive a return call.

## 2020-10-02 ENCOUNTER — Telehealth: Payer: Self-pay | Admitting: Physician Assistant

## 2020-10-02 ENCOUNTER — Telehealth: Payer: Self-pay | Admitting: Radiology

## 2020-10-02 NOTE — Telephone Encounter (Signed)
Edwin Torres with [facility] left message on office voice mail that Edwin Torres, M.D. sent a staff message to get more information about this mutual patient, but he has not heard anything back yet.  Dr. Benjamine Mola would like to speak with Edwin Askew, PA-C as soon as possible concerning this patient's status.  Dr. Marveen Reeks cell number is [phone].

## 2020-10-02 NOTE — Telephone Encounter (Signed)
LVM for [facility], office of [facility], explaining Dr. Benjamine Mola sent a staff message regarding mutual patient and care moving forward and he would like to speak with her directly regarding the patient- provided Dr. Marveen Reeks cell phone # for return call.

## 2020-10-03 NOTE — Telephone Encounter (Signed)
I received a call back form [facility] Torres discussion whether skin rashes or neurologic issue seems more consistent as the main cause of symptoms. She recommends can discuss question with Dr. Denna Haggard probably next week I will plan to do so.

## 2020-10-08 ENCOUNTER — Telehealth: Payer: Self-pay | Admitting: Physician Assistant

## 2020-10-08 NOTE — Telephone Encounter (Signed)
Patient calling and asking to speak to [facility]. He states that we referred him to rheumatologist. He states that the rheumatology office are telling him they have left VM's and we have not called them back. He states that he is itching all over and has bumps that Vida Roller has seen at previous visit. He would like for [facility] to call him back because he is in distress. I informed patient that I will send TE to [facility] but she will not be back in the office until tomorrow. Patient voiced understanding.

## 2020-10-09 ENCOUNTER — Telehealth: Payer: Self-pay | Admitting: Physician Assistant

## 2020-10-09 NOTE — Telephone Encounter (Signed)
Returned phone call. Patient is frustrated and in pain. Gabapentin is helping but he is never really comfortable. In consultation with Dr. Denna Haggard we will get him in and do two large punch biopsies due to the fact that his last biopsy was over a year ago.

## 2020-10-10 ENCOUNTER — Telehealth: Payer: Self-pay | Admitting: Physician Assistant

## 2020-10-16 ENCOUNTER — Encounter: Payer: Self-pay | Admitting: Physician Assistant

## 2020-10-16 ENCOUNTER — Ambulatory Visit (INDEPENDENT_AMBULATORY_CARE_PROVIDER_SITE_OTHER): Payer: PPO | Admitting: Physician Assistant

## 2020-10-16 ENCOUNTER — Other Ambulatory Visit: Payer: Self-pay

## 2020-10-16 DIAGNOSIS — Z1283 Encounter for screening for malignant neoplasm of skin: Secondary | ICD-10-CM | POA: Diagnosis not present

## 2020-10-16 DIAGNOSIS — D485 Neoplasm of uncertain behavior of skin: Secondary | ICD-10-CM

## 2020-10-16 DIAGNOSIS — L308 Other specified dermatitis: Secondary | ICD-10-CM | POA: Diagnosis not present

## 2020-10-16 NOTE — Patient Instructions (Signed)

## 2020-10-19 IMAGING — DX DG KNEE 1-2V*L*
1 series · 2 of 2 positions shown · non-contrast
Comparison: None.

CLINICAL DATA: Left elbow pain after fall.

EXAM:
LEFT KNEE - 1-2 VIEW

[Series 1: knee · 0.14mm/px · 2 of 2 slices shown]
[im 1/2]
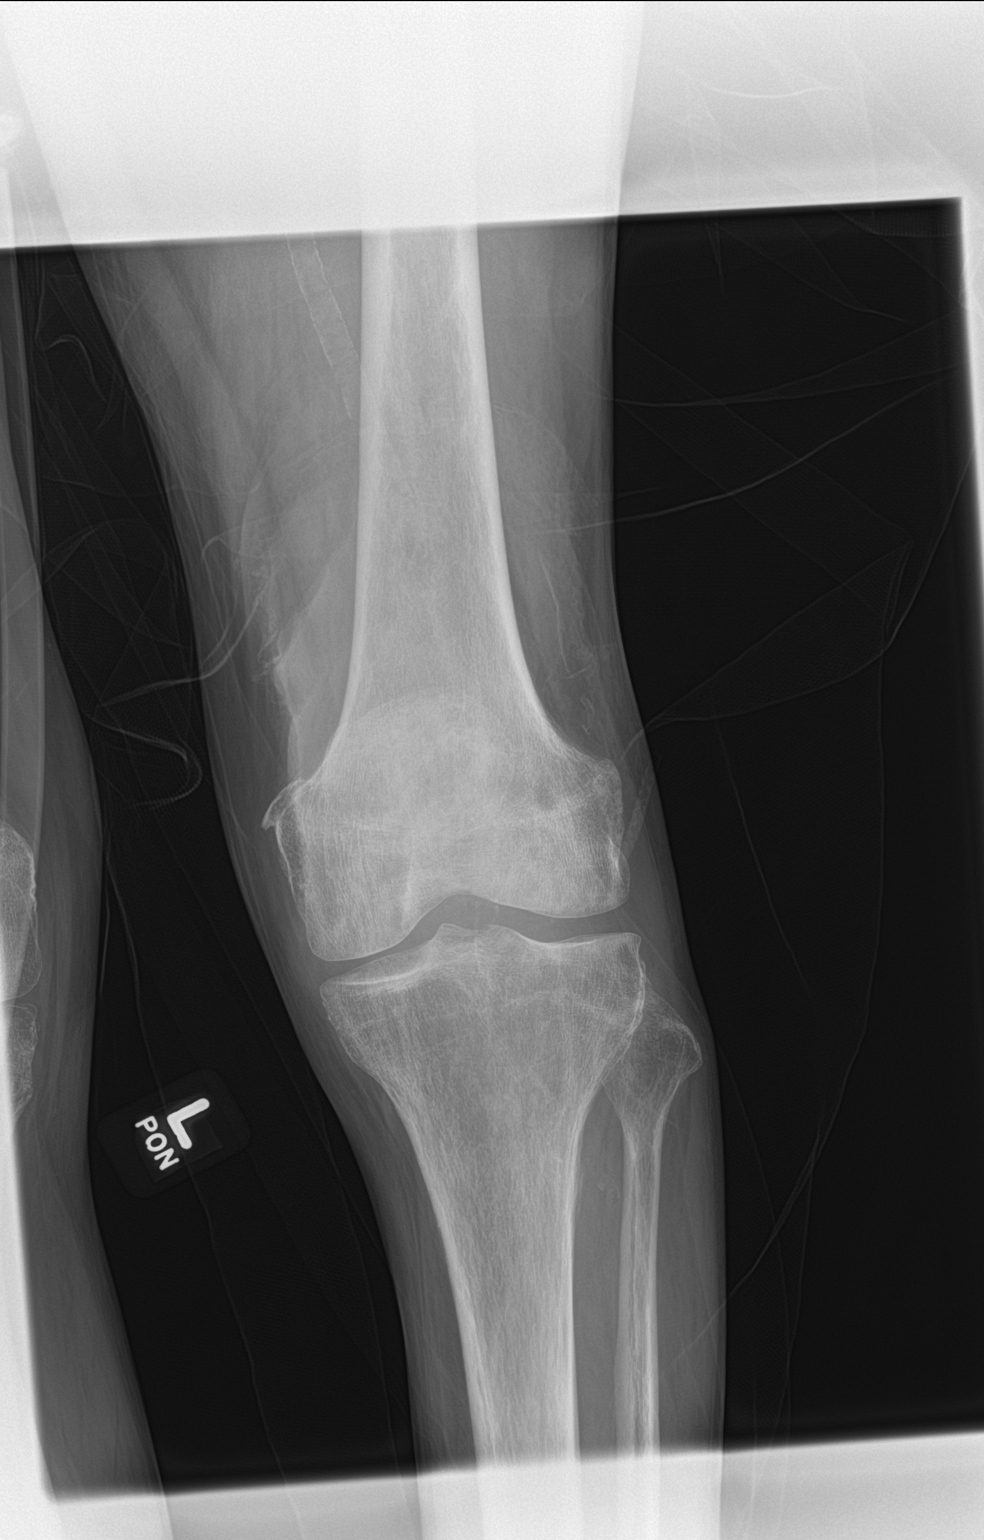
[im 2/2]
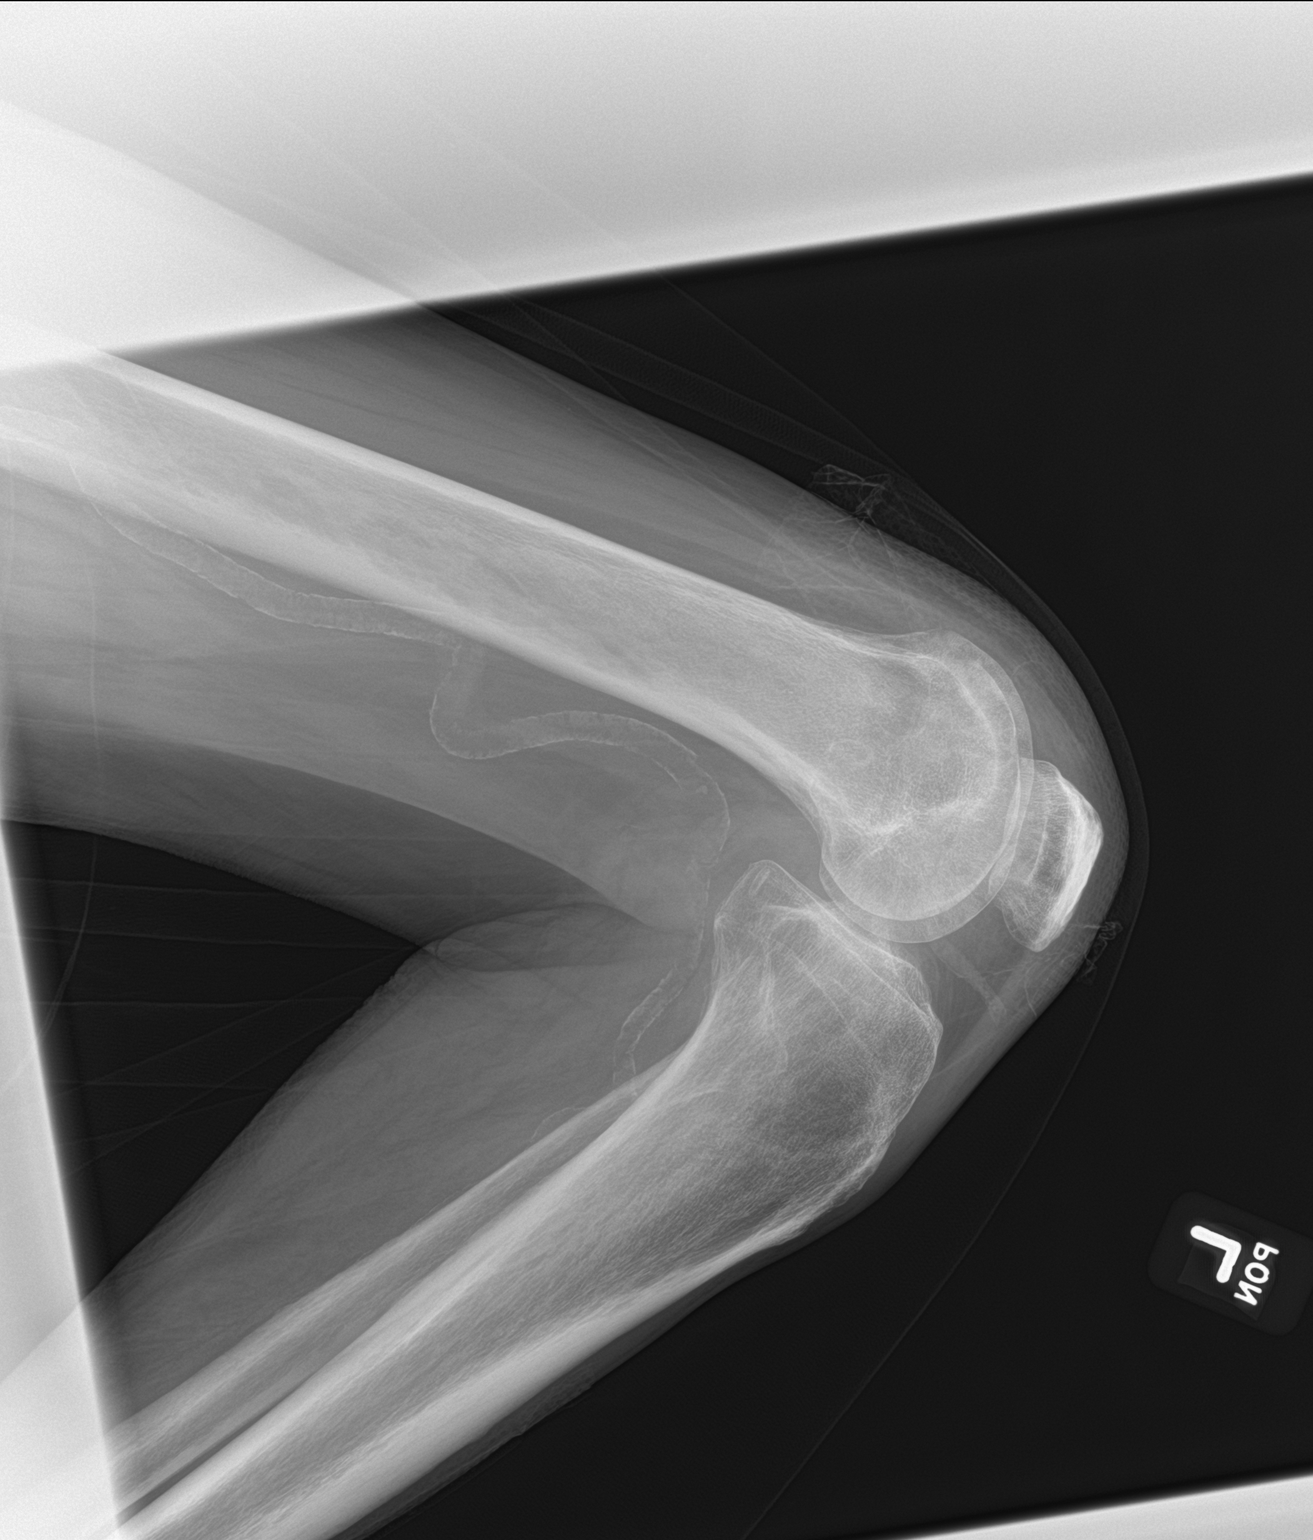

[2 of 2 positions shown; findings below may reference images not displayed]

FINDINGS: No evidence of fracture, dislocation, or joint effusion. No evidence
of arthropathy or other focal bone abnormality. Vascular
calcifications are noted.
IMPRESSION: No significant abnormality seen in the left knee.

## 2020-10-30 NOTE — Telephone Encounter (Signed)
I spoke with Edwin Torres regarding his burning and shock like sensations all over his body. The punch biopsies revealed no true skin condition. Pathologist indicated that is was more consistent with neurodermatitis or dysesthesia. I spoke with Dr. Denna Haggard regarding this diagnosis. He has already seen a neurologist without any answer to his condition. We will refer him to [facility]. He agreed to see Dr. Ronalee Red.

## 2020-10-31 ENCOUNTER — Telehealth: Payer: Self-pay | Admitting: *Deleted

## 2020-10-31 ENCOUNTER — Encounter: Payer: Self-pay | Admitting: Physician Assistant

## 2020-10-31 NOTE — Progress Notes (Signed)
Follow-Up Visit   Subjective  Edwin Torres is a 65 y.o. male who presents for the following: Skin Problem (Needs biopsy per Nikeshia Keetch ).   The following portions of the chart were reviewed this encounter and updated as appropriate:  Tobacco  Allergies  Meds  Problems  Med Hx  Surg Hx  Fam Hx      Objective  Well appearing patient in no apparent distress; mood and affect are within normal limits.  A full examination was performed including scalp, head, eyes, ears, nose, lips, neck, chest, axillae, abdomen, back, buttocks, bilateral upper extremities, bilateral lower extremities, hands, feet, fingers, toes, fingernails, and toenails. All findings within normal limits unless otherwise noted below.  Left Forearm - Posterior Hyperpigmentation with large areas of hypopigmentation     Left Abdomen (side) - Lower Small scattered  petechia     Right Abdomen (side) - Upper Small petechia scattered     Right Lower Leg - Anterior Diffuse actinic crusting with small areas of pink scale     Assessment & Plan  Neoplasm of uncertain behavior of skin (4) Left Forearm - Posterior  Skin / nail biopsy Type of biopsy: tangential   Informed consent: discussed and consent obtained   Timeout: patient name, date of birth, surgical site, and procedure verified   Anesthesia: the lesion was anesthetized in a standard fashion   Anesthetic:  1% lidocaine w/ epinephrine 1-100,000 local infiltration Instrument used: flexible razor blade   Hemostasis achieved with: ferric subsulfate   Outcome: patient tolerated procedure well   Post-procedure details: wound care instructions given    Specimen 1 - Surgical pathology Differential Diagnosis: previous biopsy was perivascular dermatitis- numerous presentations- hyperkeratosis with slight erythema and scale- extremities discoloration. He is experiencing burning pain. Neurodermatitis is a possibility.  Check Margins: No  Left Abdomen  (side) - Lower  Skin / nail biopsy Type of biopsy: tangential   Informed consent: discussed and consent obtained   Timeout: patient name, date of birth, surgical site, and procedure verified   Anesthesia: the lesion was anesthetized in a standard fashion   Anesthetic:  1% lidocaine w/ epinephrine 1-100,000 local infiltration Instrument used: flexible razor blade   Hemostasis achieved with: ferric subsulfate   Outcome: patient tolerated procedure well   Post-procedure details: wound care instructions given    Specimen 3 - Surgical pathology Differential Diagnosis: previous biopsy was perivascular dermatitis- numerous presentations- hyperkeratosis with slight erythema and scale- extremities discoloration. He is experiencing burning pain. Neurodermatitis is a possibility.  Check Margins: No  Right Abdomen (side) - Upper  Skin / nail biopsy Type of biopsy: tangential   Informed consent: discussed and consent obtained   Timeout: patient name, date of birth, surgical site, and procedure verified   Anesthesia: the lesion was anesthetized in a standard fashion   Anesthetic:  1% lidocaine w/ epinephrine 1-100,000 local infiltration Instrument used: flexible razor blade   Hemostasis achieved with: ferric subsulfate   Outcome: patient tolerated procedure well   Post-procedure details: wound care instructions given    Specimen 2 - Surgical pathology Differential Diagnosis: previous biopsy was perivascular dermatitis- numerous presentations- hyperkeratosis with slight erythema and scale- extremities discoloration. He is experiencing burning pain. Neurodermatitis is a possibility.  Check Margins: No  Right Lower Leg - Anterior  Skin / nail biopsy Type of biopsy: tangential   Informed consent: discussed and consent obtained   Timeout: patient name, date of birth, surgical site, and procedure verified   Anesthesia: the lesion  was anesthetized in a standard fashion   Anesthetic:  1%  lidocaine w/ epinephrine 1-100,000 local infiltration Instrument used: flexible razor blade   Hemostasis achieved with: ferric subsulfate   Outcome: patient tolerated procedure well   Post-procedure details: wound care instructions given    Specimen 4 - Surgical pathology Differential Diagnosis: previous biopsy was perivascular dermatitis- numerous presentations- hyperkeratosis with slight erythema and scale- extremities discoloration. He is experiencing burning pain. Neurodermatitis is a possibility.  Check Margins: No   I, Bijal Siglin, PA-C, have reviewed all documentation's for this visit.  The documentation on 10/31/20 for the exam, diagnosis, procedures and orders are all accurate and complete.

## 2020-10-31 NOTE — Telephone Encounter (Signed)
Phone call to [facility] to make patient appointment to see Dr.Huang. Patients demographics and info was faxed. Patient notified of appointment 03/05/21 '@2'$ :30.

## 2020-11-13 ENCOUNTER — Ambulatory Visit (INDEPENDENT_AMBULATORY_CARE_PROVIDER_SITE_OTHER): Payer: PPO | Admitting: Physician Assistant

## 2020-11-13 ENCOUNTER — Encounter: Payer: Self-pay | Admitting: Physician Assistant

## 2020-11-13 ENCOUNTER — Other Ambulatory Visit: Payer: Self-pay

## 2020-11-13 DIAGNOSIS — C44310 Basal cell carcinoma of skin of unspecified parts of face: Secondary | ICD-10-CM

## 2020-11-13 DIAGNOSIS — C44319 Basal cell carcinoma of skin of other parts of face: Secondary | ICD-10-CM | POA: Diagnosis not present

## 2020-11-13 DIAGNOSIS — D485 Neoplasm of uncertain behavior of skin: Secondary | ICD-10-CM

## 2020-11-13 MED ORDER — MUPIROCIN 2 % EX OINT: 1.0000 "application " | TOPICAL_OINTMENT | Freq: Every day | CUTANEOUS | 0 refills | Status: DC

## 2020-11-13 NOTE — Patient Instructions (Signed)

## 2020-11-13 NOTE — Progress Notes (Deleted)
   Follow-Up Visit   Subjective  Edwin Torres is a 65 y.o. male who presents for the following: Procedure (Cyst removal left side face. ). He grew a new spot on his face. It was crusted and bleeding a little bit. Persistent x at least 4 weeks. Will defer cyst surgery.   The following portions of the chart were reviewed this encounter and updated as appropriate:  Tobacco  Allergies  Meds  Problems  Med Hx  Surg Hx  Fam Hx      Objective  Well appearing patient in no apparent distress; mood and affect are within normal limits.  All skin waist up examined.  Right Malar Cheek Pearly papule with telangectasia.         Assessment & Plan  Neoplasm of uncertain behavior of skin Right Malar Cheek  Skin / nail biopsy Type of biopsy: tangential   Informed consent: discussed and consent obtained   Timeout: patient name, date of birth, surgical site, and procedure verified   Procedure prep:  Patient was prepped and draped in usual sterile fashion (Non sterile) Prep type:  Chlorhexidine Anesthesia: the lesion was anesthetized in a standard fashion   Anesthetic:  1% lidocaine w/ epinephrine 1-100,000 local infiltration Instrument used: flexible razor blade   Hemostasis achieved with: ferric subsulfate and electrodesiccation   Outcome: patient tolerated procedure well   Post-procedure details: sterile dressing applied and wound care instructions given   Dressing type: bandage and petrolatum    mupirocin ointment (BACTROBAN) 2 % Apply 1 application topically daily.  Destruction of lesion Complexity: simple   Destruction method: electrodesiccation and curettage   Informed consent: discussed and consent obtained   Timeout:  patient name, date of birth, surgical site, and procedure verified Anesthesia: the lesion was anesthetized in a standard fashion   Anesthetic:  1% lidocaine w/ epinephrine 1-100,000 local infiltration Curettage performed in three different directions:  Yes   Electrodesiccation performed over the curetted area: Yes   Curettage cycles:  1 Margin per side (cm):  0.1 Final wound size (cm):  0.6 Hemostasis achieved with:  aluminum chloride and electrodesiccation Outcome: patient tolerated procedure well with no complications   Post-procedure details: sterile dressing applied and wound care instructions given   Dressing type: bandage and petrolatum    Specimen 1 - Surgical pathology Differential Diagnosis: R/O BCC vs SCC - treated after biopsy  Check Margins: yes    I, Kshawn Canal, PA-C, have reviewed all documentation's for this visit.  The documentation on 11/13/20 for the exam, diagnosis, procedures and orders are all accurate and complete.

## 2020-11-14 NOTE — Progress Notes (Signed)
   Follow-Up Visit   Subjective  Edwin Torres is a 65 y.o. male who presents for the following: Procedure (Cyst removal left side face. ). New growth under his eye with bleeding and persistent 1-2 months.   The following portions of the chart were reviewed this encounter and updated as appropriate:  Tobacco  Allergies  Meds  Problems  Med Hx  Surg Hx  Fam Hx      Objective  Well appearing patient in no apparent distress; mood and affect are within normal limits.  A focused examination was performed including face. Relevant physical exam findings are noted in the Assessment and Plan.  Right Malar Cheek Pearly papule with telangectasia.        Assessment & Plan  Basal cell carcinoma (BCC) of skin of face, unspecified part of face Right Malar Cheek  Skin / nail biopsy Type of biopsy: tangential   Informed consent: discussed and consent obtained   Timeout: patient name, date of birth, surgical site, and procedure verified   Procedure prep:  Patient was prepped and draped in usual sterile fashion (Non sterile) Prep type:  Chlorhexidine Anesthesia: the lesion was anesthetized in a standard fashion   Anesthetic:  1% lidocaine w/ epinephrine 1-100,000 local infiltration Instrument used: flexible razor blade   Hemostasis achieved with: ferric subsulfate and electrodesiccation   Outcome: patient tolerated procedure well   Post-procedure details: sterile dressing applied and wound care instructions given   Dressing type: bandage and petrolatum    mupirocin ointment (BACTROBAN) 2 % Apply 1 application topically daily.  Destruction of lesion Complexity: simple   Destruction method: electrodesiccation and curettage   Informed consent: discussed and consent obtained   Timeout:  patient name, date of birth, surgical site, and procedure verified Anesthesia: the lesion was anesthetized in a standard fashion   Anesthetic:  1% lidocaine w/ epinephrine 1-100,000 local  infiltration Curettage performed in three different directions: Yes   Electrodesiccation performed over the curetted area: Yes   Curettage cycles:  1 Margin per side (cm):  0.1 Final wound size (cm):  0.6 Hemostasis achieved with:  aluminum chloride and electrodesiccation Outcome: patient tolerated procedure well with no complications   Post-procedure details: sterile dressing applied and wound care instructions given   Dressing type: bandage and petrolatum    Specimen 1 - Surgical pathology Differential Diagnosis: R/O BCC vs SCC - treated after biopsy  Check Margins: yes   I, Judas Mohammad, PA-C, have reviewed all documentation's for this visit.  The documentation on 11/20/20 for the exam, diagnosis, procedures and orders are all accurate and complete.

## 2020-11-21 ENCOUNTER — Telehealth: Payer: Self-pay | Admitting: *Deleted

## 2020-11-21 NOTE — Telephone Encounter (Signed)
Left message for patient to return our phone call for resutls.

## 2020-11-21 NOTE — Telephone Encounter (Signed)
Path to patient  

## 2020-11-21 NOTE — Telephone Encounter (Signed)
-----   Message from Warren Danes, [place] sent at 11/20/2020  4:46 PM EDT ----- Mohs if recurs

## 2020-11-21 NOTE — Telephone Encounter (Signed)
Left message for patient to return our phone call for results.

## 2020-11-21 NOTE — Telephone Encounter (Addendum)
-----   Message from Warren Danes, [place] sent at 11/20/2020  4:46 PM EDT ----- Mohs if recurs

## 2020-12-13 ENCOUNTER — Ambulatory Visit: Payer: PPO | Admitting: Physician Assistant

## 2020-12-20 ENCOUNTER — Ambulatory Visit (INDEPENDENT_AMBULATORY_CARE_PROVIDER_SITE_OTHER): Payer: PPO | Admitting: Physician Assistant

## 2020-12-20 ENCOUNTER — Other Ambulatory Visit: Payer: Self-pay

## 2020-12-20 DIAGNOSIS — L720 Epidermal cyst: Secondary | ICD-10-CM

## 2020-12-20 DIAGNOSIS — D485 Neoplasm of uncertain behavior of skin: Secondary | ICD-10-CM

## 2020-12-20 NOTE — Patient Instructions (Signed)

## 2020-12-26 ENCOUNTER — Encounter: Payer: Self-pay | Admitting: Physician Assistant

## 2020-12-26 NOTE — Progress Notes (Signed)
   Follow-Up Visit   Subjective  Edwin Torres is a 65 y.o. male who presents for the following: Skin Problem (Here for punch biopsy on left side face. ).   The following portions of the chart were reviewed this encounter and updated as appropriate:  Tobacco  Allergies  Meds  Problems  Med Hx  Surg Hx  Fam Hx      Objective  Well appearing patient in no apparent distress; mood and affect are within normal limits.  A focused examination was performed including face. Relevant physical exam findings are noted in the Assessment and Plan.  Right Frontal Scalp Thick plaque with enlarged pore in the center.     Left Anterior Mandible Scarred in nodule.     Assessment & Plan  Neoplasm of uncertain behavior of skin (2) Right Frontal Scalp  Skin / nail biopsy Type of biopsy: punch   Informed consent: discussed and consent obtained   Timeout: patient name, date of birth, surgical site, and procedure verified   Procedure prep:  Patient was prepped and draped in usual sterile fashion (Non sterile) Prep type:  Chlorhexidine Anesthesia: the lesion was anesthetized in a standard fashion   Anesthetic:  1% lidocaine w/ epinephrine 1-100,000 local infiltration Punch size:  5 mm Suture size:  5-0 Suture type: nylon   Suture removal (days):  7 Hemostasis achieved with: suture and pressure   Outcome: patient tolerated procedure well   Post-procedure details: wound care instructions given    Specimen 1 - Surgical pathology Differential Diagnosis: cyst  Check Margins: No  Left Anterior Mandible  Skin / nail biopsy Type of biopsy: punch   Informed consent: discussed and consent obtained   Timeout: patient name, date of birth, surgical site, and procedure verified   Procedure prep:  Patient was prepped and draped in usual sterile fashion (Non sterile) Prep type:  Chlorhexidine Anesthesia: the lesion was anesthetized in a standard fashion   Anesthetic:  1% lidocaine w/  epinephrine 1-100,000 local infiltration Punch size:  8 mm Suture size:  5-0 Suture type: nylon   Suture removal (days):  7 Hemostasis achieved with: suture and pressure   Outcome: patient tolerated procedure well   Post-procedure details: wound care instructions given    Specimen 2 - Surgical pathology Differential Diagnosis: cyst  Check Margins: No   I, Jalyn Rosero, PA-C, have reviewed all documentation's for this visit.  The documentation on 12/26/20 for the exam, diagnosis, procedures and orders are all accurate and complete.

## 2020-12-27 ENCOUNTER — Ambulatory Visit (INDEPENDENT_AMBULATORY_CARE_PROVIDER_SITE_OTHER): Payer: PPO | Admitting: *Deleted

## 2020-12-27 ENCOUNTER — Other Ambulatory Visit: Payer: Self-pay

## 2020-12-27 DIAGNOSIS — Z4802 Encounter for removal of sutures: Secondary | ICD-10-CM

## 2020-12-27 NOTE — Progress Notes (Signed)
Here for NTS suture removal. No signs or symptoms of infection. Path to patient. 

## 2021-01-06 ENCOUNTER — Other Ambulatory Visit: Payer: Self-pay | Admitting: Physician Assistant

## 2021-03-19 ENCOUNTER — Telehealth: Payer: Self-pay | Admitting: Physician Assistant

## 2021-03-19 NOTE — Telephone Encounter (Signed)
Wants to talk to [facility]. She referred him to a dermatologist in [place], Dr Ronalee Red(?) , and he wants to follow up with her on the visit he had there. Said she told him to call her + let her know.

## 2021-03-19 NOTE — Telephone Encounter (Signed)
Phone call back to patient to see how his appointment with [facility] went. He said not much was done. They did give him pregabalin (lyrica) but he took it for one day and said it caused him some burning and pain. Will forward Leandrew Koyanagi this message for patient.

## 2021-03-26 ENCOUNTER — Other Ambulatory Visit: Payer: Self-pay

## 2021-03-26 ENCOUNTER — Ambulatory Visit (INDEPENDENT_AMBULATORY_CARE_PROVIDER_SITE_OTHER): Payer: PPO | Admitting: Physician Assistant

## 2021-03-26 ENCOUNTER — Encounter: Payer: Self-pay | Admitting: Physician Assistant

## 2021-03-26 VITALS — BP 102/70 | HR 61 | Temp 98.8°F | Ht 61.0 in | Wt 153.2 lb

## 2021-03-26 DIAGNOSIS — Z13220 Encounter for screening for lipoid disorders: Secondary | ICD-10-CM

## 2021-03-26 DIAGNOSIS — H5789 Other specified disorders of eye and adnexa: Secondary | ICD-10-CM | POA: Diagnosis not present

## 2021-03-26 DIAGNOSIS — Z720 Tobacco use: Secondary | ICD-10-CM

## 2021-03-26 DIAGNOSIS — Z0001 Encounter for general adult medical examination with abnormal findings: Secondary | ICD-10-CM

## 2021-03-26 DIAGNOSIS — Z23 Encounter for immunization: Secondary | ICD-10-CM | POA: Diagnosis not present

## 2021-03-26 DIAGNOSIS — Z125 Encounter for screening for malignant neoplasm of prostate: Secondary | ICD-10-CM | POA: Diagnosis not present

## 2021-03-26 DIAGNOSIS — Z1159 Encounter for screening for other viral diseases: Secondary | ICD-10-CM

## 2021-03-26 DIAGNOSIS — L299 Pruritus, unspecified: Secondary | ICD-10-CM | POA: Diagnosis not present

## 2021-03-26 DIAGNOSIS — Z0000 Encounter for general adult medical examination without abnormal findings: Secondary | ICD-10-CM

## 2021-03-26 DIAGNOSIS — Z136 Encounter for screening for cardiovascular disorders: Secondary | ICD-10-CM

## 2021-03-26 DIAGNOSIS — Z1211 Encounter for screening for malignant neoplasm of colon: Secondary | ICD-10-CM

## 2021-03-26 DIAGNOSIS — Z0100 Encounter for examination of eyes and vision without abnormal findings: Secondary | ICD-10-CM

## 2021-03-26 LAB — COMPREHENSIVE METABOLIC PANEL
ALT: 9 U/L (ref 0–53)
AST: 14 U/L (ref 0–37)
Albumin: 4.2 g/dL (ref 3.5–5.2)
Alkaline Phosphatase: 107 U/L (ref 39–117)
BUN: 4 mg/dL — ABNORMAL LOW (ref 6–23)
CO2: 27 mEq/L (ref 19–32)
Calcium: 9.3 mg/dL (ref 8.4–10.5)
Chloride: 98 mEq/L (ref 96–112)
Creatinine, Ser: 0.74 mg/dL (ref 0.40–1.50)
GFR: 94.1 mL/min (ref >60.00)
Glucose, Bld: 80 mg/dL (ref 70–99)
Potassium: 4.4 mEq/L (ref 3.5–5.1)
Sodium: 132 mEq/L — ABNORMAL LOW (ref 135–145)
Total Bilirubin: 0.6 mg/dL (ref 0.2–1.2)
Total Protein: 6.6 g/dL (ref 6.0–8.3)

## 2021-03-26 LAB — CBC WITH DIFFERENTIAL/PLATELET
Basophils Absolute: 0.1 10*3/uL (ref 0.0–0.1)
Basophils Relative: 0.6 % (ref 0.0–3.0)
Eosinophils Absolute: 0.3 10*3/uL (ref 0.0–0.7)
Eosinophils Relative: 3.1 % (ref 0.0–5.0)
HCT: 46.4 % (ref 39.0–52.0)
Hemoglobin: 15.6 g/dL (ref 13.0–17.0)
Lymphocytes Relative: 44.9 % (ref 12.0–46.0)
Lymphs Abs: 3.8 10*3/uL (ref 0.7–4.0)
MCHC: 33.6 g/dL (ref 30.0–36.0)
MCV: 95.9 fl (ref 78.0–100.0)
Monocytes Absolute: 0.8 10*3/uL (ref 0.1–1.0)
Monocytes Relative: 9.6 % (ref 3.0–12.0)
Neutro Abs: 3.5 10*3/uL (ref 1.4–7.7)
Neutrophils Relative %: 41.8 % — ABNORMAL LOW (ref 43.0–77.0)
Platelets: 284 10*3/uL (ref 150.0–400.0)
RBC: 4.84 Mil/uL (ref 4.22–5.81)
RDW: 13 % (ref 11.5–15.5)
WBC: 8.5 10*3/uL (ref 4.0–10.5)

## 2021-03-26 LAB — LIPID PANEL
Cholesterol: 151 mg/dL (ref 0–200)
HDL: 30.9 mg/dL — ABNORMAL LOW (ref >39.00)
LDL Cholesterol: 89 mg/dL (ref 0–99)
NonHDL: 119.6
Total CHOL/HDL Ratio: 5
Triglycerides: 154 mg/dL — ABNORMAL HIGH (ref 0.0–149.0)
VLDL: 30.8 mg/dL (ref 0.0–40.0)

## 2021-03-26 LAB — PSA: PSA: 2.92 ng/mL (ref 0.10–4.00)

## 2021-03-26 MED ORDER — OLOPATADINE HCL 0.1 % OP SOLN: 1.0000 [drp] | Freq: Two times a day (BID) | OPHTHALMIC | 12 refills | Status: DC

## 2021-03-26 MED ORDER — GABAPENTIN 400 MG PO CAPS: 400.0000 mg | ORAL_CAPSULE | Freq: Four times a day (QID) | ORAL | 1 refills | Status: DC

## 2021-03-26 NOTE — Patient Instructions (Addendum)
It was great to see you!  Increase gabapentin to 400 mg four times daily  I have sent in your eye drops to trial, I would still like for you to go to the eye doctor  We are updating your pneumonia vaccine today  Cologuard will be sent to your home  Please go to the lab for blood work.   Our office will call you with your results unless you have chosen to receive results via MyChart.  If your blood work is normal we will follow-up each year for physicals and as scheduled for chronic medical problems.  If anything is abnormal we will treat accordingly and get you in for a follow-up.  Take care,  Aldona Bar

## 2021-03-26 NOTE — Addendum Note (Signed)
Addended by: Erlene Quan on: 03/26/2021 10:46 AM   Modules accepted: Orders

## 2021-03-26 NOTE — Progress Notes (Signed)
Subjective:    Edwin Torres is a 66 y.o. male and is here for a comprehensive physical exam.   HPI  Health Maintenance Due  Topic Date Due   Hepatitis C Screening  Never done   Fecal DNA (Cologuard)  Never done   Pneumonia Vaccine 71+ Years old (2 - PPSV23 if available, else PCV20) 04/03/2020    Acute Concerns: Eye Issue Edwin Torres expresses he has been experiencing what appears to be a mucus like film over his eyes. States that this has not caused him any trouble with seeing, but he finds this to be annoying. He has not followed up with ophthalmology, but is interested in doing so if needed.   Denies suspicion of new allergies, environmental changes, and pain.  Chronic Issues: Perivascular dermatitis  For the past 3 years, Edwin Torres has been dealing with an itchy and burning sensation all over his body. Describes sensation as electrical shocks in various parts of his body. Pt has previously followed up about this issue with [facility] where he underwent a skin biopsy x 4 sites that showed sparse perivascular dermatitis. Most recently he followed up with Dr. Ronalee Red, dermatology, for a second opinion but was met with the same dx. At the time of this visit pt was compliant with gabapentin 300 mg four times daily but was still having discomfort. In an effort to provide additional relief, he was recommended by Dr. Ronalee Red to participate in light therapy such as tanning and prescribed pregabalin 25 mg TID.   Currently pt is no longer compliant with pregabalin 25 mg TID due to increased burning sensation upon use and has not participated in light therapy. At this time he states he has switched back to taking gabapentin 300 mg QID since it provides minor relief. He is not interested in participating in light therapy or returning to Dr. Ronalee Red.   Tobacco Use Pt has been smoking since 1980 and averages about half a pack a day currently. He is not interested in stopping at this time or  completing any lung cancer screening.   Health Maintenance: Immunizations -- Covid- Declined Influenza-Declined Tdap- QIH;4742 Colonoscopy -- Will complete cologuard PSA -- Declined Diet -- Eats all food groups  Sleep habits -- No concerns Exercise -- Not currently  Weight -- Stable Recent weight history Wt Readings from Last 10 Encounters:  03/26/21 153 lb 4 oz (69.5 kg)  09/10/20 147 lb 3.2 oz (66.8 kg)  08/16/20 150 lb 12.8 oz (68.4 kg)  01/13/20 137 lb 4 oz (62.3 kg)  11/29/19 132 lb 3.2 oz (60 kg)  11/18/19 130 lb 3.2 oz (59.1 kg)  10/06/19 123 lb (55.8 kg)  10/03/19 124 lb (56.2 kg)  09/17/19 120 lb 13 oz (54.8 kg)  08/19/19 131 lb 6.1 oz (59.6 kg)   Body mass index is 28.96 kg/m. Mood -- Stable Alcohol use --  reports that he does not currently use alcohol.  Tobacco use --  Tobacco Use: High Risk   Smoking Tobacco Use: Every Day   Smokeless Tobacco Use: Never   Passive Exposure: Not on file     Depression screen [facility] 2/9 03/26/2021  Decreased Interest 0  Down, Depressed, Hopeless 0  PHQ - 2 Score 0    Other providers/specialists: Patient Care Team: Inda Coke, [place] as PCP - General (Physician Assistant) Cameron Sprang, MD as Consulting Physician (Neurology) Jonathon Jordan, MD as Consulting Physician (Family Medicine)    PMHx, SurgHx, SocialHx, Medications, and Allergies were reviewed  in the Visit Navigator and updated as appropriate.   Past Medical History:  Diagnosis Date   Alcohol addiction ([place])    Allergy    Cancer ([place])    Melanoma on face was removed   Depression    Herpes genitalis in men    History of chicken pox    Seizures ([place])      Past Surgical History:  Procedure Laterality Date   FOOT FRACTURE SURGERY Bilateral    NOSE SURGERY     VASECTOMY       Family History  Problem Relation Age of Onset   Cancer Mother        bladder   Diabetes Mother    Arthritis Mother    Alcohol abuse Father    Arthritis Sister      Social History   Tobacco Use   Smoking status: Every Day    Packs/day: 0.50    Years: 43.00    Pack years: 21.50    Types: Cigarettes    Start date: 1980   Smokeless tobacco: Never  Vaping Use   Vaping Use: Never used  Substance Use Topics   Alcohol use: Not Currently   Drug use: No    Review of Systems:   Review of Systems  Constitutional:  Negative for chills, fever, malaise/fatigue and weight loss.  HENT:  Negative for hearing loss, sinus pain and sore throat.   Respiratory:  Negative for cough and hemoptysis.   Cardiovascular:  Negative for chest pain, palpitations, leg swelling and PND.  Gastrointestinal:  Negative for abdominal pain, constipation, diarrhea, heartburn, nausea and vomiting.  Genitourinary:  Negative for dysuria, frequency and urgency.  Musculoskeletal:  Negative for back pain, myalgias and neck pain.  Skin:  Negative for itching and rash.  Neurological:  Negative for dizziness, tingling, seizures and headaches.  Endo/Heme/Allergies:  Negative for polydipsia.  Psychiatric/Behavioral:  Negative for depression. The patient is not nervous/anxious.     Objective:    Vitals:   03/26/21 0952  BP: 102/70  Pulse: 61  Temp: 98.8 F (37.1 C)  SpO2: 96%   Body mass index is 28.96 kg/m.  General  Alert, cooperative, no distress, appears stated age  Head:  Normocephalic, without obvious abnormality, atraumatic  Eyes:  PERRL, conjunctiva/corneas clear, EOM's intact, fundi benign, both eyes       Ears:  Normal TM's and external ear canals, both ears  Nose: Nares normal, septum midline, mucosa normal, no drainage or sinus tenderness  Throat: Lips, mucosa, and tongue normal; teeth and gums normal  Neck: Supple, symmetrical, trachea midline, no adenopathy;     thyroid:  No enlargement/tenderness/nodules; no carotid bruit or JVD  Back:   Symmetric, no curvature, ROM normal, no CVA tenderness  Lungs:   Clear to auscultation bilaterally, respirations  unlabored  Chest wall:  No tenderness or deformity  Heart:  Regular rate and rhythm, S1 and S2 normal, no murmur, rub or gallop  Abdomen:   Soft, non-tender, bowel sounds active all four quadrants, no masses, no organomegaly  Extremities: Extremities normal, atraumatic, no cyanosis or edema  Prostate : Deferred  Skin: Skin color, texture, turgor normal, no rashes or lesions  Lymph nodes: Cervical, supraclavicular, and axillary nodes normal  Neurologic: CNII-XII grossly intact. Normal strength, sensation and reflexes throughout   AssessmentPlan:   Routine physical examination with abnormal findings Today patient counseled on age appropriate routine health concerns for screening and prevention, each reviewed and up to date or declined. Immunizations reviewed and  up to date or declined. Labs ordered and reviewed. Risk factors for depression reviewed and negative. Hearing function and visual acuity are intact. ADLs screened and addressed as needed. Functional ability and level of safety reviewed and appropriate. Education, counseling and referrals performed based on assessed risks today. Patient provided with a copy of personalized plan for preventive services.  *Patient REFUSES low-dose CT lung cancer screening  Chronic pruritus Stable, room for improvement Increase gabapentin to 400 mg four times daily  Follow-up in 6 months, sooner if concerns   Eye irritation No red flags Trial Patanol Ophthalmic solution, 1 drop in both eyes twice daily Referral to optho placed If new/worsening symptoms occur, informed patient to follow up with ophthalmology    Tobacco abuse Informed patient of potential risk factors due to prolonged tobacco use, patient verbalized understanding and declines further intervention  Encounter for complete eye exam Referred to ophthalmology for completion   Encounter for screening for other viral diseases  - Hepatitis C Antibody  Special screening for malignant  neoplasms, colon No red flags  Cologuard order has been placed  Encounter for lipid screening for cardiovascular disease Update lipid panel today, will start medication as indicated   Need for prophylactic vaccination against Streptococcus pneumoniae (pneumococcus) Completed today       Patient Counseling: _0   Nutrition: Stressed importance of moderation in sodium/caffeine intake, saturated fat and cholesterol, caloric balance, sufficient intake of fresh fruits, vegetables, and fiber  _1   Stressed the importance of regular exercise.   _2   Substance Abuse: Discussed cessation/primary prevention of tobacco, alcohol, or other drug use; driving or other dangerous activities under the influence; availability of treatment for abuse.   _3   Injury prevention: Discussed safety belts, safety helmets, smoke detector, smoking near bedding or upholstery.   _4   Sexuality: Discussed sexually transmitted diseases, partner selection, use of condoms, avoidance of unintended pregnancy  and contraceptive alternatives.   _5   Dental health: Discussed importance of regular tooth brushing, flossing, and dental visits.  _6   Health maintenance and immunizations reviewed. Please refer to Health maintenance section.   I,Havlyn C Ratchford,acting as a Education administrator for [employer], PA.,have documented all relevant documentation on the behalf of Inda Coke, PA,as directed by  Inda Coke, PA while in the presence of Inda Coke, [place].  I, Inda Coke, [place], have reviewed all documentation for this visit. The documentation on 03/26/21 for the exam, diagnosis, procedures, and orders are all accurate and complete.   Inda Coke, PA-C [place]

## 2021-03-28 LAB — HCV RNA,QUANTITATIVE REAL TIME PCR
HCV Quantitative Log: <1.18 Log IU/mL
HCV RNA, PCR, QN: <15 IU/mL

## 2021-03-28 LAB — HEPATITIS C ANTIBODY
Hepatitis C Ab: REACTIVE — AB
SIGNAL TO CUT-OFF: >11 — ABNORMAL HIGH (ref <1.00)

## 2021-06-27 ENCOUNTER — Ambulatory Visit (INDEPENDENT_AMBULATORY_CARE_PROVIDER_SITE_OTHER): Payer: PPO

## 2021-06-27 DIAGNOSIS — Z0000 Encounter for general adult medical examination without abnormal findings: Secondary | ICD-10-CM | POA: Diagnosis not present

## 2021-06-27 NOTE — Progress Notes (Signed)
Virtual Visit via Telephone Note ? ?I connected with  Edwin Torres on 06/27/21 at  9:15 AM EDT by telephone and verified that I am speaking with the correct person using two identifiers. ? ?Medicare Annual Wellness visit completed telephonically due to Covid-19 pandemic.  ? ?Persons participating in this call: This Health Coach and this patient.  ? ?Location: ?Patient: home ?Provider: office ?  ?I discussed the limitations, risks, security and privacy concerns of performing an evaluation and management service by telephone and the availability of in person appointments. The patient expressed understanding and agreed to proceed. ? ?Unable to perform video visit due to video visit attempted and failed and/or patient does not have video capability.  ? ?Some vital signs may be absent or patient reported.  ? ?Willette Brace, LPN ? ? ?Subjective:  ? Edwin Torres is a 66 y.o. male who presents for Medicare Annual/Subsequent preventive examination. ? ?Review of Systems    ? ?Cardiac Risk Factors include: advanced age (>79mn, >>59women);smoking/ tobacco exposure;male gender ? ?   ?Objective:  ?  ?There were no vitals filed for this visit. ?There is no height or weight on file to calculate BMI. ? ? ?  06/27/2021  ?  9:17 AM 06/21/2020  ?  9:39 AM 11/29/2019  ? 10:23 AM 09/08/2019  ?  1:37 PM 05/19/2019  ?  1:15 PM 03/10/2019  ? 12:27 PM 03/04/2019  ? 10:52 AM  ?Advanced Directives  ?Does Patient Have a Medical Advance Directive? No No No No No Yes Yes  ?Does patient want to make changes to medical advance directive?      No - Patient declined No - Patient declined  ?Would patient like information on creating a medical advance directive? No - Patient declined No - Patient declined  No - Patient declined Yes (MAU/Ambulatory/Procedural Areas - Information given)    ? ? ?Current Medications (verified) ?Outpatient Encounter Medications as of 06/27/2021  ?Medication Sig  ? feeding supplement, ENSURE ENLIVE, (ENSURE ENLIVE) LIQD Take  237 mLs by mouth 2 (two) times daily between meals.  ? ibuprofen (ADVIL) 200 MG tablet Take 600 mg by mouth every 6 (six) hours as needed for headache or moderate pain.   ? loratadine (CLARITIN) 10 MG tablet Take by mouth.  ? olopatadine (PATANOL) 0.1 % ophthalmic solution Place 1 drop into both eyes 2 (two) times daily.  ? gabapentin (NEURONTIN) 400 MG capsule Take 1 capsule (400 mg total) by mouth 4 (four) times daily.  ? [DISCONTINUED] gabapentin (NEURONTIN) 300 MG capsule Take 1 capsule (300 mg total) by mouth 4 (four) times daily.  ? ?No facility-administered encounter medications on file as of 06/27/2021.  ? ? ?Allergies (verified) ?Penicillins and Ativan [lorazepam]  ? ?History: ?Past Medical History:  ?Diagnosis Date  ? [place]   ? Allergy   ? Cancer ([facility]   ? Melanoma on face was removed  ? Depression   ? Herpes genitalis in men   ? History of chicken pox   ? [place]   ? ?Past Surgical History:  ?Procedure Laterality Date  ? FOOT FRACTURE SURGERY Bilateral   ? NOSE SURGERY    ? VASECTOMY    ? ?Family History  ?Problem Relation Age of Onset  ? Cancer Mother   ?     bladder  ? Diabetes Mother   ? Arthritis Mother   ? Alcohol abuse Father   ? Arthritis Sister   ? ?Social History  ? ?Socioeconomic  History  ? Marital status: Married  ?  Spouse name: Not on file  ? Number of children: Not on file  ? Years of education: Not on file  ? Highest education level: Not on file  ?Occupational History  ? Not on file  ?Tobacco Use  ? Smoking status: Every Day  ?  Packs/day: 0.50  ?  Years: 43.00  ?  Pack years: 21.50  ?  Types: Cigarettes  ?  Start date: 46  ? Smokeless tobacco: Never  ?Vaping Use  ? Vaping Use: Never used  ?Substance and Sexual Activity  ? Alcohol use: Not Currently  ? Drug use: No  ? Sexual activity: Yes  ?  Partners: Female  ?  Birth control/protection: None  ?Other Topics Concern  ? Not on file  ?Social History Narrative  ? 2012 fell from roof, disabled since  ? Right handed   ?  Lives with wife  ? ?Social Determinants of Health  ? ?Financial Resource Strain: Low Risk   ? Difficulty of Paying Living Expenses: Not hard at all  ?Food Insecurity: No Food Insecurity  ? Worried About Charity fundraiser in the Last Year: Never true  ? Ran Out of Food in the Last Year: Never true  ?Transportation Needs: No Transportation Needs  ? Lack of Transportation (Medical): No  ? Lack of Transportation (Non-Medical): No  ?Physical Activity: Sufficiently Active  ? Days of Exercise per Week: 5 days  ? Minutes of Exercise per Session: 30 min  ?Stress: No Stress Concern Present  ? Feeling of Stress : Not at all  ?Social Connections: Socially Isolated  ? Frequency of Communication with Friends and Family: Once a week  ? Frequency of Social Gatherings with Friends and Family: Once a week  ? Attends Religious Services: Never  ? Active Member of Clubs or Organizations: No  ? Attends Archivist Meetings: Never  ? Marital Status: Married  ? ? ?Tobacco Counseling ?Ready to quit: Not Answered ?Counseling given: Not Answered ? ? ?Clinical Intake: ? ?Pre-visit preparation completed: Yes ? ?Pain : No/denies pain ? ?  ? ?BMI - recorded: 28.97 ?Nutritional Status: BMI 25 -29 Overweight ?Nutritional Risks: None ?Diabetes: No ? ?How often do you need to have someone help you when you read instructions, pamphlets, or other written materials from your doctor or pharmacy?: 1 - Never ? ?Diabetic?No ? ?Interpreter Needed?: No ? ?Information entered by :: Charlott Rakes, LPN ? ? ?Activities of Daily Living ? ?  06/27/2021  ?  9:18 AM  ?In your present state of health, do you have any difficulty performing the following activities:  ?Hearing? 0  ?Vision? 0  ?Difficulty concentrating or making decisions? 0  ?Walking or climbing stairs? 0  ?Dressing or bathing? 0  ?Doing errands, shopping? 0  ?Preparing Food and eating ? N  ?Using the Toilet? N  ?In the past six months, have you accidently leaked urine? N  ?Do you have problems  with loss of bowel control? N  ?Managing your Medications? N  ?Managing your Finances? N  ?Housekeeping or managing your Housekeeping? N  ? ? ?Patient Care Team: ?Inda Coke, [place] as PCP - General (Physician Assistant) ?Cameron Sprang, MD as Consulting Physician (Neurology) ?Jonathon Jordan, MD as Consulting Physician (Family Medicine) ? ?Indicate any recent Medical Services you may have received from other than [facility] providers in the past year (date may be approximate). ? ?   ?Assessment:  ? This is a routine  wellness examination for Edwin Torres. ? ?Hearing/Vision screen ?Hearing Screening - Comments:: Pt denies any hearing issues  ?Vision Screening - Comments:: Pt stated he follow up with provider unsure of name  ? ?Dietary issues and exercise activities discussed: ?Current Exercise Habits: The patient does not participate in regular exercise at present ? ? Goals Addressed   ? ?  ?  ?  ?  ? This Visit's Progress  ?  Patient Stated     ?  None at this time  ?  ? ?  ? ?Depression Screen ? ?  06/27/2021  ?  9:16 AM 03/26/2021  ?  9:54 AM 06/21/2020  ?  9:38 AM 05/19/2019  ?  1:17 PM 08/20/2016  ?  5:23 PM 03/10/2016  ?  5:29 PM  ?PHQ 2/9 Scores  ?PHQ - 2 Score 0 0 0 0 0 0  ?  ?Fall Risk ? ?  06/27/2021  ?  9:18 AM 06/21/2020  ?  9:40 AM 11/29/2019  ? 10:23 AM 05/19/2019  ?  1:17 PM 08/20/2016  ?  5:23 PM  ?Fall Risk   ?Falls in the past year? 0 0 1 1 No  ?Number falls in past yr: 0 0 1 1   ?Injury with Fall? 0 0 1 1   ?Risk for fall due to : Impaired vision Impaired vision;History of fall(s)  History of fall(s);Impaired mobility   ?Follow up Falls prevention discussed Falls prevention discussed  Education provided;Falls prevention discussed;Falls evaluation completed   ? ? ?FALL RISK PREVENTION PERTAINING TO THE HOME: ? ?Any stairs in or around the home? No  ?If so, are there any without handrails? No  ?Home free of loose throw rugs in walkways, pet beds, electrical cords, etc? Yes  ?Adequate lighting in your home to reduce risk of  falls? Yes  ? ?ASSISTIVE DEVICES UTILIZED TO PREVENT FALLS: ? ?Life alert? No  ?Use of a cane, walker or w/c? Yes  ?Grab bars in the bathroom? No  ?Shower chair or bench in shower? Yes  ?Elevated to

## 2021-06-27 NOTE — Patient Instructions (Signed)
Edwin Torres , ?Thank you for taking time to come for your Medicare Wellness Visit. I appreciate your ongoing commitment to your health goals. Please review the following plan we discussed and let me know if I can assist you in the future.  ? ?Screening recommendations/referrals: ?Colonoscopy: pt stated he has a cologuard on hand  ?Recommended yearly ophthalmology/optometry visit for glaucoma screening and checkup ?Recommended yearly dental visit for hygiene and checkup ? ?Vaccinations: ?Influenza vaccine: declined  ?Pneumococcal vaccine: Up to date ?Tdap vaccine: Done 02/22/19 repeat every 10 years  ?Shingles vaccine: Shingrix discussed. Please contact your pharmacy for coverage information.    ?Covid-19: declined and discussed  ? ?Advanced directives: Advance directive discussed with you today. Even though you declined this today please call our office should you change your mind and we can give you the proper paperwork for you to fill out. ? ?Conditions/risks identified: None at this time  ? ?Next appointment: Follow up in one year for your annual wellness visit.  ? ?Preventive Care 67 Years and Older, Male ?Preventive care refers to lifestyle choices and visits with your health care provider that can promote health and wellness. ?What does preventive care include? ?A yearly physical exam. This is also called an annual well check. ?Dental exams once or twice a year. ?Routine eye exams. Ask your health care provider how often you should have your eyes checked. ?Personal lifestyle choices, including: ?Daily care of your teeth and gums. ?Regular physical activity. ?Eating a healthy diet. ?Avoiding tobacco and drug use. ?Limiting alcohol use. ?Practicing safe sex. ?Taking low doses of aspirin every day. ?Taking vitamin and mineral supplements as recommended by your health care provider. ?What happens during an annual well check? ?The services and screenings done by your health care provider during your annual well  check will depend on your age, overall health, lifestyle risk factors, and family history of disease. ?Counseling  ?Your health care provider may ask you questions about your: ?Alcohol use. ?Tobacco use. ?Drug use. ?Emotional well-being. ?Home and relationship well-being. ?Sexual activity. ?Eating habits. ?History of falls. ?Memory and ability to understand (cognition). ?Work and work Statistician. ?Screening  ?You may have the following tests or measurements: ?Height, weight, and BMI. ?Blood pressure. ?Lipid and cholesterol levels. These may be checked every 5 years, or more frequently if you are over 91 years old. ?Skin check. ?Lung cancer screening. You may have this screening every year starting at age 54 if you have a 30-pack-year history of smoking and currently smoke or have quit within the past 15 years. ?Fecal occult blood test (FOBT) of the stool. You may have this test every year starting at age 48. ?Flexible sigmoidoscopy or colonoscopy. You may have a sigmoidoscopy every 5 years or a colonoscopy every 10 years starting at age 82. ?Prostate cancer screening. Recommendations will vary depending on your family history and other risks. ?Hepatitis C blood test. ?Hepatitis B blood test. ?Sexually transmitted disease (STD) testing. ?Diabetes screening. This is done by checking your blood sugar (glucose) after you have not eaten for a while (fasting). You may have this done every 1-3 years. ?Abdominal aortic aneurysm (AAA) screening. You may need this if you are a current or former smoker. ?Osteoporosis. You may be screened starting at age 87 if you are at high risk. ?Talk with your health care provider about your test results, treatment options, and if necessary, the need for more tests. ?Vaccines  ?Your health care provider may recommend certain vaccines, such as: ?Influenza vaccine.  This is recommended every year. ?Tetanus, diphtheria, and acellular pertussis (Tdap, Td) vaccine. You may need a Td booster  every 10 years. ?Zoster vaccine. You may need this after age 31. ?Pneumococcal 13-valent conjugate (PCV13) vaccine. One dose is recommended after age 70. ?Pneumococcal polysaccharide (PPSV23) vaccine. One dose is recommended after age 19. ?Talk to your health care provider about which screenings and vaccines you need and how often you need them. ?This information is not intended to replace advice given to you by your health care provider. Make sure you discuss any questions you have with your health care provider. ?Document Released: 03/09/2015 Document Revised: 10/31/2015 Document Reviewed: 12/12/2014 ?Elsevier Interactive Patient Education ? 2017 [employer]. ? ?Fall Prevention in the Home ?Falls can cause injuries. They can happen to people of all ages. There are many things you can do to make your home safe and to help prevent falls. ?What can I do on the outside of my home? ?Regularly fix the edges of walkways and driveways and fix any cracks. ?Remove anything that might make you trip as you walk through a door, such as a raised step or threshold. ?Trim any bushes or trees on the path to your home. ?Use bright outdoor lighting. ?Clear any walking paths of anything that might make someone trip, such as rocks or tools. ?Regularly check to see if handrails are loose or broken. Make sure that both sides of any steps have handrails. ?Any raised decks and porches should have guardrails on the edges. ?Have any leaves, snow, or ice cleared regularly. ?Use sand or salt on walking paths during winter. ?Clean up any spills in your garage right away. This includes oil or grease spills. ?What can I do in the bathroom? ?Use night lights. ?Install grab bars by the toilet and in the tub and shower. Do not use towel bars as grab bars. ?Use non-skid mats or decals in the tub or shower. ?If you need to sit down in the shower, use a plastic, non-slip stool. ?Keep the floor dry. Clean up any water that spills on the floor as soon  as it happens. ?Remove soap buildup in the tub or shower regularly. ?Attach bath mats securely with double-sided non-slip rug tape. ?Do not have throw rugs and other things on the floor that can make you trip. ?What can I do in the bedroom? ?Use night lights. ?Make sure that you have a light by your bed that is easy to reach. ?Do not use any sheets or blankets that are too big for your bed. They should not hang down onto the floor. ?Have a firm chair that has side arms. You can use this for support while you get dressed. ?Do not have throw rugs and other things on the floor that can make you trip. ?What can I do in the kitchen? ?Clean up any spills right away. ?Avoid walking on wet floors. ?Keep items that you use a lot in easy-to-reach places. ?If you need to reach something above you, use a strong step stool that has a grab bar. ?Keep electrical cords out of the way. ?Do not use floor polish or wax that makes floors slippery. If you must use wax, use non-skid floor wax. ?Do not have throw rugs and other things on the floor that can make you trip. ?What can I do with my stairs? ?Do not leave any items on the stairs. ?Make sure that there are handrails on both sides of the stairs and use them. Fix handrails  that are broken or loose. Make sure that handrails are as long as the stairways. ?Check any carpeting to make sure that it is firmly attached to the stairs. Fix any carpet that is loose or worn. ?Avoid having throw rugs at the top or bottom of the stairs. If you do have throw rugs, attach them to the floor with carpet tape. ?Make sure that you have a light switch at the top of the stairs and the bottom of the stairs. If you do not have them, ask someone to add them for you. ?What else can I do to help prevent falls? ?Wear shoes that: ?Do not have high heels. ?Have rubber bottoms. ?Are comfortable and fit you well. ?Are closed at the toe. Do not wear sandals. ?If you use a stepladder: ?Make sure that it is fully  opened. Do not climb a closed stepladder. ?Make sure that both sides of the stepladder are locked into place. ?Ask someone to hold it for you, if possible. ?Clearly mark and make sure that you can see: ?Any g

## 2021-07-10 IMAGING — DX DG CHEST 2V
3 series · 3 of 3 positions shown · non-contrast
Comparison: Chest x-ray 10/06/2019, CT chest 09/14/2019

CLINICAL DATA: Pneumonia, empyema, tobacco use

EXAM:
CHEST - 2 VIEW

[chest pa]
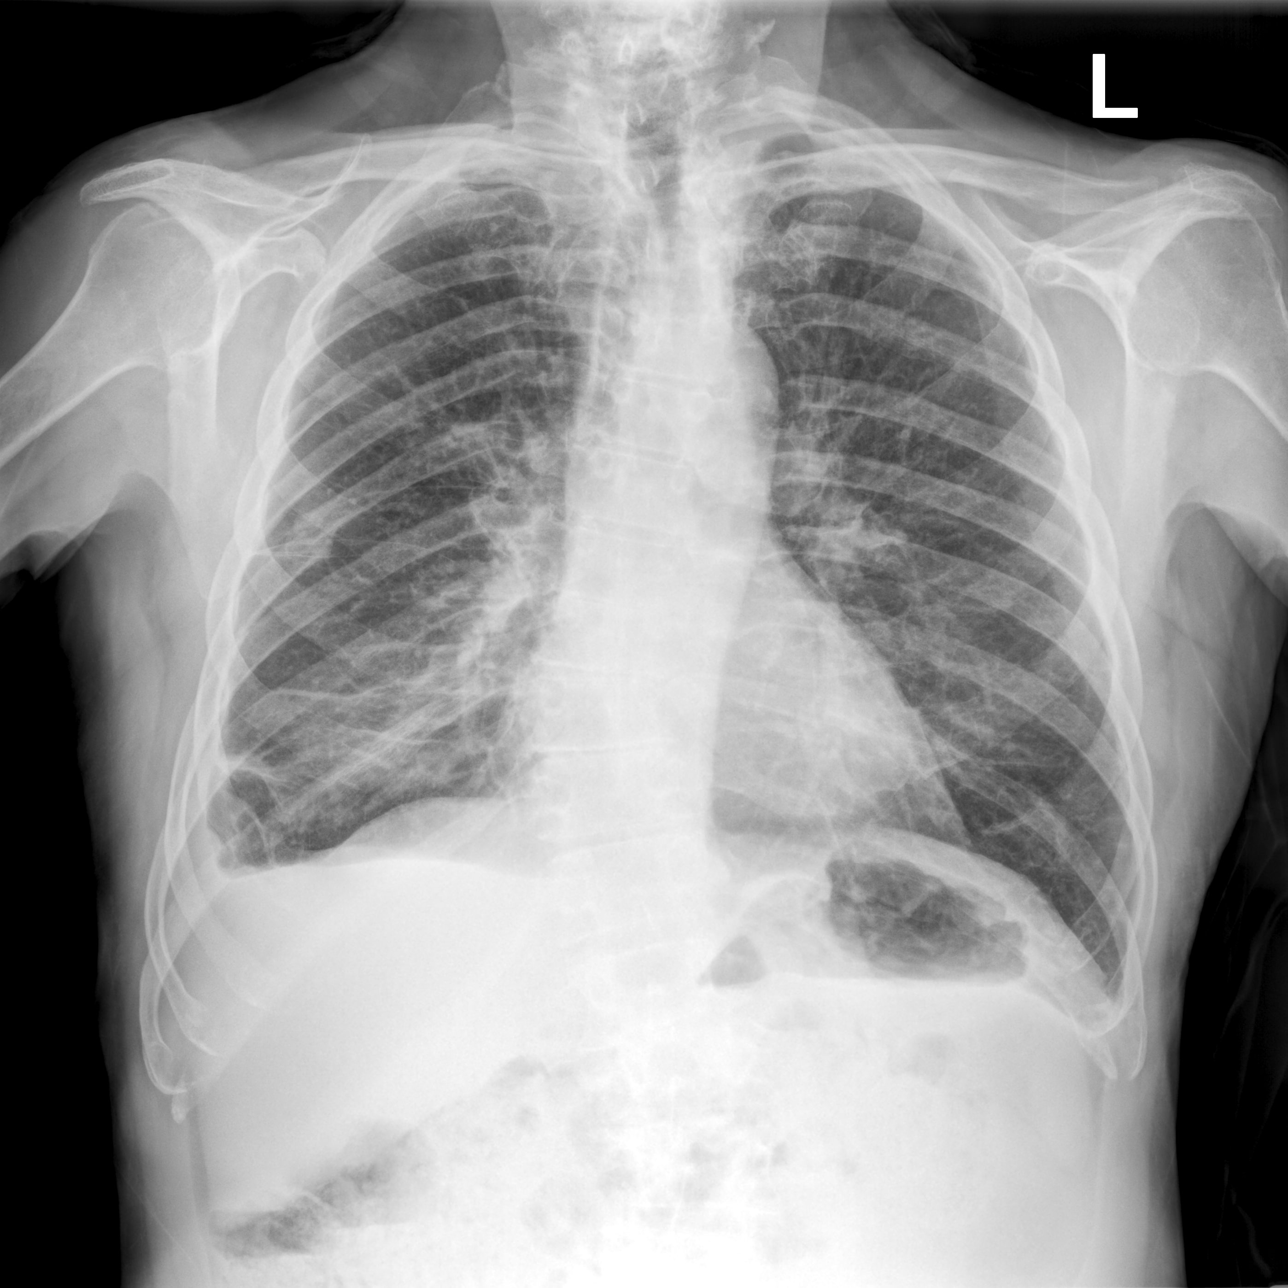

[chest lat (1 of 2)]
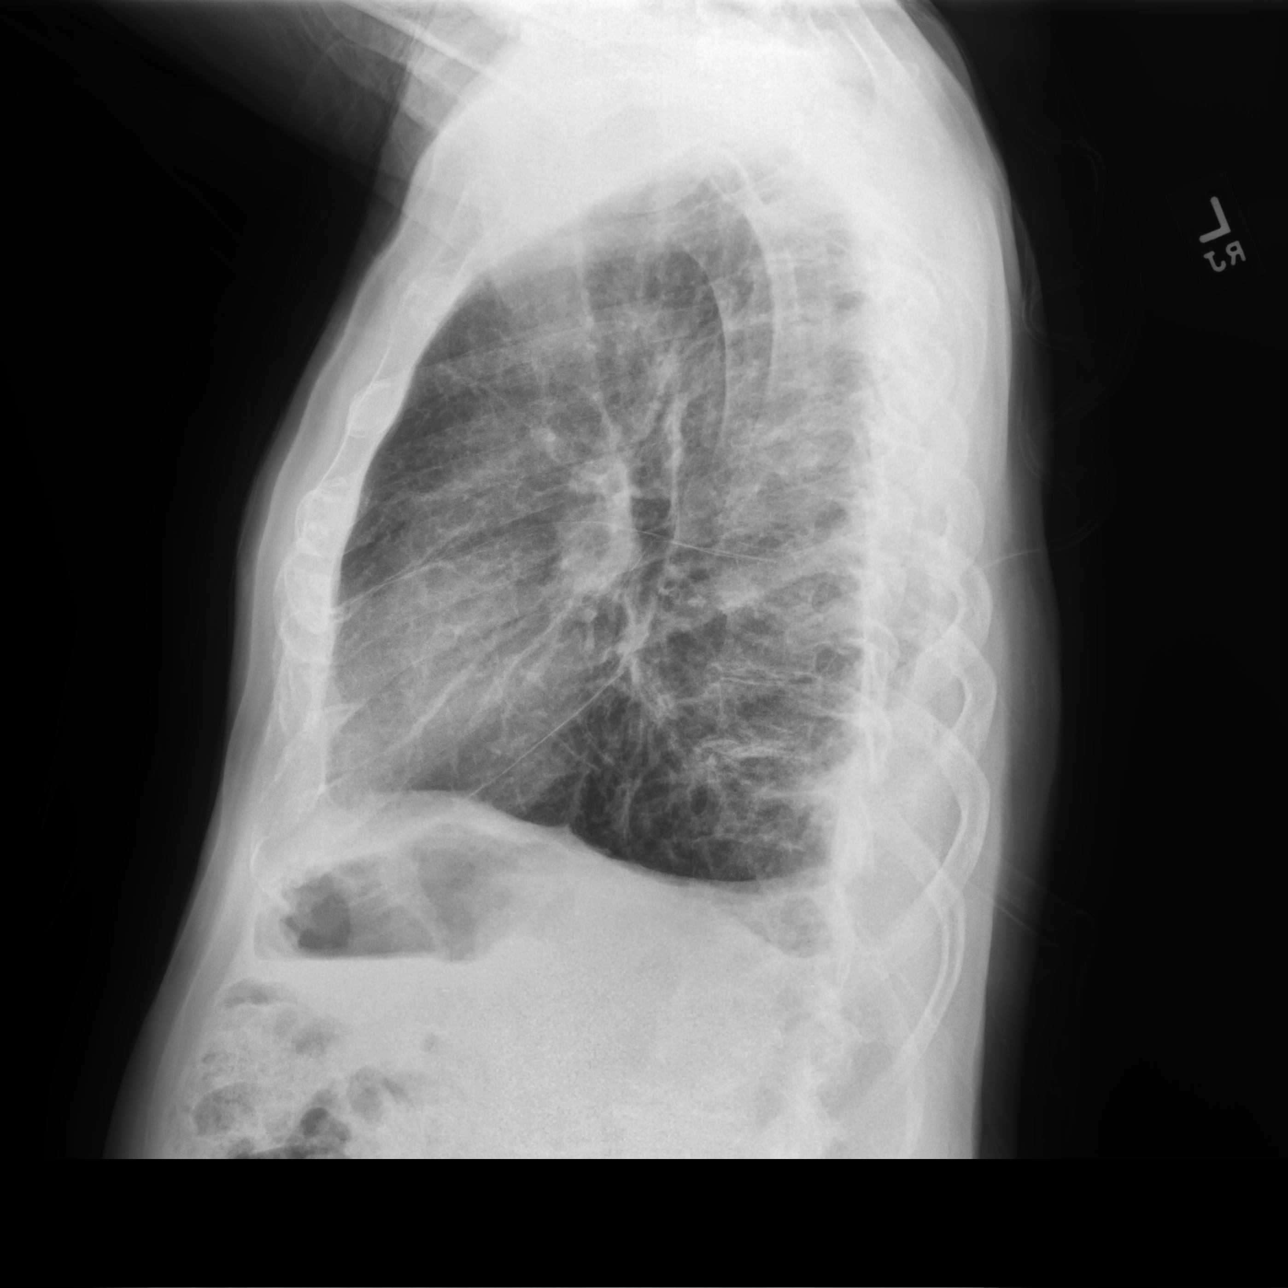

[chest lat (2 of 2)]
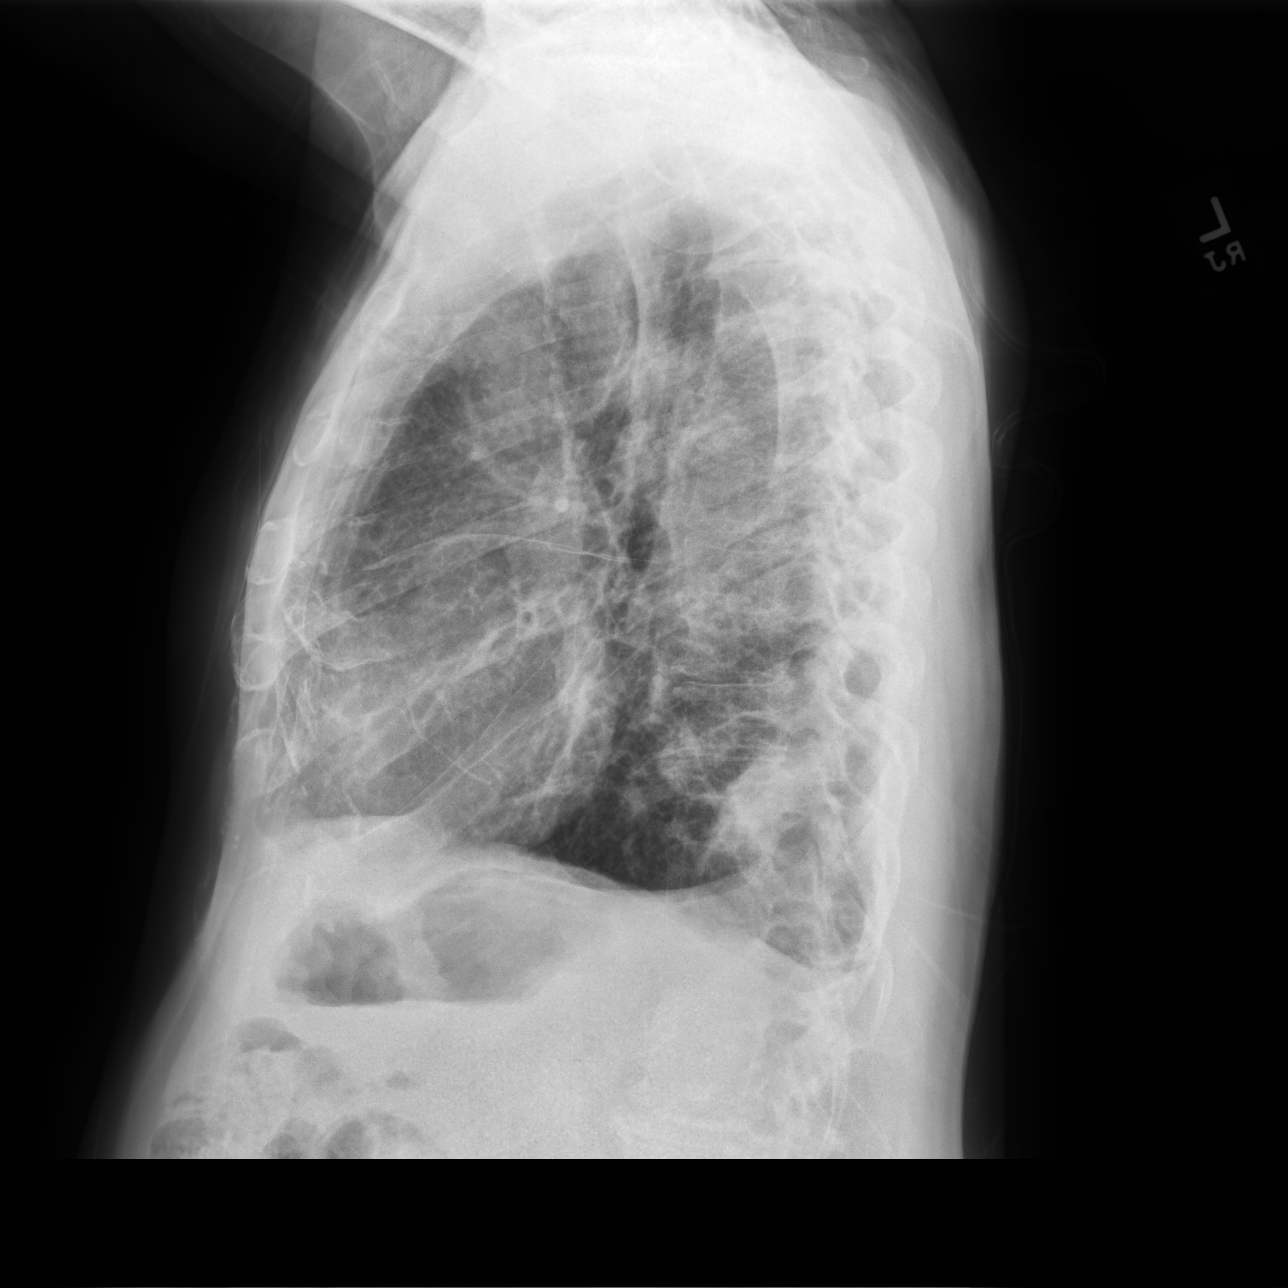

[3 of 3 positions shown; findings below may reference images not displayed]

FINDINGS: The heart size and mediastinal contours are within normal limits.

Right basilar linear atelectasis versus scarring. Persistent
increased interstitial markings diffusely with more apparent
reticulations within the perihilar areas and right lower/middle
lobes. No focal consolidation. No pulmonary edema. Similar-appearing
loculated trace to small volume right pleural effusion. No
pneumothorax.

No acute osseous abnormality.
IMPRESSION: Chronic right loculated pleural effusion with possible right
lower/middle lobe inflammation/infection.

## 2021-09-23 ENCOUNTER — Encounter: Payer: Self-pay | Admitting: Physician Assistant

## 2021-09-23 ENCOUNTER — Ambulatory Visit (INDEPENDENT_AMBULATORY_CARE_PROVIDER_SITE_OTHER): Payer: PPO | Admitting: Physician Assistant

## 2021-09-23 VITALS — BP 140/70 | HR 63 | Temp 98.3°F | Ht 61.0 in | Wt 156.4 lb

## 2021-09-23 DIAGNOSIS — R7309 Other abnormal glucose: Secondary | ICD-10-CM

## 2021-09-23 DIAGNOSIS — L308 Other specified dermatitis: Secondary | ICD-10-CM | POA: Diagnosis not present

## 2021-09-23 DIAGNOSIS — R030 Elevated blood-pressure reading, without diagnosis of hypertension: Secondary | ICD-10-CM

## 2021-09-23 LAB — COMPREHENSIVE METABOLIC PANEL
ALT: 11 U/L (ref 0–53)
AST: 17 U/L (ref 0–37)
Albumin: 4.1 g/dL (ref 3.5–5.2)
Alkaline Phosphatase: 87 U/L (ref 39–117)
BUN: 5 mg/dL — ABNORMAL LOW (ref 6–23)
CO2: 28 mEq/L (ref 19–32)
Calcium: 9.4 mg/dL (ref 8.4–10.5)
Chloride: 98 mEq/L (ref 96–112)
Creatinine, Ser: 0.75 mg/dL (ref 0.40–1.50)
GFR: 93.39 mL/min (ref >60.00)
Glucose, Bld: 80 mg/dL (ref 70–99)
Potassium: 4.1 mEq/L (ref 3.5–5.1)
Sodium: 133 mEq/L — ABNORMAL LOW (ref 135–145)
Total Bilirubin: 0.6 mg/dL (ref 0.2–1.2)
Total Protein: 7 g/dL (ref 6.0–8.3)

## 2021-09-23 LAB — HEMOGLOBIN A1C: Hgb A1c MFr Bld: 6.5 % (ref 4.6–6.5)

## 2021-09-23 NOTE — Progress Notes (Signed)
Edwin Torres is a 66 y.o. male here for a follow up of a pre-existing problem.  History of Present Illness:   Chief Complaint  Patient presents with   Pruritis    Pt said it the same, no worse.    HPI  Perivascular Dermatitis  Patient here to 6 months follow-up. He is currently taking Gabapentin 400 mg QID daily. Patient reports he still has been dealing with itchy and burning sensation. Tolerating his medication well with no adverse side effects. Denies any other concerns at this time. Denies joint pain.   Elevated Blood Pressure Readings  Patient states he has not slept well last night and thinks this could be contributing. He does admit drinking coffee this morning. Currently taking no medication. At home blood pressure readings are: not checked. Patient denies chest pain, SOB, blurred vision, dizziness, unusual headaches, lower leg swelling.  Denies excessive caffeine intake, stimulant usage, excessive alcohol intake, or increase in salt consumption.  BP Readings from Last 3 Encounters:  09/23/21 (!) 156/70  03/26/21 102/70  09/10/20 137/83   Pre-diabetes  Patient was found to have elevated HgbA1c of 6.5 in May 2021. States he has been trying to avoid eating sugary foods. Does get sweets craving but he tries to not eat sugary food. Per pt, he drinks coffee with sugar about a teaspoon of this. States he would like to have this rechecked today. He is willing to start medication if work-up showed her blood sugar to be in diabetic range. Denies: hypoglycemic or hyperglycemic episodes or symptoms.   Lab Results  Component Value Date   HGBA1C 6.4 (A) 01/13/2020    Past Medical History:  Diagnosis Date   Alcohol addiction ([place])    Allergy    Cancer ([place])    Melanoma on face was removed   Depression    Herpes genitalis in men    History of chicken pox    Seizures ([facility])      Social History   Tobacco Use   Smoking status: Every Day    Packs/day: 0.50    Years: 43.00     Total pack years: 21.50    Types: Cigarettes    Start date: 63   Smokeless tobacco: Never  Vaping Use   Vaping Use: Never used  Substance Use Topics   Alcohol use: Not Currently   Drug use: No    Past Surgical History:  Procedure Laterality Date   FOOT FRACTURE SURGERY Bilateral    NOSE SURGERY     VASECTOMY      Family History  Problem Relation Age of Onset   Cancer Mother        bladder   Diabetes Mother    Arthritis Mother    Alcohol abuse Father    Arthritis Sister     Allergies  Allergen Reactions   Penicillins Anaphylaxis    Has patient had a PCN reaction causing immediate rash, facial/tongue/throat swelling, SOB or lightheadedness with hypotension: Yes Has patient had a PCN reaction causing severe rash involving mucus membranes or skin necrosis: No Has patient had a PCN reaction that required hospitalization: Yes Has patient had a PCN reaction occurring within the last 10 years: No If all of the above answers are "NO", then may proceed with Cephalosporin use.    Ativan [Lorazepam] Other (See Comments)    Made him crazy    Current Medications:   Current Outpatient Medications:    feeding supplement, ENSURE ENLIVE, (ENSURE ENLIVE) LIQD, Take 237  mLs by mouth 2 (two) times daily between meals., Disp: , Rfl:    ibuprofen (ADVIL) 200 MG tablet, Take 600 mg by mouth every 6 (six) hours as needed for headache or moderate pain. , Disp: , Rfl:    loratadine (CLARITIN) 10 MG tablet, Take by mouth., Disp: , Rfl:    olopatadine (PATANOL) 0.1 % ophthalmic solution, Place 1 drop into both eyes 2 (two) times daily., Disp: 5 mL, Rfl: 12   gabapentin (NEURONTIN) 400 MG capsule, Take 1 capsule (400 mg total) by mouth 4 (four) times daily., Disp: 360 capsule, Rfl: 1   Review of Systems:   ROS Negative unless otherwise specified per HPI.   Vitals:   Vitals:   09/23/21 1053  BP: (!) 156/70  Pulse: 63  Temp: 98.3 F (36.8 C)  TempSrc: Temporal  SpO2: 96%  Weight:  156 lb 6.1 oz (70.9 kg)  Height: '5\' 1"'$  (1.549 m)     Body mass index is 29.55 kg/m.  Physical Exam:   Physical Exam Vitals and nursing note reviewed.  Constitutional:      General: He is not in acute distress.    Appearance: He is well-developed. He is not ill-appearing or toxic-appearing.  Cardiovascular:     Rate and Rhythm: Normal rate and regular rhythm.     Pulses: Normal pulses.     Heart sounds: Normal heart sounds, S1 normal and S2 normal.  Pulmonary:     Effort: Pulmonary effort is normal.     Breath sounds: Normal breath sounds.  Skin:    General: Skin is warm and dry.  Neurological:     Mental Status: He is alert.     GCS: GCS eye subscore is 4. GCS verbal subscore is 5. GCS motor subscore is 6.  Psychiatric:        Speech: Speech normal.        Behavior: Behavior normal. Behavior is cooperative.     Assessment and Plan:   Perivascular dermatitis Overall stable, per patient Continue gabapentin 400 mg BID Follow-up in 6 months, sooner if concerns  Elevated blood pressure reading Recheck was normal Continue to monitor if feeling symptomatic Follow-up in 6 months, sooner if concerns  Elevated hemoglobin A1c Recheck today He is agreeable to metformin if it is absolutely necessary  I,Savera Zaman,acting as a scribe for [employer], PA.,have documented all relevant documentation on the behalf of Inda Coke, PA,as directed by  Inda Coke, PA while in the presence of Inda Coke, [place].   I, Inda Coke, [place], have reviewed all documentation for this visit. The documentation on 09/23/21 for the exam, diagnosis, procedures, and orders are all accurate and complete.   Inda Coke, PA-C

## 2021-09-23 NOTE — Patient Instructions (Addendum)
It was great to see you!  We will update your blood work today  Continue gabapentin as prescribed -- do not stop this abruptly  Follow-up in 6 months for your physical, sooner if concerns  Please check your blood pressure if you start to feel bad (headaches, woozy, etc)   Take care,  Inda Coke PA-C

## 2021-10-22 ENCOUNTER — Other Ambulatory Visit: Payer: Self-pay | Admitting: Physician Assistant

## 2021-10-22 MED ORDER — GABAPENTIN 400 MG PO CAPS: 400.0000 mg | ORAL_CAPSULE | Freq: Four times a day (QID) | ORAL | 1 refills | Status: DC

## 2021-10-22 NOTE — Telephone Encounter (Signed)
..   Encourage patient to contact the pharmacy for refills or they can request refills through [facility]:  Please schedule appointment if longer than 1 year 09/23/21  NEXT APPOINTMENT DATE:03/31/22  MEDICATION:  gabapentin (NEURONTIN) 400 MG capsule (Expired)  Is the patient out of medication? Yes  PHARMACY: [facility] [place], [place] AT [facility] [place] Phone:  [phone]  Fax:  [phone]      Let patient know to contact pharmacy at the end of the day to make sure medication is ready.  Please notify patient to allow 48-72 hours to process

## 2021-10-22 NOTE — Telephone Encounter (Signed)
Pt requesting refill for Gabapentin 400 mg QID. Last OV 09/23/2021.

## 2021-11-29 ENCOUNTER — Telehealth: Payer: Self-pay | Admitting: Physician Assistant

## 2021-11-29 NOTE — Telephone Encounter (Signed)
Pharmacist, community at [place] Client Site [place] at [place] Night Provider Inda Coke- [place] Contact Type Call Who Is Calling Patient / Member / Family / Caregiver Caller Name D[place]ur Phone Number [phone] Patient Name Edwin Torres Patient DOB 18-May-1955 Call Type Message Only Information Provided Reason for Call Request for General Office Information Initial Comment Caller states he has a medication question, no triage. General information hours provided Additional Comment patient has dry eyes due to a medication and is wanting to try a new medication. General information hours provided Disp. Time Disposition Final User 11/29/2021 7:56:02 AM General Information Provided Yes Domingo Dimes, [place] Call Closed By: Memory Argue Transaction Date/Time: 11/29/2021 7:53:27 AM (ET)

## 2021-11-29 NOTE — Telephone Encounter (Signed)
Left message on voicemail to call office.  

## 2021-11-29 NOTE — Telephone Encounter (Signed)
Pt called back, he said the Patanol 0.1% eye drops is not helping his dry eyes. Pt said he saw and advertisement about a new eye drop called Tyrvava for dry eyes and wants to know if Aldona Bar has heard of this and will order for him? Told pt I will send message to [facility] and get back to you. Pt verbalized understanding.

## 2021-11-29 NOTE — Telephone Encounter (Signed)
Patient requests to be called at ph# [phone] regarding a medication that Patient has questions about for his eyes.

## 2021-11-29 NOTE — Telephone Encounter (Signed)
Samantha please see message.

## 2021-12-02 NOTE — Telephone Encounter (Signed)
Spoke to pt's wife [name], told her Aldona Bar said she does not feel comfortable prescribing this medication. He may consider asking his eye doctor if they would prescribe. [name] verbalized understanding and said she will let pt know.

## 2022-03-31 ENCOUNTER — Ambulatory Visit (INDEPENDENT_AMBULATORY_CARE_PROVIDER_SITE_OTHER): Payer: PPO | Admitting: Physician Assistant

## 2022-03-31 ENCOUNTER — Encounter: Payer: Self-pay | Admitting: Physician Assistant

## 2022-03-31 VITALS — BP 110/62 | HR 77 | Temp 98.4°F | Ht 61.0 in | Wt 155.0 lb

## 2022-03-31 DIAGNOSIS — R7309 Other abnormal glucose: Secondary | ICD-10-CM

## 2022-03-31 DIAGNOSIS — L308 Other specified dermatitis: Secondary | ICD-10-CM

## 2022-03-31 DIAGNOSIS — Z0000 Encounter for general adult medical examination without abnormal findings: Secondary | ICD-10-CM

## 2022-03-31 DIAGNOSIS — Z125 Encounter for screening for malignant neoplasm of prostate: Secondary | ICD-10-CM | POA: Diagnosis not present

## 2022-03-31 DIAGNOSIS — Z720 Tobacco use: Secondary | ICD-10-CM

## 2022-03-31 LAB — COMPREHENSIVE METABOLIC PANEL
ALT: 9 U/L (ref 0–53)
AST: 12 U/L (ref 0–37)
Albumin: 4.1 g/dL (ref 3.5–5.2)
Alkaline Phosphatase: 103 U/L (ref 39–117)
BUN: 4 mg/dL — ABNORMAL LOW (ref 6–23)
CO2: 25 mEq/L (ref 19–32)
Calcium: 9.1 mg/dL (ref 8.4–10.5)
Chloride: 102 mEq/L (ref 96–112)
Creatinine, Ser: 0.75 mg/dL (ref 0.40–1.50)
GFR: 93.06 mL/min (ref >60.00)
Glucose, Bld: 120 mg/dL — ABNORMAL HIGH (ref 70–99)
Potassium: 4.5 mEq/L (ref 3.5–5.1)
Sodium: 136 mEq/L (ref 135–145)
Total Bilirubin: 0.6 mg/dL (ref 0.2–1.2)
Total Protein: 6.5 g/dL (ref 6.0–8.3)

## 2022-03-31 LAB — CBC WITH DIFFERENTIAL/PLATELET
Basophils Absolute: 0.1 10*3/uL (ref 0.0–0.1)
Basophils Relative: 1.3 % (ref 0.0–3.0)
Eosinophils Absolute: 0.3 10*3/uL (ref 0.0–0.7)
Eosinophils Relative: 3.2 % (ref 0.0–5.0)
HCT: 46.8 % (ref 39.0–52.0)
Hemoglobin: 16.5 g/dL (ref 13.0–17.0)
Lymphocytes Relative: 47.4 % — ABNORMAL HIGH (ref 12.0–46.0)
Lymphs Abs: 3.8 10*3/uL (ref 0.7–4.0)
MCHC: 35.2 g/dL (ref 30.0–36.0)
MCV: 95.8 fl (ref 78.0–100.0)
Monocytes Absolute: 0.9 10*3/uL (ref 0.1–1.0)
Monocytes Relative: 11.2 % (ref 3.0–12.0)
Neutro Abs: 3 10*3/uL (ref 1.4–7.7)
Neutrophils Relative %: 36.9 % — ABNORMAL LOW (ref 43.0–77.0)
Platelets: 317 10*3/uL (ref 150.0–400.0)
RBC: 4.89 Mil/uL (ref 4.22–5.81)
RDW: 13.5 % (ref 11.5–15.5)
WBC: 8 10*3/uL (ref 4.0–10.5)

## 2022-03-31 LAB — LIPID PANEL
Cholesterol: 140 mg/dL (ref 0–200)
HDL: 32.5 mg/dL — ABNORMAL LOW (ref >39.00)
LDL Cholesterol: 79 mg/dL (ref 0–99)
NonHDL: 107.48
Total CHOL/HDL Ratio: 4
Triglycerides: 141 mg/dL (ref 0.0–149.0)
VLDL: 28.2 mg/dL (ref 0.0–40.0)

## 2022-03-31 LAB — PSA: PSA: 2.91 ng/mL (ref 0.10–4.00)

## 2022-03-31 LAB — HEMOGLOBIN A1C: Hgb A1c MFr Bld: 6.4 % (ref 4.6–6.5)

## 2022-03-31 NOTE — Patient Instructions (Signed)
It was great to see you! ? ?Please go to the lab for blood work.  ? ?Our office will call you with your results unless you have chosen to receive results via MyChart. ? ?If your blood work is normal we will follow-up each year for physicals and as scheduled for chronic medical problems. ? ?If anything is abnormal we will treat accordingly and get you in for a follow-up. ? ?Take care, ? ?Frazier Balfour ?  ? ? ?

## 2022-03-31 NOTE — Progress Notes (Signed)
Subjective:    Edwin Torres is a 67 y.o. male and is here for a comprehensive physical exam.  HPI  Health Maintenance Due  Topic Date Due   Fecal DNA (Cologuard)  Never done   Lung Cancer Screening  09/13/2020    Acute Concerns: None  Chronic Issues: Neurodermatitis Taking gabapentin 400 mg QID Managing overall well with this Still gets breakthrough itching at times Declines any need for new derm  Elevated A1c Hx of elevated blood sugars in the past. Has never been on medications for this but is agreeable to metformin if necessary   Health Maintenance: Immunizations -- declines shingles and COVID and flu; UTD on PNA Colonoscopy -- declines PSA --  Lab Results  Component Value Date   PSA 2.92 03/26/2021   Diet -- overall stable Sleep habits -- overall stable Exercise -- very little, sometimes walks  Weight -- Weight: 155 lb (70.3 kg)  Recent weight history Wt Readings from Last 10 Encounters:  03/31/22 155 lb (70.3 kg)  09/23/21 156 lb 6.1 oz (70.9 kg)  03/26/21 153 lb 4 oz (69.5 kg)  09/10/20 147 lb 3.2 oz (66.8 kg)  08/16/20 150 lb 12.8 oz (68.4 kg)  01/13/20 137 lb 4 oz (62.3 kg)  11/29/19 132 lb 3.2 oz (60 kg)  11/18/19 130 lb 3.2 oz (59.1 kg)  10/06/19 123 lb (55.8 kg)  10/03/19 124 lb (56.2 kg)   Body mass index is 29.29 kg/m.  Mood -- overall stable Alcohol use --  reports that he does not currently use alcohol.  Tobacco use --  Tobacco Use: High Risk (03/31/2022)   Patient History    Smoking Tobacco Use: Every Day    Smokeless Tobacco Use: Never    Passive Exposure: Not on file    Eligible for Low Dose CT? Yes but refuses  UTD with eye doctor? UTD, possible basal cell carcinoma in left tear duct -- going to see a specialist to address this soon UTD with dentist? UTD     03/31/2022    8:59 AM  Depression screen PHQ 2/9  Decreased Interest 0  Down, Depressed, Hopeless 0  PHQ - 2 Score 0    Other providers/specialists: Patient  Care Team: Inda Coke, [place] as PCP - General (Physician Assistant) Cameron Sprang, MD as Consulting Physician (Neurology) Jonathon Jordan, MD as Consulting Physician (Family Medicine)    PMHx, SurgHx, SocialHx, Medications, and Allergies were reviewed in the Visit Navigator and updated as appropriate.   Past Medical History:  Diagnosis Date   Alcohol addiction ([place])    Allergy    Cancer ([place])    Melanoma on face was removed   Depression    Herpes genitalis in men    History of chicken pox    Seizures ([place])      Past Surgical History:  Procedure Laterality Date   FOOT FRACTURE SURGERY Bilateral    NOSE SURGERY     VASECTOMY       Family History  Problem Relation Age of Onset   Cancer Mother        bladder   Diabetes Mother    Arthritis Mother    Alcohol abuse Father    Arthritis Sister     Social History   Tobacco Use   Smoking status: Every Day    Packs/day: 0.50    Years: 43.00    Total pack years: 21.50    Types: Cigarettes    Start date: 75  Smokeless tobacco: Never  Vaping Use   Vaping Use: Never used  Substance Use Topics   Alcohol use: Not Currently   Drug use: No    Review of Systems:   Review of Systems  Constitutional:  Negative for chills, fever, malaise/fatigue and weight loss.  HENT:  Negative for hearing loss, sinus pain and sore throat.   Respiratory:  Negative for cough and hemoptysis.   Cardiovascular:  Negative for chest pain, palpitations, leg swelling and PND.  Gastrointestinal:  Negative for abdominal pain, constipation, diarrhea, heartburn, nausea and vomiting.  Genitourinary:  Negative for dysuria, frequency and urgency.  Musculoskeletal:  Negative for back pain, myalgias and neck pain.  Skin:  Negative for itching and rash.  Neurological:  Negative for dizziness, tingling, seizures and headaches.  Endo/Heme/Allergies:  Negative for polydipsia.  Psychiatric/Behavioral:  Negative for depression. The patient is not  nervous/anxious.     Objective:    Vitals:   03/31/22 0859  BP: 110/62  Pulse: 77  Temp: 98.4 F (36.9 C)  SpO2: 97%    Body mass index is 29.29 kg/m.  General  Alert, cooperative, no distress, appears stated age  Head:  Normocephalic, without obvious abnormality, atraumatic  Eyes:  PERRL, conjunctiva/corneas clear, EOM's intact, fundi benign, both eyes       Ears:  Normal TM's and external ear canals, both ears  Nose: Nares normal, septum midline, mucosa normal, no drainage or sinus tenderness  Throat: Lips, mucosa, and tongue normal; teeth and gums normal  Neck: Supple, symmetrical, trachea midline, no adenopathy;     thyroid:  No enlargement/tenderness/nodules; no carotid bruit or JVD  Back:   Symmetric, no curvature, ROM normal, no CVA tenderness  Lungs:   Clear to auscultation bilaterally, respirations unlabored  Chest wall:  No tenderness or deformity  Heart:  Regular rate and rhythm, S1 and S2 normal, no murmur, rub or gallop  Abdomen:   Soft, non-tender, bowel sounds active all four quadrants, no masses, no organomegaly  Extremities: Extremities normal, atraumatic, no cyanosis or edema  Prostate : Deferred   Skin: Skin color, texture, turgor normal, no rashes or lesions  Lymph nodes: Cervical, supraclavicular, and axillary nodes normal  Neurologic: CNII-XII grossly intact. Normal strength, sensation and reflexes throughout   AssessmentPlan:   Routine physical examination Today patient counseled on age appropriate routine health concerns for screening and prevention, each reviewed and up to date or declined. Immunizations reviewed and up to date or declined. Labs ordered and reviewed. Risk factors for depression reviewed and negative. Hearing function and visual acuity are intact. ADLs screened and addressed as needed. Functional ability and level of safety reviewed and appropriate. Education, counseling and referrals performed based on assessed risks today. Patient  provided with a copy of personalized plan for preventive services.  *Declines any further vaccines, lung cancer screening, colorectal cancer screening  Tobacco abuse Not ready to quit or discuss quitting  Prostate cancer screening Complete today  Elevated hemoglobin A1c Update A1c and make recommendations   Perivascular dermatitis Overall stable, per patient Refuses need to see dermatology at this time  Inda Coke, PA-C [place]

## 2022-04-01 ENCOUNTER — Other Ambulatory Visit: Payer: Self-pay | Admitting: *Deleted

## 2022-04-01 MED ORDER — PRAVASTATIN SODIUM 10 MG PO TABS: 10.0000 mg | ORAL_TABLET | Freq: Every day | ORAL | 1 refills | Status: DC

## 2022-04-17 ENCOUNTER — Other Ambulatory Visit: Payer: Self-pay | Admitting: Physician Assistant

## 2022-04-17 MED ORDER — GABAPENTIN 400 MG PO CAPS: 400.0000 mg | ORAL_CAPSULE | Freq: Four times a day (QID) | ORAL | 1 refills | Status: DC

## 2022-04-17 NOTE — Telephone Encounter (Signed)
Pt requesting refill for Gabapentin 400 mg. Last OV 04/01/2022.

## 2022-04-17 NOTE — Telephone Encounter (Signed)
..   Encourage patient to contact the pharmacy for refills or they can request refills through [place]:  03/2022   NEXT APPOINTMENT DATE: na   MEDICATION: gabapentin (NEURONTIN) 400 MG capsule   Is the patient out of medication?   [facility]   Let patient know to contact pharmacy at the end of the day to make sure medication is ready.  Please notify patient to allow 48-72 hours to process

## 2022-05-02 ENCOUNTER — Other Ambulatory Visit: Payer: Self-pay | Admitting: Physician Assistant

## 2022-05-02 ENCOUNTER — Telehealth: Payer: Self-pay | Admitting: Physician Assistant

## 2022-05-02 DIAGNOSIS — L308 Other specified dermatitis: Secondary | ICD-10-CM

## 2022-05-02 NOTE — Telephone Encounter (Signed)
Spoke to pt's wife [name], told her Aldona Bar said to let you know that his dermatologist, Hortencia Pilar, is at a new practice in [place]. Name, adderess and phone number given. Told her Aldona Bar has placed a referral for him to reconnect with her. Told her if you do not hear from someone by next week you can contact them. [name] verbalized understanding and will let pt know.

## 2022-05-02 NOTE — Telephone Encounter (Signed)
Please call patient.  Let him and his wife know that his dermatologist, Hortencia Pilar, is at a new practice in [place].  I have placed a referral for him to reconnect with her.  [facility] [address] [place], [place], [place] Phone: [phone]

## 2022-07-10 ENCOUNTER — Ambulatory Visit (INDEPENDENT_AMBULATORY_CARE_PROVIDER_SITE_OTHER): Payer: PPO

## 2022-07-10 VITALS — Wt 155.0 lb

## 2022-07-10 DIAGNOSIS — Z0000 Encounter for general adult medical examination without abnormal findings: Secondary | ICD-10-CM

## 2022-07-10 NOTE — Progress Notes (Signed)
I connected with  Edwin Torres on 07/10/22 by a audio enabled telemedicine application and verified that I am speaking with the correct person using two identifiers.  Patient Location: Home  Provider Location: Office/Clinic  I discussed the limitations of evaluation and management by telemedicine. The patient expressed understanding and agreed to proceed.   Subjective:   Edwin Torres is a 67 y.o. male who presents for Medicare Annual/Subsequent preventive examination.  Review of Systems     Cardiac Risk Factors include: advanced age (>32men, >7 women);male gender;smoking/ tobacco exposure     Objective:    Today's Vitals   07/10/22 0904  Weight: 155 lb (70.3 kg)   Body mass index is 29.29 kg/m.     07/10/2022    9:09 AM 06/27/2021    9:17 AM 06/21/2020    9:39 AM 11/29/2019   10:23 AM 09/08/2019    1:37 PM 05/19/2019    1:15 PM 03/10/2019   12:27 PM  Advanced Directives  Does Patient Have a Medical Advance Directive? No No No No No No Yes  Does patient want to make changes to medical advance directive?       No - Patient declined  Would patient like information on creating a medical advance directive? No - Patient declined No - Patient declined No - Patient declined  No - Patient declined Yes (MAU/Ambulatory/Procedural Areas - Information given)     Current Medications (verified) Outpatient Encounter Medications as of 07/10/2022  Medication Sig   gabapentin (NEURONTIN) 400 MG capsule Take 1 capsule (400 mg total) by mouth 4 (four) times daily.   ibuprofen (ADVIL) 200 MG tablet Take 600 mg by mouth every 6 (six) hours as needed for headache or moderate pain.    loratadine (CLARITIN) 10 MG tablet Take by mouth.   pravastatin (PRAVACHOL) 10 MG tablet Take 1 tablet (10 mg total) by mouth daily.   [DISCONTINUED] CEQUA 0.09 % SOLN Apply 1 drop to eye 2 (two) times daily. (Patient not taking: Reported on 03/31/2022)   No facility-administered encounter medications on file as of  07/10/2022.    Allergies (verified) Penicillins and Ativan [lorazepam]   History: Past Medical History:  Diagnosis Date   Alcohol addiction (HCC)    Allergy    Cancer (HCC)    Melanoma on face was removed   Depression    Herpes genitalis in men    History of chicken pox    Seizures (HCC)    Past Surgical History:  Procedure Laterality Date   FOOT FRACTURE SURGERY Bilateral    NOSE SURGERY     VASECTOMY     Family History  Problem Relation Age of Onset   Cancer Mother        bladder   Diabetes Mother    Arthritis Mother    Alcohol abuse Father    Arthritis Sister    Social History   Socioeconomic History   Marital status: Married    Spouse name: Not on file   Number of children: Not on file   Years of education: Not on file   Highest education level: Not on file  Occupational History   Not on file  Tobacco Use   Smoking status: Every Day    Packs/day: 0.50    Years: 43.00    Additional pack years: 0.00    Total pack years: 21.50    Types: Cigarettes    Start date: 30   Smokeless tobacco: Never  Vaping Use   Vaping  Use: Never used  Substance and Sexual Activity   Alcohol use: Not Currently   Drug use: No   Sexual activity: Yes    Partners: Female    Birth control/protection: None  Other Topics Concern   Not on file  Social History Narrative   2012 fell from roof, disabled since   Right handed    Lives with wife   Social Determinants of Health   Financial Resource Strain: Low Risk  (07/10/2022)   Overall Financial Resource Strain (CARDIA)    Difficulty of Paying Living Expenses: Not hard at all  Food Insecurity: No Food Insecurity (07/10/2022)   Hunger Vital Sign    Worried About Running Out of Food in the Last Year: Never true    Ran Out of Food in the Last Year: Never true  Transportation Needs: No Transportation Needs (07/10/2022)   PRAPARE - Administrator, Civil Service (Medical): No    Lack of Transportation (Non-Medical): No   Physical Activity: Sufficiently Active (07/10/2022)   Exercise Vital Sign    Days of Exercise per Week: 5 days    Minutes of Exercise per Session: 30 min  Stress: No Stress Concern Present (07/10/2022)   [employer] of Occupational Health - Occupational Stress Questionnaire    Feeling of Stress : Not at all  Social Connections: Moderately Isolated (07/10/2022)   Social Connection and Isolation Panel [NHANES]    Frequency of Communication with Friends and Family: Twice a week    Frequency of Social Gatherings with Friends and Family: Twice a week    Attends Religious Services: Never    Diplomatic Services operational officer: No    Attends Engineer, structural: Never    Marital Status: Married    Tobacco Counseling Ready to quit: Not Answered Counseling given: Not Answered   Clinical Intake:  Pre-visit preparation completed: Yes  Pain : No/denies pain     BMI - recorded: 29.29 Nutritional Status: BMI 25 -29 Overweight Nutritional Risks: None Diabetes: No  How often do you need to have someone help you when you read instructions, pamphlets, or other written materials from your doctor or pharmacy?: 1 - Never  Diabetic?no  Interpreter Needed?: No  Information entered by :: Lanier Ensign, LPN   Activities of Daily Living    07/10/2022    9:10 AM  In your present state of health, do you have any difficulty performing the following activities:  Hearing? 0  Vision? 0  Difficulty concentrating or making decisions? 0  Walking or climbing stairs? 0  Dressing or bathing? 0  Doing errands, shopping? 0  Preparing Food and eating ? N  Using the Toilet? N  In the past six months, have you accidently leaked urine? N  Do you have problems with loss of bowel control? N  Managing your Medications? N  Managing your Finances? N  Housekeeping or managing your Housekeeping? N    Patient Care Team: Jarold Motto, [place] as PCP - General (Physician  Assistant) Van Clines, MD as Consulting Physician (Neurology) Mila Palmer, MD as Consulting Physician (Family Medicine)  Indicate any recent Medical Services you may have received from other than [facility] providers in the past year (date may be approximate).     Assessment:   This is a routine wellness examination for Edwin Torres.  Hearing/Vision screen Hearing Screening - Comments:: Pt denies any hearing issues  Vision Screening - Comments:: Pt follows up with Dr Darcel Bayley for annual eye  exams  Dietary issues and exercise activities discussed: Current Exercise Habits: Home exercise routine, Type of exercise: walking, Time (Minutes): 30, Frequency (Times/Week): 5, Weekly Exercise (Minutes/Week): 150   Goals Addressed             This Visit's Progress    Patient Stated       Maintain health        Depression Screen    07/10/2022    9:08 AM 03/31/2022    8:59 AM 06/27/2021    9:16 AM 03/26/2021    9:54 AM 06/21/2020    9:38 AM 05/19/2019    1:17 PM 08/20/2016    5:23 PM  PHQ 2/9 Scores  PHQ - 2 Score 0 0 0 0 0 0 0    Fall Risk    07/10/2022    9:10 AM 06/27/2021    9:18 AM 06/21/2020    9:40 AM 11/29/2019   10:23 AM 05/19/2019    1:17 PM  Fall Risk   Falls in the past year? 0 0 0 1 1  Number falls in past yr: 0 0 0 1 1  Injury with Fall? 0 0 0 1 1  Risk for fall due to : Impaired vision Impaired vision Impaired vision;History of fall(s)  History of fall(s);Impaired mobility  Follow up Falls prevention discussed Falls prevention discussed Falls prevention discussed  Education provided;Falls prevention discussed;Falls evaluation completed    FALL RISK PREVENTION PERTAINING TO THE HOME:  Any stairs in or around the home? No  If so, are there any without handrails? No  Home free of loose throw rugs in walkways, pet beds, electrical cords, etc? Yes  Adequate lighting in your home to reduce risk of falls? Yes   ASSISTIVE DEVICES UTILIZED TO PREVENT FALLS:  Life alert? No   Use of a cane, walker or w/c? No  Grab bars in the bathroom? Yes  Shower chair or bench in shower? Yes  Elevated toilet seat or a handicapped toilet? No   TIMED UP AND GO:  Was the test performed? No .   Cognitive Function:        07/10/2022    9:11 AM 06/27/2021    9:20 AM 05/19/2019    1:17 PM  6CIT Screen  What Year? 0 points 0 points 0 points  What month? 0 points 0 points 0 points  What time? 0 points 0 points 0 points  Count back from 20 0 points 0 points 0 points  Months in reverse 2 points 4 points 0 points  Repeat phrase 0 points 2 points 0 points  Total Score 2 points 6 points 0 points    Immunizations Immunization History  Administered Date(s) Administered   Pneumococcal Conjugate-13 04/04/2019   Pneumococcal Polysaccharide-23 03/26/2021   Tdap 07/11/2016, 02/22/2019    TDAP status: Up to date  Flu Vaccine status: Declined, Education has been provided regarding the importance of this vaccine but patient still declined. Advised may receive this vaccine at local pharmacy or Health Dept. Aware to provide a copy of the vaccination record if obtained from local pharmacy or Health Dept. Verbalized acceptance and understanding.  Pneumococcal vaccine status: Up to date  Covid-19 vaccine status: Declined, Education has been provided regarding the importance of this vaccine but patient still declined. Advised may receive this vaccine at local pharmacy or Health Dept.or vaccine clinic. Aware to provide a copy of the vaccination record if obtained from local pharmacy or Health Dept. Verbalized acceptance and understanding.  Qualifies for Shingles Vaccine?  Yes   Zostavax completed No   Shingrix Completed?: No.    Education has been provided regarding the importance of this vaccine. Patient has been advised to call insurance company to determine out of pocket expense if they have not yet received this vaccine. Advised may also receive vaccine at local pharmacy or Health Dept.  Verbalized acceptance and understanding.  Screening Tests Health Maintenance  Topic Date Due   COVID-19 Vaccine (1) 04/01/2023 (Originally 03/31/1960)   Zoster Vaccines- Shingrix (1 of 2) 04/01/2023 (Originally 03/30/1973)   INFLUENZA VACCINE  05/25/2023 (Originally 09/25/2022)   Lung Cancer Screening  07/10/2023 (Originally 09/13/2020)   Fecal DNA (Cologuard)  07/10/2023 (Originally 03/31/1999)   Medicare Annual Wellness (AWV)  07/10/2023   DTaP/Tdap/Td (3 - Td or Tdap) 02/21/2029   Pneumonia Vaccine 55+ Years old  Completed   Hepatitis C Screening  Completed   HPV VACCINES  Aged Out    Health Maintenance  There are no preventive care reminders to display for this patient.   Pt postponed cologuard   Lung Cancer Screening: (Low Dose CT Chest recommended if Age 39-80 years, 30 pack-year currently smoking OR have quit w/in 15years.) does qualify.   Lung Cancer Screening Referral: pt declined at this time   Additional Screening:  Hepatitis C Screening:  Completed 03/26/21  Vision Screening: Recommended annual ophthalmology exams for early detection of glaucoma and other disorders of the eye. Is the patient up to date with their annual eye exam?  Yes  Who is the provider or what is the name of the office in which the patient attends annual eye exams? Dr Darcel Bayley  If pt is not established with a provider, would they like to be referred to a provider to establish care? No .   Dental Screening: Recommended annual dental exams for proper oral hygiene  Community Resource Referral / Chronic Care Management: CRR required this visit?  No   CCM required this visit?  No      Plan:     I have personally reviewed and noted the following in the patient's chart:   Medical and social history Use of alcohol, tobacco or illicit drugs  Current medications and supplements including opioid prescriptions. Patient is not currently taking opioid prescriptions. Functional ability and  status Nutritional status Physical activity Advanced directives List of other physicians Hospitalizations, surgeries, and ER visits in previous 12 months Vitals Screenings to include cognitive, depression, and falls Referrals and appointments  In addition, I have reviewed and discussed with patient certain preventive protocols, quality metrics, and best practice recommendations. A written personalized care plan for preventive services as well as general preventive health recommendations were provided to patient.     Marzella Schlein, LPN   1/61/0960   Nurse Notes: none

## 2022-07-10 NOTE — Patient Instructions (Signed)
Edwin Torres , Thank you for taking time to come for your Medicare Wellness Visit. I appreciate your ongoing commitment to your health goals. Please review the following plan we discussed and let me know if I can assist you in the future.   These are the goals we discussed:  Goals      Patient Stated     None at this time      Patient Stated     None at this time      Patient Stated     Maintain health         This is a list of the screening recommended for you and due dates:  Health Maintenance  Topic Date Due   COVID-19 Vaccine (1) 04/01/2023*   Zoster (Shingles) Vaccine (1 of 2) 04/01/2023*   Flu Shot  05/25/2023*   Screening for Lung Cancer  07/10/2023*   Cologuard (Stool DNA test)  07/10/2023*   Medicare Annual Wellness Visit  07/10/2023   DTaP/Tdap/Td vaccine (3 - Td or Tdap) 02/21/2029   Pneumonia Vaccine  Completed   Hepatitis C Screening: USPSTF Recommendation to screen - Ages 87-79 yo.  Completed   HPV Vaccine  Aged Out  *Topic was postponed. The date shown is not the original due date.    Advanced directives: Advance directive discussed with you today. Even though you declined this today please call our office should you change your mind and we can give you the proper paperwork for you to fill out.  Conditions/risks identified: maintain health   Next appointment: Follow up in one year for your annual wellness visit.   Preventive Care 10 Years and Older, Male  Preventive care refers to lifestyle choices and visits with your health care provider that can promote health and wellness. What does preventive care include? A yearly physical exam. This is also called an annual well check. Dental exams once or twice a year. Routine eye exams. Ask your health care provider how often you should have your eyes checked. Personal lifestyle choices, including: Daily care of your teeth and gums. Regular physical activity. Eating a healthy diet. Avoiding tobacco and drug  use. Limiting alcohol use. Practicing safe sex. Taking low doses of aspirin every day. Taking vitamin and mineral supplements as recommended by your health care provider. What happens during an annual well check? The services and screenings done by your health care provider during your annual well check will depend on your age, overall health, lifestyle risk factors, and family history of disease. Counseling  Your health care provider may ask you questions about your: Alcohol use. Tobacco use. Drug use. Emotional well-being. Home and relationship well-being. Sexual activity. Eating habits. History of falls. Memory and ability to understand (cognition). Work and work Astronomer. Screening  You may have the following tests or measurements: Height, weight, and BMI. Blood pressure. Lipid and cholesterol levels. These may be checked every 5 years, or more frequently if you are over 47 years old. Skin check. Lung cancer screening. You may have this screening every year starting at age 81 if you have a 30-pack-year history of smoking and currently smoke or have quit within the past 15 years. Fecal occult blood test (FOBT) of the stool. You may have this test every year starting at age 61. Flexible sigmoidoscopy or colonoscopy. You may have a sigmoidoscopy every 5 years or a colonoscopy every 10 years starting at age 6. Prostate cancer screening. Recommendations will vary depending on your family history and other  risks. Hepatitis C blood test. Hepatitis B blood test. Sexually transmitted disease (STD) testing. Diabetes screening. This is done by checking your blood sugar (glucose) after you have not eaten for a while (fasting). You may have this done every 1-3 years. Abdominal aortic aneurysm (AAA) screening. You may need this if you are a current or former smoker. Osteoporosis. You may be screened starting at age 37 if you are at high risk. Talk with your health care provider about  your test results, treatment options, and if necessary, the need for more tests. Vaccines  Your health care provider may recommend certain vaccines, such as: Influenza vaccine. This is recommended every year. Tetanus, diphtheria, and acellular pertussis (Tdap, Td) vaccine. You may need a Td booster every 10 years. Zoster vaccine. You may need this after age 78. Pneumococcal 13-valent conjugate (PCV13) vaccine. One dose is recommended after age 32. Pneumococcal polysaccharide (PPSV23) vaccine. One dose is recommended after age 69. Talk to your health care provider about which screenings and vaccines you need and how often you need them. This information is not intended to replace advice given to you by your health care provider. Make sure you discuss any questions you have with your health care provider. Document Released: 03/09/2015 Document Revised: 10/31/2015 Document Reviewed: 12/12/2014 Elsevier Interactive Patient Education  2017 [place] Prevention in the Home Falls can cause injuries. They can happen to people of all ages. There are many things you can do to make your home safe and to help prevent falls. What can I do on the outside of my home? Regularly fix the edges of walkways and driveways and fix any cracks. Remove anything that might make you trip as you walk through a door, such as a raised step or threshold. Trim any bushes or trees on the path to your home. Use bright outdoor lighting. Clear any walking paths of anything that might make someone trip, such as rocks or tools. Regularly check to see if handrails are loose or broken. Make sure that both sides of any steps have handrails. Any raised decks and porches should have guardrails on the edges. Have any leaves, snow, or ice cleared regularly. Use sand or salt on walking paths during winter. Clean up any spills in your garage right away. This includes oil or grease spills. What can I do in the bathroom? Use  night lights. Install grab bars by the toilet and in the tub and shower. Do not use towel bars as grab bars. Use non-skid mats or decals in the tub or shower. If you need to sit down in the shower, use a plastic, non-slip stool. Keep the floor dry. Clean up any water that spills on the floor as soon as it happens. Remove soap buildup in the tub or shower regularly. Attach bath mats securely with double-sided non-slip rug tape. Do not have throw rugs and other things on the floor that can make you trip. What can I do in the bedroom? Use night lights. Make sure that you have a light by your bed that is easy to reach. Do not use any sheets or blankets that are too big for your bed. They should not hang down onto the floor. Have a firm chair that has side arms. You can use this for support while you get dressed. Do not have throw rugs and other things on the floor that can make you trip. What can I do in the kitchen? Clean up any spills right away.  Avoid walking on wet floors. Keep items that you use a lot in easy-to-reach places. If you need to reach something above you, use a strong step stool that has a grab bar. Keep electrical cords out of the way. Do not use floor polish or wax that makes floors slippery. If you must use wax, use non-skid floor wax. Do not have throw rugs and other things on the floor that can make you trip. What can I do with my stairs? Do not leave any items on the stairs. Make sure that there are handrails on both sides of the stairs and use them. Fix handrails that are broken or loose. Make sure that handrails are as long as the stairways. Check any carpeting to make sure that it is firmly attached to the stairs. Fix any carpet that is loose or worn. Avoid having throw rugs at the top or bottom of the stairs. If you do have throw rugs, attach them to the floor with carpet tape. Make sure that you have a light switch at the top of the stairs and the bottom of the  stairs. If you do not have them, ask someone to add them for you. What else can I do to help prevent falls? Wear shoes that: Do not have high heels. Have rubber bottoms. Are comfortable and fit you well. Are closed at the toe. Do not wear sandals. If you use a stepladder: Make sure that it is fully opened. Do not climb a closed stepladder. Make sure that both sides of the stepladder are locked into place. Ask someone to hold it for you, if possible. Clearly mark and make sure that you can see: Any grab bars or handrails. First and last steps. Where the edge of each step is. Use tools that help you move around (mobility aids) if they are needed. These include: Canes. Walkers. Scooters. Crutches. Turn on the lights when you go into a dark area. Replace any light bulbs as soon as they burn out. Set up your furniture so you have a clear path. Avoid moving your furniture around. If any of your floors are uneven, fix them. If there are any pets around you, be aware of where they are. Review your medicines with your doctor. Some medicines can make you feel dizzy. This can increase your chance of falling. Ask your doctor what other things that you can do to help prevent falls. This information is not intended to replace advice given to you by your health care provider. Make sure you discuss any questions you have with your health care provider. Document Released: 12/07/2008 Document Revised: 07/19/2015 Document Reviewed: 03/17/2014 Elsevier Interactive Patient Education  2017 [employer].

## 2022-08-26 ENCOUNTER — Other Ambulatory Visit: Payer: Self-pay | Admitting: Optometry

## 2022-08-26 DIAGNOSIS — H0233 Blepharochalasis right eye, unspecified eyelid: Secondary | ICD-10-CM

## 2022-08-26 DIAGNOSIS — H532 Diplopia: Secondary | ICD-10-CM

## 2022-08-26 DIAGNOSIS — H02409 Unspecified ptosis of unspecified eyelid: Secondary | ICD-10-CM

## 2022-08-26 DIAGNOSIS — H02401 Unspecified ptosis of right eyelid: Secondary | ICD-10-CM

## 2022-09-18 ENCOUNTER — Ambulatory Visit
Admission: RE | Admit: 2022-09-18 | Discharge: 2022-09-18 | Disposition: A | Discharge status: Home / Self Care | Payer: PPO | Source: Ambulatory Visit | Attending: Optometry | Admitting: Optometry

## 2022-09-18 DIAGNOSIS — H0233 Blepharochalasis right eye, unspecified eyelid: Secondary | ICD-10-CM

## 2022-09-18 DIAGNOSIS — H532 Diplopia: Secondary | ICD-10-CM

## 2022-09-18 DIAGNOSIS — H02409 Unspecified ptosis of unspecified eyelid: Secondary | ICD-10-CM

## 2022-09-18 DIAGNOSIS — H02401 Unspecified ptosis of right eyelid: Secondary | ICD-10-CM

## 2022-09-18 MED ORDER — GADOPICLENOL 0.5 MMOL/ML IV SOLN
7.0000 mL | Freq: Once | INTRAVENOUS | Status: AC | PRN
  Administered 2022-09-18: 7 mL via INTRAVENOUS

## 2022-10-02 ENCOUNTER — Other Ambulatory Visit: Payer: Self-pay | Admitting: Physician Assistant

## 2022-11-10 ENCOUNTER — Other Ambulatory Visit: Payer: Self-pay | Admitting: Physician Assistant

## 2022-11-10 ENCOUNTER — Telehealth: Payer: Self-pay | Admitting: Physician Assistant

## 2022-11-10 MED ORDER — GABAPENTIN 400 MG PO CAPS: 400.0000 mg | ORAL_CAPSULE | Freq: Four times a day (QID) | ORAL | 1 refills | Status: DC

## 2022-11-10 NOTE — Telephone Encounter (Signed)
Spoke to pt's wife told her Rx for Gabpentin was sent to the pharmacy. Mrs. Hanratty verbalized understanding.

## 2022-11-10 NOTE — Telephone Encounter (Signed)
Prescription Request  11/10/2022  LOV: 03/31/2022  What is the name of the medication or equipment?  gabapentin (NEURONTIN) 400 MG capsule    Have you contacted your pharmacy to request a refill? Yes   Which pharmacy would you like this sent to?  [id number] [place], [place] - [address] [place] [address] Phone: [phone] Fax: [phone]    Patient notified that their request is being sent to the clinical staff for review and that they should receive a response within 2 business days.   Please advise at Mobile [phone] (mobile)

## 2022-11-10 NOTE — Telephone Encounter (Signed)
Pt requesting refill for Gabapentin 400 mg 4 times a day. Last OV 03/31/2022.

## 2023-02-09 ENCOUNTER — Other Ambulatory Visit: Payer: Self-pay | Admitting: Physician Assistant

## 2023-04-01 NOTE — Progress Notes (Signed)
 Subjective:    Edwin Torres is a 68 y.o. male and is here for a comprehensive physical exam.  HPI  Chief Complaint  Patient presents with   Annual Exam   Acute Concerns: Weakness of third cranial nerve: States he recently saw another eye surgeon and was told his eye muscles are not working correctly.   He will have more testing done when he follows up with his eye surgeon.   Difficulty sleeping: He endorses difficulty sleeping.  His gabapentin  tends to make his drowsy during the day.  He tends to take naps most days, which makes it difficult to later fall asleep at nighttime.  Pt is interested Amitriptyline  or mirtazapine   Chronic Issues: Neurodermatitis: Pt is on gabapentin  400 mg QID.  He states gabapentin  has bee very helpful.  He is able to tell when he has not taken his gabapentin , sx worsen.   High ASCVD risk score: Stopped taking pravastatin  due to concerns about it increasing his risk for dementia.  Pt is still smoking.   The 10-year ASCVD risk score (Arnett DK, et al., 2019) is: 20.1%   Values used to calculate the score:     Age: 55 years     Sex: Male     Is Non-Hispanic African American: No     Diabetic: No     Tobacco smoker: Yes     Systolic Blood Pressure: 124 mmHg     Is BP treated: No     HDL Cholesterol: 32.5 mg/dL     Total Cholesterol: 140 mg/dL  Abnormal MRI findings Found on MRI of brain/orbits: 7 mm polypoid soft tissue focus at the anterior aspect of the right nasal passage. Direct visualization recommended.   Health Maintenance: Immunizations -- UTD. Colonoscopy -- refuses PSA --  Lab Results  Component Value Date   PSA 2.91 03/31/2022   PSA 2.92 03/26/2021   Diet -- Not on any restrictive diets.  Sleep habits -- Difficulty sleeping (see above).  Exercise -- No intentional exercise.   Weight --  Recent weight history Wt Readings from Last 10 Encounters:  04/02/23 152 lb (68.9 kg)  07/10/22 155 lb (70.3 kg)  03/31/22 155  lb (70.3 kg)  09/23/21 156 lb 6.1 oz (70.9 kg)  03/26/21 153 lb 4 oz (69.5 kg)  09/10/20 147 lb 3.2 oz (66.8 kg)  08/16/20 150 lb 12.8 oz (68.4 kg)  01/13/20 137 lb 4 oz (62.3 kg)  11/29/19 132 lb 3.2 oz (60 kg)  11/18/19 130 lb 3.2 oz (59.1 kg)   Body mass index is 28.72 kg/m.  Mood -- Stable.  Alcohol use --  reports that he does not currently use alcohol.  Tobacco use --  Tobacco Use: High Risk (04/02/2023)   Patient History    Smoking Tobacco Use: Every Day    Smokeless Tobacco Use: Never    Passive Exposure: Not on file    Eligible for Low Dose CT? Yes - refuses  UTD with eye doctor? yes UTD with dentist? no     04/02/2023    9:32 AM  Depression screen PHQ 2/9  Decreased Interest 0  Down, Depressed, Hopeless 0  PHQ - 2 Score 0    Other providers/specialists: Patient Care Team: Job Lukes, [place] as PCP - General (Physician Assistant) Georjean Darice HERO, MD as Consulting Physician (Neurology) Verena Mems, MD as Consulting Physician (Family Medicine)    PMHx, SurgHx, SocialHx, Medications, and Allergies were reviewed in the Visit Navigator and updated as  appropriate.   Past Medical History:  Diagnosis Date   Alcohol addiction (HCC)    Allergy    Cancer (HCC)    Melanoma on face was removed   Depression    Herpes genitalis in men    History of chicken pox    Seizures (HCC)      Past Surgical History:  Procedure Laterality Date   FOOT FRACTURE SURGERY Bilateral    NOSE SURGERY     VASECTOMY       Family History  Problem Relation Age of Onset   Cancer Mother        bladder   Diabetes Mother    Arthritis Mother    Alcohol abuse Father    Parkinson's disease Sister    Healthy Brother     Social History   Tobacco Use   Smoking status: Every Day    Current packs/day: 0.50    Average packs/day: 0.5 packs/day for 45.1 years (22.5 ttl pk-yrs)    Types: Cigarettes    Start date: 1980   Smokeless tobacco: Never  Vaping Use   Vaping status:  Never Used  Substance Use Topics   Alcohol use: Not Currently   Drug use: No    Review of Systems:   Review of Systems  Constitutional:  Negative for chills, fever, malaise/fatigue and weight loss.  HENT:  Negative for hearing loss, sinus pain and sore throat.   Respiratory:  Negative for cough and hemoptysis.   Cardiovascular:  Negative for chest pain, palpitations, leg swelling and PND.  Gastrointestinal:  Negative for abdominal pain, constipation, diarrhea, heartburn, nausea and vomiting.  Genitourinary:  Negative for dysuria, frequency and urgency.  Musculoskeletal:  Negative for back pain, myalgias and neck pain.  Skin:  Negative for itching and rash.  Neurological:  Negative for dizziness, tingling, seizures and headaches.  Endo/Heme/Allergies:  Negative for polydipsia.  Psychiatric/Behavioral:  Negative for depression. The patient is not nervous/anxious.     Objective:    Vitals:   04/02/23 0932  BP: 124/80  Pulse: (!) 59  Temp: (!) 97.4 F (36.3 C)  SpO2: 96%    Body mass index is 28.72 kg/m.  General  Alert, cooperative, no distress, appears stated age  Head:  Normocephalic, without obvious abnormality, atraumatic  Eyes:  PERRL, conjunctiva/corneas clear, EOM's intact, fundi benign, both eyes       Ears:  Normal TM's and external ear canals, both ears  Nose: Nares normal, septum midline, mucosa normal, no drainage or sinus tenderness  Throat: Lips, mucosa, and tongue normal; teeth and gums normal  Neck: Supple, symmetrical, trachea midline, no adenopathy;     thyroid :  No enlargement/tenderness/nodules; no carotid bruit or JVD  Back:   Symmetric, no curvature, ROM normal, no CVA tenderness  Lungs:   Clear to auscultation bilaterally, respirations unlabored  Chest wall:  No tenderness or deformity  Heart:  Regular rate and rhythm, S1 and S2 normal, no murmur, rub or gallop  Abdomen:   Soft, non-tender, bowel sounds active all four quadrants, no masses, no  organomegaly  Extremities: Extremities normal, atraumatic, no cyanosis or edema  Prostate : Deferred   Skin: Skin color, texture, turgor normal, no rashes or lesions  Lymph nodes: Cervical, supraclavicular, and axillary nodes normal  Neurologic: CNII-XII grossly intact. Normal strength, sensation and reflexes throughout   AssessmentPlan:   Routine physical examination Today patient counseled on age appropriate routine health concerns for screening and prevention, each reviewed and up to date or declined.  Immunizations reviewed and up to date or declined. Labs ordered and reviewed. Risk factors for depression reviewed and negative. Hearing function and visual acuity are intact. ADLs screened and addressed as needed. Functional ability and level of safety reviewed and appropriate. Education, counseling and referrals performed based on assessed risks today. Patient provided with a copy of personalized plan for preventive services.  Tobacco abuse Counseled - not interested in quitting  Prostate cancer screening Update PSA today; denies concerns  Elevated hemoglobin A1c Update A1c and provide recommendations  Perivascular dermatitis Controlled with gabapentin  400 mg qid  Continue current regimen  Patient non-compliant, refuses any preventative care measures Declines most cancer screenings despite recommendations  Weakness of right third cranial nerve Ongoing management per specialist  Hyperlipidemia, unspecified hyperlipidemia type Update lipid panel Discussed risks/benefits of statins -- encouraged at least thrice weekly administration of statin  Abnormal finding on MRI of brain Declines ENT referral  I, Vernell Forest, acting as a neurosurgeon for Lucie Buttner, [place]., have documented all relevant documentation on the behalf of Lucie Buttner, [place], as directed by  Lucie Buttner, PA while in the presence of Lucie Buttner, [place].  I, Vernell Forest, have reviewed all documentation for  this visit. The documentation on 04/02/23 for the exam, diagnosis, procedures, and orders are all accurate and complete.  Lucie Buttner, PA-C [facility]

## 2023-04-02 ENCOUNTER — Ambulatory Visit: Payer: PPO | Admitting: Physician Assistant

## 2023-04-02 ENCOUNTER — Encounter: Payer: Self-pay | Admitting: Physician Assistant

## 2023-04-02 VITALS — BP 124/80 | HR 59 | Temp 97.4°F | Ht 61.0 in | Wt 152.0 lb

## 2023-04-02 DIAGNOSIS — Z91199 Patient's noncompliance with other medical treatment and regimen due to unspecified reason: Secondary | ICD-10-CM

## 2023-04-02 DIAGNOSIS — Z720 Tobacco use: Secondary | ICD-10-CM

## 2023-04-02 DIAGNOSIS — L308 Other specified dermatitis: Secondary | ICD-10-CM

## 2023-04-02 DIAGNOSIS — Z125 Encounter for screening for malignant neoplasm of prostate: Secondary | ICD-10-CM | POA: Diagnosis not present

## 2023-04-02 DIAGNOSIS — E785 Hyperlipidemia, unspecified: Secondary | ICD-10-CM | POA: Diagnosis not present

## 2023-04-02 DIAGNOSIS — R7309 Other abnormal glucose: Secondary | ICD-10-CM | POA: Diagnosis not present

## 2023-04-02 DIAGNOSIS — Z0000 Encounter for general adult medical examination without abnormal findings: Secondary | ICD-10-CM | POA: Diagnosis not present

## 2023-04-02 DIAGNOSIS — H4901 Third [oculomotor] nerve palsy, right eye: Secondary | ICD-10-CM | POA: Diagnosis not present

## 2023-04-02 DIAGNOSIS — R9089 Other abnormal findings on diagnostic imaging of central nervous system: Secondary | ICD-10-CM

## 2023-04-02 LAB — COMPREHENSIVE METABOLIC PANEL
ALT: 8 U/L (ref 0–53)
AST: 14 U/L (ref 0–37)
Albumin: 4.2 g/dL (ref 3.5–5.2)
Alkaline Phosphatase: 104 U/L (ref 39–117)
BUN: 5 mg/dL — ABNORMAL LOW (ref 6–23)
CO2: 27 meq/L (ref 19–32)
Calcium: 9.7 mg/dL (ref 8.4–10.5)
Chloride: 101 meq/L (ref 96–112)
Creatinine, Ser: 0.71 mg/dL (ref 0.40–1.50)
GFR: 93.94 mL/min (ref >60.00)
Glucose, Bld: 88 mg/dL (ref 70–99)
Potassium: 4.4 meq/L (ref 3.5–5.1)
Sodium: 137 meq/L (ref 135–145)
Total Bilirubin: 0.5 mg/dL (ref 0.2–1.2)
Total Protein: 6.6 g/dL (ref 6.0–8.3)

## 2023-04-02 LAB — CBC WITH DIFFERENTIAL/PLATELET
Basophils Absolute: 0.1 10*3/uL (ref 0.0–0.1)
Basophils Relative: 0.8 % (ref 0.0–3.0)
Eosinophils Absolute: 0.3 10*3/uL (ref 0.0–0.7)
Eosinophils Relative: 3 % (ref 0.0–5.0)
HCT: 50.1 % (ref 39.0–52.0)
Hemoglobin: 16.8 g/dL (ref 13.0–17.0)
Lymphocytes Relative: 42.5 % (ref 12.0–46.0)
Lymphs Abs: 3.8 10*3/uL (ref 0.7–4.0)
MCHC: 33.6 g/dL (ref 30.0–36.0)
MCV: 98 fL (ref 78.0–100.0)
Monocytes Absolute: 0.9 10*3/uL (ref 0.1–1.0)
Monocytes Relative: 9.6 % (ref 3.0–12.0)
Neutro Abs: 3.9 10*3/uL (ref 1.4–7.7)
Neutrophils Relative %: 44.1 % (ref 43.0–77.0)
Platelets: 307 10*3/uL (ref 150.0–400.0)
RBC: 5.11 Mil/uL (ref 4.22–5.81)
RDW: 13.3 % (ref 11.5–15.5)
WBC: 8.9 10*3/uL (ref 4.0–10.5)

## 2023-04-02 LAB — HEMOGLOBIN A1C: Hgb A1c MFr Bld: 6.5 % (ref 4.6–6.5)

## 2023-04-02 LAB — LIPID PANEL
Cholesterol: 147 mg/dL (ref 0–200)
HDL: 36.2 mg/dL — ABNORMAL LOW (ref >39.00)
LDL Cholesterol: 87 mg/dL (ref 0–99)
NonHDL: 110.82
Total CHOL/HDL Ratio: 4
Triglycerides: 121 mg/dL (ref 0.0–149.0)
VLDL: 24.2 mg/dL (ref 0.0–40.0)

## 2023-04-02 LAB — TSH: TSH: 8.77 u[IU]/mL — ABNORMAL HIGH (ref 0.35–5.50)

## 2023-04-02 LAB — PSA: PSA: 3.04 ng/mL (ref 0.10–4.00)

## 2023-04-02 MED ORDER — MIRTAZAPINE 7.5 MG PO TABS: 7.5000 mg | ORAL_TABLET | Freq: Every day | ORAL | 1 refills | Status: DC

## 2023-04-02 NOTE — Patient Instructions (Addendum)
 It was great to see you!  Please let me know if you would like to see ENT for this: 7 mm polypoid soft tissue focus at the anterior aspect of the right nasal passage. Direct visualization recommended.  Will add Remeron /Mirtazipine 7.5 mg nightly for sleep  Please take statin 3 days a week if you can  Please go to the lab for blood work.   Our office will call you with your results unless you have chosen to receive results via MyChart.  If your blood work is normal we will follow-up each year for physicals and as scheduled for chronic medical problems.  If anything is abnormal we will treat accordingly and get you in for a follow-up.  Take care,  Geran Haithcock

## 2023-04-03 ENCOUNTER — Other Ambulatory Visit: Payer: Self-pay | Admitting: Physician Assistant

## 2023-04-03 DIAGNOSIS — R7989 Other specified abnormal findings of blood chemistry: Secondary | ICD-10-CM

## 2023-04-07 ENCOUNTER — Other Ambulatory Visit: Payer: PPO

## 2023-04-10 ENCOUNTER — Other Ambulatory Visit (INDEPENDENT_AMBULATORY_CARE_PROVIDER_SITE_OTHER): Payer: PPO

## 2023-04-10 DIAGNOSIS — R7989 Other specified abnormal findings of blood chemistry: Secondary | ICD-10-CM

## 2023-04-10 LAB — T4, FREE: Free T4: 0.71 ng/dL (ref 0.60–1.60)

## 2023-04-10 LAB — T3, FREE: T3, Free: 3.4 pg/mL (ref 2.3–4.2)

## 2023-04-10 LAB — TSH: TSH: 4.72 u[IU]/mL (ref 0.35–5.50)

## 2023-05-11 ENCOUNTER — Other Ambulatory Visit: Payer: Self-pay | Admitting: Physician Assistant

## 2023-05-11 NOTE — Telephone Encounter (Signed)
 Clinical Call - Needing refill of Gabapentin  Patient Name First: Edwin Last: Torres Gender: Male DOB: August 10, 1955 Age: 68 Y 1 M 11 D Return Phone Number: [phone] (Primary) Address: City/ State/ Zip: [place]  [address] Client [facility] Night - Human resources officer [facility] [employer] Provider Jarold Motto- [place] Contact Type Call Who Is Calling Patient / Member / Family / Caregiver Call Type Triage / Clinical Caller Name Freeman Borba Caller Phone Number [phone] Relationship To Patient Spouse Return Phone Number [phone] (Primary) Chief Complaint Itching Reason for Call Medication Question / Request Initial Comment Caller states her husband's gabapentin is almost out and he does not have a refill per the pharmacy. He needs it for itching. Translation No Nurse Assessment Nurse: Carylon Perches, RN, Hilda Lias Date/Time [name] Time): 05/09/2023 3:21:28 PM Please select the assessment type ---Refill Additional Documentation ---Caller states her husband needs a refill on Gabapentin. He has enough for the weekend. [facility] [phone]. Spouse is a bit aggravated because she has called about it already and the pharmacy has also contacted the office, but no response. Does the patient have enough medication to last until the office opens? ---Yes Disp. Time [name] Time) Disposition Final User 05/09/2023 2:52:14 PM Send to Clinical Peggye Form, RN, [facility] 05/09/2023 3:27:45 PM Clinical Call Yes Carylon Perches, RN, Hilda Lias Final Disposition 05/09/2023 3:27:45 PM Clinical Call Yes Carylon Perches, RN, Hilda Lias

## 2023-05-11 NOTE — Telephone Encounter (Signed)
 Rx was sent to pharmacy.

## 2023-05-11 NOTE — Telephone Encounter (Signed)
 Copied from CRM [id number]. Topic: Clinical - Medication Refill >> May 11, 2023  8:25 AM Aletta Edouard wrote: Most Recent Primary Care Visit:  Provider: [facility]  Department: [facility]  Visit Type: LAB VISIT  Date: 04/10/2023  Medication: gabapentin 400mg   Has the patient contacted their pharmacy? Yes (Agent: If no, request that the patient contact the pharmacy for the refill. If patient does not wish to contact the pharmacy document the reason why and proceed with request.) (Agent: If yes, when and what did the pharmacy advise?)  Is this the correct pharmacy for this prescription? Yes If no, delete pharmacy and type the correct one.  This is the patient's preferred pharmacy:  [id number] [place], [place] - [address] [place] [address] Phone: [phone] Fax: [phone]   Has the prescription been filled recently? no  Is the patient out of the medication? Yes  Has the patient been seen for an appointment in the last year OR does the patient have an upcoming appointment? Yes  Can we respond through MyChart? No  Agent: Please be advised that Rx refills may take up to 3 business days. We ask that you follow-up with your pharmacy. >> May 11, 2023  8:27 AM Rodman Pickle T wrote: Patient wife would like a call back when this has been done

## 2023-07-16 ENCOUNTER — Ambulatory Visit (INDEPENDENT_AMBULATORY_CARE_PROVIDER_SITE_OTHER): Payer: PPO

## 2023-07-16 ENCOUNTER — Telehealth: Payer: Self-pay | Admitting: Physician Assistant

## 2023-07-16 VITALS — Ht 62.0 in | Wt 152.0 lb

## 2023-07-16 DIAGNOSIS — Z0000 Encounter for general adult medical examination without abnormal findings: Secondary | ICD-10-CM

## 2023-07-16 NOTE — Progress Notes (Signed)
 Subjective:   Edwin Torres is a 68 y.o. who presents for a Medicare Wellness preventive visit.  As a reminder, Annual Wellness Visits don't include a physical exam, and some assessments may be limited, especially if this visit is performed virtually. We may recommend an in-person follow-up visit with your provider if needed.  Visit Complete: Virtual I connected with  Edwin Torres on 07/16/23 by a audio enabled telemedicine application and verified that I am speaking with the correct person using two identifiers.  Patient Location: Home  Provider Location: Office/Clinic  I discussed the limitations of evaluation and management by telemedicine. The patient expressed understanding and agreed to proceed.  Vital Signs: Because this visit was a virtual/telehealth visit, some criteria may be missing or patient reported. Any vitals not documented were not able to be obtained and vitals that have been documented are patient reported.  VideoDeclined- This patient declined Librarian, academic. Therefore the visit was completed with audio only.  Persons Participating in Visit: Patient.  AWV Questionnaire: No: Patient Medicare AWV questionnaire was not completed prior to this visit.  Cardiac Risk Factors include: advanced age (>63men, >32 women);smoking/ tobacco exposure;male gender     Objective:     Today's Vitals   07/16/23 1130  Weight: 152 lb (68.9 kg)  Height: 5\' 2"  (1.575 m)   Body mass index is 27.8 kg/m.     07/16/2023   11:33 AM 07/10/2022    9:09 AM 06/27/2021    9:17 AM 06/21/2020    9:39 AM 11/29/2019   10:23 AM 09/08/2019    1:37 PM 05/19/2019    1:15 PM  Advanced Directives  Does Patient Have a Medical Advance Directive? Yes No No No No No No  Type of Estate agent of [place];Living will        Copy of Healthcare Power of Attorney in Chart? No - copy requested        Would patient like information on creating a  medical advance directive?  No - Patient declined No - Patient declined No - Patient declined  No - Patient declined Yes (MAU/Ambulatory/Procedural Areas - Information given)    Current Medications (verified) Outpatient Encounter Medications as of 07/16/2023  Medication Sig   gabapentin  (NEURONTIN ) 400 MG capsule TAKE 1 CAPSULE (400 MG TOTAL) BY MOUTH 4 (FOUR) TIMES DAILY.   ibuprofen  (ADVIL ) 200 MG tablet Take 600 mg by mouth every 6 (six) hours as needed for headache or moderate pain.    loratadine (CLARITIN) 10 MG tablet Take by mouth.   mirtazapine  (REMERON ) 7.5 MG tablet Take 1 tablet (7.5 mg total) by mouth at bedtime.   pravastatin  (PRAVACHOL ) 10 MG tablet TAKE 1 TABLET BY MOUTH EVERY DAY   No facility-administered encounter medications on file as of 07/16/2023.    Allergies (verified) Penicillins and Ativan  [lorazepam ]   History: Past Medical History:  Diagnosis Date   Alcohol addiction (HCC)    Allergy    Cancer (HCC)    Melanoma on face was removed   Depression    Herpes genitalis in men    History of chicken pox    Seizures (HCC)    Past Surgical History:  Procedure Laterality Date   FOOT FRACTURE SURGERY Bilateral    NOSE SURGERY     VASECTOMY     Family History  Problem Relation Age of Onset   Cancer Mother        bladder   Diabetes Mother  Arthritis Mother    Alcohol abuse Father    Parkinson's disease Sister    Healthy Brother    Social History   Socioeconomic History   Marital status: Married    Spouse name: Not on file   Number of children: Not on file   Years of education: Not on file   Highest education level: Not on file  Occupational History   Not on file  Tobacco Use   Smoking status: Every Day    Current packs/day: 0.50    Average packs/day: 0.5 packs/day for 45.4 years (22.7 ttl pk-yrs)    Types: Cigarettes    Start date: 61   Smokeless tobacco: Never  Vaping Use   Vaping status: Never Used  Substance and Sexual Activity    Alcohol use: Not Currently   Drug use: No   Sexual activity: Yes    Partners: Female    Birth control/protection: None  Other Topics Concern   Not on file  Social History Narrative   2012 fell from roof, disabled since   Right handed    Lives with wife   Social Drivers of Health   Financial Resource Strain: Low Risk  (07/16/2023)   Overall Financial Resource Strain (CARDIA)    Difficulty of Paying Living Expenses: Not hard at all  Food Insecurity: No Food Insecurity (07/16/2023)   Hunger Vital Sign    Worried About Running Out of Food in the Last Year: Never true    Ran Out of Food in the Last Year: Never true  Transportation Needs: No Transportation Needs (07/16/2023)   PRAPARE - Administrator, Civil Service (Medical): No    Lack of Transportation (Non-Medical): No  Physical Activity: Sufficiently Active (07/16/2023)   Exercise Vital Sign    Days of Exercise per Week: 5 days    Minutes of Exercise per Session: 60 min  Stress: No Stress Concern Present (07/16/2023)   [employer] of Occupational Health - Occupational Stress Questionnaire    Feeling of Stress : Not at all  Social Connections: Moderately Isolated (07/16/2023)   Social Connection and Isolation Panel [NHANES]    Frequency of Communication with Friends and Family: More than three times a week    Frequency of Social Gatherings with Friends and Family: More than three times a week    Attends Religious Services: Never    Database administrator or Organizations: No    Attends Engineer, structural: Never    Marital Status: Married    Tobacco Counseling Ready to quit: Not Answered Counseling given: Not Answered    Clinical Intake:  Pre-visit preparation completed: Yes  Pain : No/denies pain     BMI - recorded: 27.8 Nutritional Status: BMI 25 -29 Overweight Nutritional Risks: None Diabetes: No  Lab Results  Component Value Date   HGBA1C 6.5 04/02/2023   HGBA1C 6.4 03/31/2022    HGBA1C 6.5 09/23/2021     How often do you need to have someone help you when you read instructions, pamphlets, or other written materials from your doctor or pharmacy?: 1 - Never  Interpreter Needed?: No  Information entered by :: Lamont Pilsner, LPN   Activities of Daily Living     07/16/2023   11:31 AM  In your present state of health, do you have any difficulty performing the following activities:  Hearing? 0  Vision? 0  Difficulty concentrating or making decisions? 0  Walking or climbing stairs? 0  Dressing or bathing? 0  Doing errands, shopping? 0  Preparing Food and eating ? N  Using the Toilet? N  In the past six months, have you accidently leaked urine? N  Do you have problems with loss of bowel control? N  Managing your Medications? N  Managing your Finances? N  Housekeeping or managing your Housekeeping? N    Patient Care Team: Alexander Iba, [place] as PCP - General (Physician Assistant) Jhonny Moss, MD as Consulting Physician (Neurology) Olin Bertin, MD as Consulting Physician (Family Medicine)  Indicate any recent Medical Services you may have received from other than [facility] providers in the past year (date may be approximate).     Assessment:    This is a routine wellness examination for Edwin Torres.  Hearing/Vision screen Hearing Screening - Comments:: Pt denies any hearing issues  Vision Screening - Comments:: Wears rx glasses - up to date with routine eye exams with [facility]     Goals Addressed             This Visit's Progress    Patient Stated       Being  more mobile        Depression Screen     07/16/2023   11:33 AM 04/02/2023    9:32 AM 07/10/2022    9:08 AM 03/31/2022    8:59 AM 06/27/2021    9:16 AM 03/26/2021    9:54 AM 06/21/2020    9:38 AM  PHQ 2/9 Scores  PHQ - 2 Score 0 0 0 0 0 0 0    Fall Risk     07/16/2023   11:35 AM 07/10/2022    9:10 AM 06/27/2021    9:18 AM 06/21/2020    9:40 AM 11/29/2019   10:23 AM  Fall  Risk   Falls in the past year? 0 0 0 0 1  Number falls in past yr: 0 0 0 0 1  Injury with Fall? 0 0 0 0 1  Risk for fall due to : No Fall Risks Impaired vision Impaired vision Impaired vision;History of fall(s)   Follow up Falls prevention discussed Falls prevention discussed Falls prevention discussed Falls prevention discussed     MEDICARE RISK AT HOME:  Medicare Risk at Home Any stairs in or around the home?: Yes If so, are there any without handrails?: No Home free of loose throw rugs in walkways, pet beds, electrical cords, etc?: Yes Adequate lighting in your home to reduce risk of falls?: Yes Life alert?: No Use of a cane, walker or w/c?: No Grab bars in the bathroom?: Yes Shower chair or bench in shower?: No Elevated toilet seat or a handicapped toilet?: No  TIMED UP AND GO:  Was the test performed?  No  Cognitive Function: 6CIT completed        07/16/2023   11:35 AM 07/10/2022    9:11 AM 06/27/2021    9:20 AM 05/19/2019    1:17 PM  6CIT Screen  What Year? 0 points 0 points 0 points 0 points  What month? 0 points 0 points 0 points 0 points  What time? 0 points 0 points 0 points 0 points  Count back from 20 0 points 0 points 0 points 0 points  Months in reverse 0 points 2 points 4 points 0 points  Repeat phrase 0 points 0 points 2 points 0 points  Total Score 0 points 2 points 6 points 0 points    Immunizations Immunization History  Administered Date(s) Administered  Pneumococcal Conjugate-13 04/04/2019   Pneumococcal Polysaccharide-23 03/26/2021   Tdap 07/11/2016, 02/22/2019    Screening Tests Health Maintenance  Topic Date Due   Fecal DNA (Cologuard)  Never done   Lung Cancer Screening  09/13/2020   INFLUENZA VACCINE  09/25/2023   Medicare Annual Wellness (AWV)  07/15/2024   DTaP/Tdap/Td (3 - Td or Tdap) 02/21/2029   Pneumonia Vaccine 40+ Years old  Completed   Hepatitis C Screening  Completed   HPV VACCINES  Aged Out   Meningococcal B Vaccine  Aged  Out   COVID-19 Vaccine  Discontinued   Zoster Vaccines- Shingrix  Discontinued    Health Maintenance  Health Maintenance Due  Topic Date Due   Fecal DNA (Cologuard)  Never done   Lung Cancer Screening  09/13/2020   Health Maintenance Items Addressed: See Nurse Notes  Additional Screening:  Vision Screening: Recommended annual ophthalmology exams for early detection of glaucoma and other disorders of the eye.  Dental Screening: Recommended annual dental exams for proper oral hygiene  Community Resource Referral / Chronic Care Management: CRR required this visit?  No   CCM required this visit?  No   Plan:    I have personally reviewed and noted the following in the patient's chart:   Medical and social history Use of alcohol, tobacco or illicit drugs  Current medications and supplements including opioid prescriptions. Patient is not currently taking opioid prescriptions. Functional ability and status Nutritional status Physical activity Advanced directives List of other physicians Hospitalizations, surgeries, and ER visits in previous 12 months Vitals Screenings to include cognitive, depression, and falls Referrals and appointments  In addition, I have reviewed and discussed with patient certain preventive protocols, quality metrics, and best practice recommendations. A written personalized care plan for preventive services as well as general preventive health recommendations were provided to patient.   Bruno Capri, LPN   7/82/9562   After Visit Summary: (Declined) Due to this being a telephonic visit, with patients personalized plan was offered to patient but patient Declined AVS at this time   Notes: Nothing significant to report at this time.

## 2023-07-16 NOTE — Patient Instructions (Addendum)
 Mr. Coffield , Thank you for taking time out of your busy schedule to complete your Annual Wellness Visit with me. I enjoyed our conversation and look forward to speaking with you again next year. I, as well as your care team,  appreciate your ongoing commitment to your health goals. Please review the following plan we discussed and let me know if I can assist you in the future. Your Game plan/ To Do List    Referrals: If you haven't heard from the office you've been referred to, please reach out to them at the phone provided.    Follow up Visits: Next Medicare AWV with our clinical staff: 07/28/24   Have you seen your provider in the last 6 months (3 months if uncontrolled diabetes)? Yes Next Office Visit with your provider: 04/06/24  Clinician Recommendations:  Aim for 30 minutes of exercise or brisk walking, 6-8 glasses of water , and 5 servings of fruits and vegetables each day.       This is a list of the screening recommended for you and due dates:  Health Maintenance  Topic Date Due   Cologuard (Stool DNA test)  Never done   Screening for Lung Cancer  09/13/2020   Flu Shot  09/25/2023   Medicare Annual Wellness Visit  07/15/2024   DTaP/Tdap/Td vaccine (3 - Td or Tdap) 02/21/2029   Pneumonia Vaccine  Completed   Hepatitis C Screening  Completed   HPV Vaccine  Aged Out   Meningitis B Vaccine  Aged Out   COVID-19 Vaccine  Discontinued   Zoster (Shingles) Vaccine  Discontinued    Advanced directives: (Copy Requested) Please bring a copy of your health care power of attorney and living will to the office to be added to your chart at your convenience. You can mail to [facility] [address] [place], [place] [address] or email to [email or web address] Advance Care Planning is important because it:  [x]  Makes sure you receive the medical care that is consistent with your values, goals, and preferences  [x]  It provides guidance to your family and loved ones and  reduces their decisional burden about whether or not they are making the right decisions based on your wishes.  Follow the link provided in your after visit summary or read over the paperwork we have mailed to you to help you started getting your Advance Directives in place. If you need assistance in completing these, please reach out to us  so that we can help you!  See attachments for Preventive Care and Fall Prevention Tips.

## 2023-07-16 NOTE — Telephone Encounter (Signed)
 Copied from CRM [id number]. Topic: General - Other >> Jul 16, 2023  8:57 AM Kita Perish H wrote: Reason for CRM: Patients wife Moira Andrews calling they missed phone call from Brian Campanile this morning for patients AWV, if possible can she call back.  [place] [phone]

## 2023-07-23 ENCOUNTER — Ambulatory Visit

## 2023-07-29 DIAGNOSIS — H5021 Vertical strabismus, right eye: Secondary | ICD-10-CM | POA: Insufficient documentation

## 2023-09-02 ENCOUNTER — Encounter: Payer: Self-pay | Admitting: Physician Assistant

## 2023-09-02 ENCOUNTER — Ambulatory Visit (INDEPENDENT_AMBULATORY_CARE_PROVIDER_SITE_OTHER): Admitting: Physician Assistant

## 2023-09-02 VITALS — BP 142/70 | HR 78 | Temp 98.1°F | Ht 62.0 in | Wt 147.4 lb

## 2023-09-02 DIAGNOSIS — R7309 Other abnormal glucose: Secondary | ICD-10-CM | POA: Diagnosis not present

## 2023-09-02 DIAGNOSIS — D492 Neoplasm of unspecified behavior of bone, soft tissue, and skin: Secondary | ICD-10-CM

## 2023-09-02 DIAGNOSIS — R194 Change in bowel habit: Secondary | ICD-10-CM

## 2023-09-02 LAB — CBC WITH DIFFERENTIAL/PLATELET
Basophils Absolute: 0 K/uL (ref 0.0–0.1)
Basophils Relative: 0.7 % (ref 0.0–3.0)
Eosinophils Absolute: 0.2 K/uL (ref 0.0–0.7)
Eosinophils Relative: 3.5 % (ref 0.0–5.0)
HCT: 46.5 % (ref 39.0–52.0)
Hemoglobin: 16.3 g/dL (ref 13.0–17.0)
Lymphocytes Relative: 32.3 % (ref 12.0–46.0)
Lymphs Abs: 2.3 K/uL (ref 0.7–4.0)
MCHC: 35 g/dL (ref 30.0–36.0)
MCV: 92.2 fl (ref 78.0–100.0)
Monocytes Absolute: 0.6 K/uL (ref 0.1–1.0)
Monocytes Relative: 7.9 % (ref 3.0–12.0)
Neutro Abs: 3.9 K/uL (ref 1.4–7.7)
Neutrophils Relative %: 55.6 % (ref 43.0–77.0)
Platelets: 300 K/uL (ref 150.0–400.0)
RBC: 5.05 Mil/uL (ref 4.22–5.81)
RDW: 13.5 % (ref 11.5–15.5)
WBC: 7 K/uL (ref 4.0–10.5)

## 2023-09-02 LAB — COMPREHENSIVE METABOLIC PANEL WITH GFR
ALT: 11 U/L (ref 0–53)
AST: 15 U/L (ref 0–37)
Albumin: 4 g/dL (ref 3.5–5.2)
Alkaline Phosphatase: 95 U/L (ref 39–117)
BUN: 3 mg/dL — ABNORMAL LOW (ref 6–23)
CO2: 23 meq/L (ref 19–32)
Calcium: 9 mg/dL (ref 8.4–10.5)
Chloride: 101 meq/L (ref 96–112)
Creatinine, Ser: 0.61 mg/dL (ref 0.40–1.50)
GFR: 98.06 mL/min (ref >60.00)
Glucose, Bld: 178 mg/dL — ABNORMAL HIGH (ref 70–99)
Potassium: 4 meq/L (ref 3.5–5.1)
Sodium: 131 meq/L — ABNORMAL LOW (ref 135–145)
Total Bilirubin: 0.5 mg/dL (ref 0.2–1.2)
Total Protein: 6.8 g/dL (ref 6.0–8.3)

## 2023-09-02 LAB — TSH: TSH: 3.55 u[IU]/mL (ref 0.35–5.50)

## 2023-09-02 NOTE — Patient Instructions (Signed)
It was great to see you!  To treat your constipation today: -Take 100 mg of docusate sodium (also known as Colace, however generic is fine!) by mouth twice daily to soften stools -Drink at least 64 oz of water daily -Add in 1 capful of polyethylene glycol (also known as Miralax, however generic is fine!) to beverage of choice daily  Goal is to have a formed, soft bowel movement regularly   -After a few days if no success, may increase to a total of 2 capfuls of polyethylene glycol/ Miralax daily  If still no results, please call the office    Take care,  Samantha Worley PA-C  

## 2023-09-02 NOTE — Progress Notes (Signed)
 Edwin Torres is a 68 y.o. male here for a new problem.  History of Present Illness:   Chief Complaint  Patient presents with   Constipation    Pt c/o constipation and bloating x 2 weeks, last bowel movement was Saturday. Denies nausea. Has been using stool softeners.    HPI - Pt is accompanied by his wife.  Bowel habit changes Pt complains of constipation and bloating starting 2 weeks ago.  He reports his last bowel movement was on Saturday and occasionally feels heartburn. He states his typical bowel movements occur at least every 2 days. He also states he ate salad and breadsticks from [employer] and the constipation started afterwards. Endorses taking Colace BID for a few days but not daily, and increasing physical activity. Denies nausea, diarrhea, medication changes, black stools, blood in stools, or treating heartburn. Pt has never had a colonoscopy or Cologuard done.  Abnormal skin lesion He has a small growth on his forehead that he would like evaluated  Past Medical History:  Diagnosis Date   Alcohol addiction (HCC)    Allergy    Cancer (HCC)    Melanoma on face was removed   Depression    Herpes genitalis in men    History of chicken pox    Seizures (HCC)      Social History   Tobacco Use   Smoking status: Every Day    Current packs/day: 0.50    Average packs/day: 0.5 packs/day for 45.5 years (22.8 ttl pk-yrs)    Types: Cigarettes    Start date: 16   Smokeless tobacco: Never  Vaping Use   Vaping status: Never Used  Substance Use Topics   Alcohol use: Not Currently   Drug use: No    Past Surgical History:  Procedure Laterality Date   FOOT FRACTURE SURGERY Bilateral    NOSE SURGERY     VASECTOMY      Family History  Problem Relation Age of Onset   Cancer Mother        bladder   Diabetes Mother    Arthritis Mother    Alcohol abuse Father    Parkinson's disease Sister    Healthy Brother     Allergies  Allergen Reactions    Penicillins Anaphylaxis    Has patient had a PCN reaction causing immediate rash, facial/tongue/throat swelling, SOB or lightheadedness with hypotension: Yes Has patient had a PCN reaction causing severe rash involving mucus membranes or skin necrosis: No Has patient had a PCN reaction that required hospitalization: Yes Has patient had a PCN reaction occurring within the last 10 years: No If all of the above answers are NO, then may proceed with Cephalosporin use.    Ativan  [Lorazepam ] Other (See Comments)    Made him crazy    Current Medications:   Current Outpatient Medications:    gabapentin  (NEURONTIN ) 400 MG capsule, TAKE 1 CAPSULE (400 MG TOTAL) BY MOUTH 4 (FOUR) TIMES DAILY., Disp: 360 capsule, Rfl: 1   ibuprofen  (ADVIL ) 200 MG tablet, Take 600 mg by mouth every 6 (six) hours as needed for headache or moderate pain. , Disp: , Rfl:    loratadine (CLARITIN) 10 MG tablet, Take by mouth., Disp: , Rfl:    pravastatin  (PRAVACHOL ) 10 MG tablet, TAKE 1 TABLET BY MOUTH EVERY DAY, Disp: 90 tablet, Rfl: 0   Review of Systems:   Negative unless otherwise specified per HPI.  Vitals:   Vitals:   09/02/23 1137  BP: (!) 142/70  Pulse: 78  Temp: 98.1 F (36.7 C)  TempSrc: Temporal  SpO2: 97%  Weight: 147 lb 6.1 oz (66.9 kg)  Height: 5' 2 (1.575 m)     Body mass index is 26.96 kg/m.  Physical Exam:   Physical Exam Vitals and nursing note reviewed.  Constitutional:      General: He is not in acute distress.    Appearance: He is well-developed. He is not ill-appearing or toxic-appearing.  Cardiovascular:     Rate and Rhythm: Normal rate and regular rhythm.     Pulses: Normal pulses.     Heart sounds: Normal heart sounds, S1 normal and S2 normal.  Pulmonary:     Effort: Pulmonary effort is normal.     Breath sounds: Normal breath sounds.  Abdominal:     General: Abdomen is flat. Bowel sounds are normal.     Palpations: Abdomen is soft.     Tenderness: There is no  abdominal tenderness. There is no guarding or rebound.  Skin:    General: Skin is warm and dry.     Comments: Raised nodule to right scalp hairline  Neurological:     Mental Status: He is alert.     GCS: GCS eye subscore is 4. GCS verbal subscore is 5. GCS motor subscore is 6.  Psychiatric:        Speech: Speech normal.        Behavior: Behavior normal. Behavior is cooperative.     Assessment and Plan:   1. Bowel habit changes (Primary) - CBC with Differential/Platelet - Comprehensive metabolic panel with GFR - TSH  Unclear etiology I did discuss with him that it would be best to pursue colon cancer screening as he has never had this done and is now having bowel changes -- declines Discussed trialing MiraLAX  1 capful daily with permission to increase to 2 capfuls if ineffective Recommend further evaluation if there is no significant change with this or if any new symptoms develop Will also update blood work for further evaluation  2. Abnormal skin growth - Ambulatory referral to Dermatology   Possible epidermoid cyst Will refer to dermatology for further evaluation and possible excision  I, Lavern Simmers, acting as a Neurosurgeon for [employer], [place]., have documented all relevant documentation on the behalf of Lucie Buttner, [place], as directed by Lucie Buttner, PA while in the presence of Lucie Buttner, [place].  I, Lucie Buttner, [place], have reviewed all documentation for this visit. The documentation on 09/02/23 for the exam, diagnosis, procedures, and orders are all accurate and complete.  Lucie Buttner, PA-C

## 2023-09-04 ENCOUNTER — Ambulatory Visit: Payer: Self-pay | Admitting: Physician Assistant

## 2023-09-04 DIAGNOSIS — R7309 Other abnormal glucose: Secondary | ICD-10-CM

## 2023-09-04 LAB — HEMOGLOBIN A1C: Hgb A1c MFr Bld: 7.5 % — ABNORMAL HIGH (ref 4.6–6.5)

## 2023-09-08 ENCOUNTER — Encounter: Payer: Self-pay | Admitting: Physician Assistant

## 2023-09-08 ENCOUNTER — Ambulatory Visit (INDEPENDENT_AMBULATORY_CARE_PROVIDER_SITE_OTHER): Admitting: Physician Assistant

## 2023-09-08 ENCOUNTER — Other Ambulatory Visit (HOSPITAL_COMMUNITY): Payer: Self-pay

## 2023-09-08 ENCOUNTER — Other Ambulatory Visit (HOSPITAL_BASED_OUTPATIENT_CLINIC_OR_DEPARTMENT_OTHER): Payer: Self-pay

## 2023-09-08 VITALS — BP 142/82 | HR 74 | Temp 98.6°F | Ht 62.0 in | Wt 147.0 lb

## 2023-09-08 DIAGNOSIS — R7309 Other abnormal glucose: Secondary | ICD-10-CM | POA: Diagnosis not present

## 2023-09-08 DIAGNOSIS — F5101 Primary insomnia: Secondary | ICD-10-CM | POA: Diagnosis not present

## 2023-09-08 MED ORDER — TRAZODONE HCL 50 MG PO TABS
25.0000 mg | ORAL_TABLET | Freq: Every evening | ORAL | 3 refills | Status: DC | PRN
  Filled 2023-09-08 – 2023-10-23 (×2): qty 30, 30d supply, fill #0
  Filled 2023-12-02 – 2023-12-03 (×2): qty 30, 30d supply, fill #1
  Filled 2023-12-29: qty 30, 30d supply, fill #2
  Filled 2024-01-27: qty 30, 30d supply, fill #3

## 2023-09-08 NOTE — Progress Notes (Signed)
 Edwin Torres is a 68 y.o. male here for a follow up of a pre-existing problem.  History of Present Illness:   Chief Complaint  Patient presents with   Results    Patient stated no other concerns or questions.     HPI  Elevated glucose This is a new diagnosis. His most recent A1c is 7.5. Patient states that he doesn't typically has a high intake of sugar, so he's unsure to why his A1c is at this level. His diet does consist of occasional snacking, typically has pretzels or muffins.  Lunch typically consist of banana peanut butter sandwiches.   He typically has two 20 oz cans of ginger ale, which he has been doing over the past years.  Does report having coffee with creamer in the morning. Extensive family history of diabetes.   Insomnia  Currently on Gabapentin  400 mg 4x daily. Tolerates this well He states that this reliefs his itchiness but doesn't aid in sleeping.  Was previously on benadryl  years ago but did not aid in sleeping. He was previously on mirtazapine  but states that it often feels groggy in the morning. Wondering if he can try out amitriptyline  as his sister has a good experience with it. Agreeable to trying out trazodone  before amitriptyline .      Past Medical History:  Diagnosis Date   Alcohol addiction (HCC)    Allergy    Cancer (HCC)    Melanoma on face was removed   Depression    Herpes genitalis in men    History of chicken pox    Seizures (HCC)      Social History   Tobacco Use   Smoking status: Every Day    Current packs/day: 0.50    Average packs/day: 0.5 packs/day for 45.5 years (22.8 ttl pk-yrs)    Types: Cigarettes    Start date: 69   Smokeless tobacco: Never  Vaping Use   Vaping status: Never Used  Substance Use Topics   Alcohol use: Not Currently   Drug use: No    Past Surgical History:  Procedure Laterality Date   FOOT FRACTURE SURGERY Bilateral    NOSE SURGERY     VASECTOMY      Family History  Problem Relation Age of  Onset   Cancer Mother        bladder   Diabetes Mother    Arthritis Mother    Alcohol abuse Father    Parkinson's disease Sister    Healthy Brother     Allergies  Allergen Reactions   Penicillins Anaphylaxis    Has patient had a PCN reaction causing immediate rash, facial/tongue/throat swelling, SOB or lightheadedness with hypotension: Yes Has patient had a PCN reaction causing severe rash involving mucus membranes or skin necrosis: No Has patient had a PCN reaction that required hospitalization: Yes Has patient had a PCN reaction occurring within the last 10 years: No If all of the above answers are NO, then may proceed with Cephalosporin use.    Ativan  [Lorazepam ] Other (See Comments)    Made him crazy    Current Medications:   Current Outpatient Medications:    gabapentin  (NEURONTIN ) 400 MG capsule, TAKE 1 CAPSULE (400 MG TOTAL) BY MOUTH 4 (FOUR) TIMES DAILY., Disp: 360 capsule, Rfl: 1   ibuprofen  (ADVIL ) 200 MG tablet, Take 600 mg by mouth every 6 (six) hours as needed for headache or moderate pain. , Disp: , Rfl:    loratadine (CLARITIN) 10 MG tablet, Take by mouth.,  Disp: , Rfl:    pravastatin  (PRAVACHOL ) 10 MG tablet, TAKE 1 TABLET BY MOUTH EVERY DAY, Disp: 90 tablet, Rfl: 0   Review of Systems:   Review of Systems  Psychiatric/Behavioral:  The patient has insomnia.   Negative unless otherwise specified per HPI.  Vitals:   Vitals:   09/08/23 1401  BP: (!) 142/82  Pulse: 74  Temp: 98.6 F (37 C)  TempSrc: Temporal  SpO2: 97%  Weight: 147 lb (66.7 kg)  Height: 5' 2 (1.575 m)     Body mass index is 26.89 kg/m.  Physical Exam:   Physical Exam Vitals and nursing note reviewed.  Constitutional:      Appearance: He is well-developed.  HENT:     Head: Normocephalic.  Eyes:     Conjunctiva/sclera: Conjunctivae normal.     Pupils: Pupils are equal, round, and reactive to light.  Pulmonary:     Effort: Pulmonary effort is normal.  Musculoskeletal:         General: Normal range of motion.     Cervical back: Normal range of motion.  Skin:    General: Skin is warm and dry.  Neurological:     Mental Status: He is alert and oriented to person, place, and time.  Psychiatric:        Behavior: Behavior normal.        Thought Content: Thought content normal.        Judgment: Judgment normal.     Assessment and Plan:   Elevated glucose Reviewed need for more controlled diet with less concentrated sweets We opted to not start medication today Follow up in 3 month(s) and will add on medication if indicated  Primary insomnia Uncontrolled Trial trazodone  25-50 mg daily Follow up in 3 month(s), sooner if concerns   Lucie Buttner, PA-C  I,Safa M Kadhim,acting as a scribe for Lucie Buttner, PA.,have documented all relevant documentation on the behalf of Lucie Buttner, PA,as directed by  Lucie Buttner, PA while in the presence of Lucie Buttner, [place].   I, Lucie Buttner, [place], have reviewed all documentation for this visit. The documentation on 09/08/23 for the exam, diagnosis, procedures, and orders are all accurate and complete.

## 2023-09-18 ENCOUNTER — Other Ambulatory Visit (HOSPITAL_COMMUNITY): Payer: Self-pay

## 2023-09-25 DIAGNOSIS — K859 Acute pancreatitis without necrosis or infection, unspecified: Secondary | ICD-10-CM

## 2023-09-25 HISTORY — DX: Acute pancreatitis without necrosis or infection, unspecified: K85.90

## 2023-10-21 ENCOUNTER — Telehealth: Payer: Self-pay | Admitting: *Deleted

## 2023-10-21 DIAGNOSIS — R194 Change in bowel habit: Secondary | ICD-10-CM

## 2023-10-21 NOTE — Telephone Encounter (Signed)
 Copied from CRM [id number]. Topic: Referral - Request for Referral >> Oct 21, 2023  1:45 PM Mesmerise C wrote: Did the patient discuss referral with their provider in the last year? Yes (If No - schedule appointment) (If Yes - send message)  Appointment offered? No  Type of order/referral and detailed reason for visit: Colonoscopy   Preference of office, provider, location: Dr.Pyrtle [facility] [address] [place] [address] [phone]   If referral order, have you been seen by this specialty before? No (If Yes, this issue or another issue? When? Where?  Can we respond through MyChart? No

## 2023-10-21 NOTE — Telephone Encounter (Signed)
 Spoke to pt's wife Rosaline, told her referral placed for [facility] for patient and someone will contact you to schedule. Rosaline verbalized understanding.

## 2023-10-23 ENCOUNTER — Other Ambulatory Visit (HOSPITAL_COMMUNITY): Payer: Self-pay

## 2023-10-23 ENCOUNTER — Observation Stay (HOSPITAL_COMMUNITY)

## 2023-10-23 ENCOUNTER — Other Ambulatory Visit: Payer: Self-pay

## 2023-10-23 ENCOUNTER — Emergency Department (HOSPITAL_BASED_OUTPATIENT_CLINIC_OR_DEPARTMENT_OTHER)

## 2023-10-23 ENCOUNTER — Encounter (HOSPITAL_BASED_OUTPATIENT_CLINIC_OR_DEPARTMENT_OTHER): Payer: Self-pay

## 2023-10-23 ENCOUNTER — Observation Stay (HOSPITAL_BASED_OUTPATIENT_CLINIC_OR_DEPARTMENT_OTHER)
Admission: EM | Admit: 2023-10-23 | Discharge: 2023-10-24 | Disposition: A | Discharge status: Home / Self Care | Attending: Internal Medicine | Admitting: Internal Medicine

## 2023-10-23 ENCOUNTER — Telehealth: Payer: Self-pay | Admitting: Nurse Practitioner

## 2023-10-23 DIAGNOSIS — K861 Other chronic pancreatitis: Secondary | ICD-10-CM

## 2023-10-23 DIAGNOSIS — R1084 Generalized abdominal pain: Secondary | ICD-10-CM

## 2023-10-23 DIAGNOSIS — R748 Abnormal levels of other serum enzymes: Secondary | ICD-10-CM | POA: Insufficient documentation

## 2023-10-23 DIAGNOSIS — R109 Unspecified abdominal pain: Secondary | ICD-10-CM | POA: Diagnosis present

## 2023-10-23 DIAGNOSIS — E871 Hypo-osmolality and hyponatremia: Secondary | ICD-10-CM | POA: Diagnosis not present

## 2023-10-23 DIAGNOSIS — F17210 Nicotine dependence, cigarettes, uncomplicated: Secondary | ICD-10-CM | POA: Insufficient documentation

## 2023-10-23 DIAGNOSIS — K860 Alcohol-induced chronic pancreatitis: Secondary | ICD-10-CM

## 2023-10-23 DIAGNOSIS — Z85820 Personal history of malignant melanoma of skin: Secondary | ICD-10-CM | POA: Insufficient documentation

## 2023-10-23 DIAGNOSIS — K5900 Constipation, unspecified: Secondary | ICD-10-CM | POA: Diagnosis not present

## 2023-10-23 DIAGNOSIS — R933 Abnormal findings on diagnostic imaging of other parts of digestive tract: Secondary | ICD-10-CM | POA: Diagnosis not present

## 2023-10-23 DIAGNOSIS — Z79899 Other long term (current) drug therapy: Secondary | ICD-10-CM | POA: Insufficient documentation

## 2023-10-23 DIAGNOSIS — K859 Acute pancreatitis without necrosis or infection, unspecified: Principal | ICD-10-CM

## 2023-10-23 DIAGNOSIS — E785 Hyperlipidemia, unspecified: Secondary | ICD-10-CM | POA: Diagnosis not present

## 2023-10-23 DIAGNOSIS — Z720 Tobacco use: Secondary | ICD-10-CM | POA: Diagnosis present

## 2023-10-23 LAB — URINALYSIS, ROUTINE W REFLEX MICROSCOPIC
Bilirubin Urine: NEGATIVE
Glucose, UA: NEGATIVE mg/dL
Hgb urine dipstick: NEGATIVE
Ketones, ur: 15 mg/dL — AB
Leukocytes,Ua: NEGATIVE
Nitrite: NEGATIVE
Protein, ur: NEGATIVE mg/dL
Specific Gravity, Urine: 1.01 (ref 1.005–1.030)
pH: 6.5 (ref 5.0–8.0)

## 2023-10-23 LAB — CBC
HCT: 40.3 % (ref 39.0–52.0)
Hemoglobin: 14.8 g/dL (ref 13.0–17.0)
MCH: 32.7 pg (ref 26.0–34.0)
MCHC: 36.7 g/dL — ABNORMAL HIGH (ref 30.0–36.0)
MCV: 89.2 fL (ref 80.0–100.0)
Platelets: 290 K/uL (ref 150–400)
RBC: 4.52 MIL/uL (ref 4.22–5.81)
RDW: 12.4 % (ref 11.5–15.5)
WBC: 10.3 K/uL (ref 4.0–10.5)
nRBC: 0 % (ref 0.0–0.2)

## 2023-10-23 LAB — COMPREHENSIVE METABOLIC PANEL WITH GFR
ALT: 13 U/L (ref 0–44)
AST: 23 U/L (ref 15–41)
Albumin: 4.2 g/dL (ref 3.5–5.0)
Alkaline Phosphatase: 123 U/L (ref 38–126)
Anion gap: 14 (ref 5–15)
BUN: 6 mg/dL — ABNORMAL LOW (ref 8–23)
CO2: 21 mmol/L — ABNORMAL LOW (ref 22–32)
Calcium: 9.5 mg/dL (ref 8.9–10.3)
Chloride: 93 mmol/L — ABNORMAL LOW (ref 98–111)
Creatinine, Ser: 0.66 mg/dL (ref 0.61–1.24)
GFR, Estimated: >60 mL/min (ref >60)
Glucose, Bld: 181 mg/dL — ABNORMAL HIGH (ref 70–99)
Potassium: 4.4 mmol/L (ref 3.5–5.1)
Sodium: 128 mmol/L — ABNORMAL LOW (ref 135–145)
Total Bilirubin: 0.7 mg/dL (ref 0.0–1.2)
Total Protein: 7 g/dL (ref 6.5–8.1)

## 2023-10-23 LAB — HIV ANTIBODY (ROUTINE TESTING W REFLEX): HIV Screen 4th Generation wRfx: NONREACTIVE

## 2023-10-23 LAB — LIPASE, BLOOD: Lipase: 1148 U/L — ABNORMAL HIGH (ref 11–51)

## 2023-10-23 MED ORDER — IOHEXOL 300 MG/ML  SOLN
100.0000 mL | Freq: Once | INTRAMUSCULAR | Status: AC | PRN
  Administered 2023-10-23: 80 mL via INTRAVENOUS

## 2023-10-23 MED ORDER — NICOTINE 14 MG/24HR TD PT24
14.0000 mg | MEDICATED_PATCH | Freq: Every day | TRANSDERMAL | Status: DC
  Administered 2023-10-23 – 2023-10-24 (×2): 14 mg via TRANSDERMAL
  Filled 2023-10-23 (×2): qty 1

## 2023-10-23 MED ORDER — IBUPROFEN 400 MG PO TABS
600.0000 mg | ORAL_TABLET | Freq: Four times a day (QID) | ORAL | Status: DC | PRN
  Filled 2023-10-23: qty 1

## 2023-10-23 MED ORDER — POLYETHYLENE GLYCOL 3350 17 G PO PACK
17.0000 g | PACK | Freq: Every day | ORAL | Status: DC
  Administered 2023-10-23: 17 g via ORAL
  Filled 2023-10-23 (×2): qty 1

## 2023-10-23 MED ORDER — GADOBUTROL 1 MMOL/ML IV SOLN
6.0000 mL | Freq: Once | INTRAVENOUS | Status: AC | PRN
  Administered 2023-10-23: 6 mL via INTRAVENOUS

## 2023-10-23 MED ORDER — TRAZODONE HCL 50 MG PO TABS: 25.0000 mg | ORAL_TABLET | Freq: Every evening | ORAL | Status: DC | PRN

## 2023-10-23 MED ORDER — SODIUM CHLORIDE 0.9 % IV SOLN: INTRAVENOUS | Status: DC

## 2023-10-23 MED ORDER — OXYCODONE HCL 5 MG PO TABS
5.0000 mg | ORAL_TABLET | ORAL | Status: DC | PRN
  Administered 2023-10-23: 5 mg via ORAL
  Filled 2023-10-23: qty 1

## 2023-10-23 MED ORDER — ENOXAPARIN SODIUM 40 MG/0.4ML IJ SOSY
40.0000 mg | PREFILLED_SYRINGE | INTRAMUSCULAR | Status: DC
  Administered 2023-10-23: 40 mg via SUBCUTANEOUS
  Filled 2023-10-23: qty 0.4

## 2023-10-23 MED ORDER — PRAVASTATIN SODIUM 20 MG PO TABS
10.0000 mg | ORAL_TABLET | Freq: Every day | ORAL | Status: DC
  Administered 2023-10-24: 10 mg via ORAL
  Filled 2023-10-23 (×2): qty 1

## 2023-10-23 MED ORDER — PANTOPRAZOLE SODIUM 40 MG IV SOLR
40.0000 mg | Freq: Two times a day (BID) | INTRAVENOUS | Status: DC
  Administered 2023-10-23 – 2023-10-24 (×2): 40 mg via INTRAVENOUS
  Filled 2023-10-23 (×2): qty 10

## 2023-10-23 MED ORDER — GABAPENTIN 400 MG PO CAPS
400.0000 mg | ORAL_CAPSULE | Freq: Four times a day (QID) | ORAL | Status: DC
Start: 2023-10-23 — End: 2023-10-24
  Administered 2023-10-23 – 2023-10-24 (×4): 400 mg via ORAL
  Filled 2023-10-23 (×4): qty 1

## 2023-10-23 MED ORDER — LORATADINE 10 MG PO TABS: 10.0000 mg | ORAL_TABLET | Freq: Every day | ORAL | Status: DC | PRN

## 2023-10-23 NOTE — Consult Note (Addendum)
 Referring Provider: Dr. Ivonne Mustache Primary Care Physician:  Job Lukes, PA Primary Gastroenterologist:  Sampson Finn PCP  Reason for Consultation: Abdominal pain  HPI: Edwin Torres is a 68 y.o. 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   He developed generalized abdominal pain 6 to 8 weeks ago which has progressively worsened. He presented to the ED earlier today for further evaluation.  Labs in the ED showed a WBC count of 10.3.  Hemoglobin 14.8.  Hematocrit 40.3.  Platelets 298.  Sodium 128.  Potassium 4.4.  BUN 6.  Creatinine 0.66.  Total bili 0.7.  Alk phos 123.  AST 23.  ALT 13.  Lipase 1148.  CTAP showed multiple calcifications in the pancreatic head with mild to ductal dilatation likely representing chronic pancreatitis but ductal stones could not be excluded and mild wall thickening to the gastric antrum and proximal duodenum suggestive of PUD and a 2.3 cystic density versus a duodenal diverticulum noted. An abdominal MRI/MRCP did not show any evidence of cholelithiasis, choledocholithiasis or biliary ductal dilatation but showed inflammation along the second portion of the duodenum and pancreatic head consistent with acute pancreatitis and duodenitis, a cystic structure along the medial wall of the 1st and 2nd portion of the duodenum possibly representing a duodenal diverticulum versus duodenal mural abscess versus pancreatic cyst and mild duct dilatation in the body and tail of the pancreas with type branch ductal ectasia, overall MRI findings consistent with chronic pancreatitis.  He endorses having mid to lower generalized abdominal pain which started around the end of June.  He was seen by his PCP around 09/05/2023 who thought he  likely had abdominal discomfort due to constipation and he was prescribed MiraLAX  daily.  His abdominal pain initially improved on MiraLAX  with increased stool output but after a few weeks his abdominal pain became more frequent.  Since then, he has been passing smaller thinner brown stools most days. He often feels constipated. No bloody or black stools.  No nausea or vomiting.  No GERD symptoms. Decreased appetite the past week without noticeable weight loss.  No prior history of pancreatitis.  Past history of heavy alcohol use, abstinent x 5 years.  He smokes cigarettes 1/2 pack/day since his early 16s.  Denies ever having an EGD or colonoscopy. He takes Ibuprofen  200 mg 3 tabs once daily approximately 20 days monthly for headaches and bodyaches.   Past Medical History:  Diagnosis Date   Alcohol addiction (HCC)    Allergy    Cancer (HCC)    Melanoma on face was removed   Depression    Herpes genitalis in men    History of chicken pox    Seizures (HCC)     Past Surgical History:  Procedure Laterality Date   FOOT FRACTURE SURGERY Bilateral    NOSE SURGERY     VASECTOMY      Prior to Admission medications   Medication Sig Start Date End Date Taking? Authorizing Provider  gabapentin  (NEURONTIN ) 400 MG capsule TAKE 1 CAPSULE (400 MG TOTAL) BY MOUTH 4 (FOUR) TIMES DAILY. 05/11/23 11/07/23  Job Lukes, PA  loratadine  (CLARITIN ) 10 MG tablet Take by mouth.    [provider]  pravastatin  (PRAVACHOL ) 10 MG tablet TAKE 1 TABLET BY MOUTH EVERY DAY 02/09/23   Job Lukes,  PA  traZODone  (DESYREL ) 50 MG tablet Take 0.5-1 tablets (25-50 mg total) by mouth at bedtime as needed for sleep. 09/08/23   Job Lukes, PA    Current Facility-Administered Medications  Medication Dose Route Frequency Provider Last Rate Last Admin   0.9 %  sodium chloride  infusion   Intravenous Continuous Adhikari, Amrit, MD       enoxaparin  (LOVENOX ) injection 40 mg  40 mg Subcutaneous Q24H Adhikari,  Amrit, MD       gabapentin  (NEURONTIN ) capsule 400 mg  400 mg Oral QID Jillian Buttery, MD       loratadine  (CLARITIN ) tablet 10 mg  10 mg Oral Daily PRN Jillian Buttery, MD       oxyCODONE  (Oxy IR/ROXICODONE ) immediate release tablet 5 mg  5 mg Oral Q4H PRN Jillian Buttery, MD       polyethylene glycol (MIRALAX  / GLYCOLAX ) packet 17 g  17 g Oral Daily Adhikari, Amrit, MD       pravastatin  (PRAVACHOL ) tablet 10 mg  10 mg Oral Daily Adhikari, Amrit, MD       traZODone  (DESYREL ) tablet 25-50 mg  25-50 mg Oral QHS PRN Jillian Buttery, MD        Allergies as of 10/23/2023 - Review Complete 10/23/2023  Allergen Reaction Noted   Penicillins Anaphylaxis 03/10/2016   Ativan  [lorazepam ] Other (See Comments) 07/15/2017    Family History  Problem Relation Age of Onset   Cancer Mother        bladder   Diabetes Mother    Arthritis Mother    Alcohol abuse Father    Parkinson's disease Sister    Healthy Brother     Social History   Socioeconomic History   Marital status: Married    Spouse name: Not on file   Number of children: Not on file   Years of education: Not on file   Highest education level: Not on file  Occupational History   Not on file  Tobacco Use   Smoking status: Every Day    Current packs/day: 0.50    Average packs/day: 0.5 packs/day for 45.7 years (22.8 ttl pk-yrs)    Types: Cigarettes    Start date: 71   Smokeless tobacco: Never  Vaping Use   Vaping status: Never Used  Substance and Sexual Activity   Alcohol use: Not Currently   Drug use: No   Sexual activity: Yes    Partners: Female    Birth control/protection: None  Other Topics Concern   Not on file  Social History Narrative   2012 fell from roof, disabled since   Right handed    Lives with wife   Social Drivers of Health   Financial Resource Strain: Low Risk  (07/16/2023)   Overall Financial Resource Strain (CARDIA)    Difficulty of Paying Living Expenses: Not hard at all  Food Insecurity: No Food  Insecurity (07/16/2023)   Hunger Vital Sign    Worried About Running Out of Food in the Last Year: Never true    Ran Out of Food in the Last Year: Never true  Transportation Needs: No Transportation Needs (07/16/2023)   PRAPARE - Administrator, Civil Service (Medical): No    Lack of Transportation (Non-Medical): No  Physical Activity: Sufficiently Active (07/16/2023)   Exercise Vital Sign    Days of Exercise per Week: 5 days    Minutes of Exercise per Session: 60 min  Stress: No Stress Concern Present (07/16/2023)   [employer] of Occupational  Health - Occupational Stress Questionnaire    Feeling of Stress : Not at all  Social Connections: Moderately Isolated (07/16/2023)   Social Connection and Isolation Panel    Frequency of Communication with Friends and Family: More than three times a week    Frequency of Social Gatherings with Friends and Family: More than three times a week    Attends Religious Services: Never    Database administrator or Organizations: No    Attends Banker Meetings: Never    Marital Status: Married  Catering manager Violence: Not At Risk (07/16/2023)   Humiliation, Afraid, Rape, and Kick questionnaire    Fear of Current or Ex-Partner: No    Emotionally Abused: No    Physically Abused: No    Sexually Abused: No   Review of Systems: Gen: Denies fever, sweats or chills. No weight loss.  CV: Denies chest pain, palpitations or edema. Resp: Denies cough, shortness of breath of hemoptysis.  GI: See HPI.    GU : Denies urinary burning, blood in urine, increased urinary frequency or incontinence. MS: Denies joint pain, muscles aches or weakness. Derm: Denies rash, itchiness, skin lesions or unhealing ulcers. Psych: Denies depression, anxiety, memory loss or confusion. Heme: Denies easy bruising, bleeding. Neuro:  + Headaches. Endo:  Denies any problems with DM, thyroid  or adrenal function.  Physical Exam: Vital signs in last 24  hours: Temp:  [97.9 F (36.6 C)-98.1 F (36.7 C)] 97.9 F (36.6 C) (08/29 1402) Pulse Rate:  [65-94] 65 (08/29 1402) Resp:  [16-18] 16 (08/29 1402) BP: (136-164)/(74-84) 136/74 (08/29 1402) SpO2:  [98 %-100 %] 98 % (08/29 1402) Weight:  [65.8 kg] 65.8 kg (08/29 0941)   General: 68 year old male in no acute distress. Head:  Normocephalic and atraumatic. Eyes:  No scleral icterus. Conjunctiva pink. Right eyelid remains mostly closed. Ears:  Normal auditory acuity. Nose:  No deformity, discharge or lesions. Mouth: Poor dentition.  No ulcers or lesions.  Neck:  Supple. No lymphadenopathy or thyromegaly.  Lungs: Breath sounds clear throughout. No wheezes, rhonchi or crackles.  Heart: Regular rate and rhythm, no murmurs. Abdomen: Soft, no significant distention. Mild generalized tenderness without rebound or guarding.  Positive bowel sounds to all 4 quadrants. Rectal: Deferred. Musculoskeletal:  Symmetrical without gross deformities.  Pulses:  Normal pulses noted. Extremities:  Without clubbing or edema. Neurologic:  Alert and  oriented x 4. No focal deficits.  Skin:  Intact without significant lesions or rashes. Psych:  Alert and cooperative. Normal mood and affect.  Intake/Output from previous day: No intake/output data recorded. Intake/Output this shift: No intake/output data recorded.  Lab Results: Recent Labs    10/23/23 1058  WBC 10.3  HGB 14.8  HCT 40.3  PLT 290   BMET Recent Labs    10/23/23 0943  NA 128*  K 4.4  CL 93*  CO2 21*  GLUCOSE 181*  BUN 6*  CREATININE 0.66  CALCIUM  9.5   LFT Recent Labs    10/23/23 0943  PROT 7.0  ALBUMIN 4.2  AST 23  ALT 13  ALKPHOS 123  BILITOT 0.7   PT/INR No results for input(s): LABPROT, INR in the last 72 hours. Hepatitis Panel No results for input(s): HEPBSAG, HCVAB, HEPAIGM, HEPBIGM in the last 72 hours.    Studies/Results: CT ABDOMEN PELVIS W CONTRAST Result Date: 10/23/2023 CLINICAL DATA:   Left lower quadrant abdominal pain for 6 weeks. EXAM: CT ABDOMEN AND PELVIS WITH CONTRAST TECHNIQUE: Multidetector CT imaging of the abdomen  and pelvis was performed using the standard protocol following bolus administration of intravenous contrast. RADIATION DOSE REDUCTION: This exam was performed according to the departmental dose-optimization program which includes automated exposure control, adjustment of the mA and/or kV according to patient size and/or use of iterative reconstruction technique. CONTRAST:  80mL OMNIPAQUE  IOHEXOL  300 MG/ML  SOLN COMPARISON:  None Available. FINDINGS: Lower chest: Mild right posterior basilar scarring is noted. Hepatobiliary: No focal liver abnormality is seen. No gallstones, gallbladder wall thickening, or biliary dilatation. Pancreas: Multiple calcifications are noted in pancreatic head with mild ductal dilatation. This may simply represent chronic pancreatitis, but ductal stones cannot be excluded. MRCP is recommended for further evaluation. Spleen: Normal in size without focal abnormality. Adrenals/Urinary Tract: Adrenal glands are unremarkable. Kidneys are normal, without renal calculi, focal lesion, or hydronephrosis. Bladder is unremarkable. Stomach/Bowel: There appears to be mild wall thickening and possible inflammation involving gastric antrum and proximal duodenum suggesting possible peptic ulcer disease. Endoscopy may be performed for further evaluation. 2.3 cm cystic density is noted in this area which may simply represent duodenal diverticulum. There is no evidence of bowel obstruction. The appendix appears normal. Vascular/Lymphatic: Aortic atherosclerosis. No enlarged abdominal or pelvic lymph nodes. Reproductive: Prostate is unremarkable. Other: Small fat containing left inguinal hernia is noted. No ascites is noted. Musculoskeletal: No acute or significant osseous findings. IMPRESSION: 1. Multiple calcifications are noted in pancreatic head with mild ductal  dilatation. This may simply represent chronic pancreatitis, but ductal stones cannot be excluded. MRCP is recommended for further evaluation. 2. There appears to be mild wall thickening and possible inflammation involving gastric antrum and proximal duodenum suggesting possible peptic ulcer disease. Endoscopy may be performed for further evaluation. 2.3 cm cystic density is noted in this area which may simply represent duodenal diverticulum. 3. Small fat containing left inguinal hernia. 4. Aortic atherosclerosis. Aortic Atherosclerosis (ICD10-I70.0). Electronically Signed   By: Lynwood Landy Raddle M.D.   On: 10/23/2023 11:22    IMPRESSION/PLAN:  68 year old male with abdominal pain x 6 to 8 weeks which has progressively worsened. Labs in the ED showed an elevated lipase level 1,148 with normal LFTs. CTAP showed multiple calcifications in the pancreatic head with mild to ductal dilatation likely representing chronic pancreatitis but ductal stones could not be excluded. Abdominal MR/MRCP did not show any evidence of cholelithiasis, choledocholithiasis or biliary ductal dilatation but showed inflammation along the second portion of the duodenum and pancreatic head consistent with acute pancreatitis and duodenitis, a cystic structure along the medial wall of the 1st and 2nd portion of the duodenum possibly representing a duodenal diverticulum versus duodenal mural abscess versus pancreatic cyst and mild duct dilatation in the body and tail of the pancreas with type branch ductal ectasia, overall MRI findings consistent with chronic pancreatitis.  Abstinent from alcohol x 5 years.  No new medications within the past 6 months. -Clear liquid diet  -IV fluids and pain management per the hospitalist  -CBC, CMP,  lipase level and triglycerides in am -Possible future EUS, likely as an outpatient. Await further recommendations per Dr. Legrand.  CTAP showed mild wall thickening to the gastric antrum and proximal duodenum  suggestive of PUD and a 2.3 cystic density versus a duodenal diverticulum. Chronic NSAID use.  - PPI IV bid - EGD during this admission, timing to be determined   History of alcohol use disorder, abstinent x 5 years   Chronic tobacco use - Smoking cessation recommended  Constipation  - MiraLAX  daily, may increase  to twice daily as needed  Colon cancer screening - Recommend outpatient GI follow up      Elida CHRISTELLA Shawl  10/23/2023, 4:24 PM  I have taken an interval history, thoroughly reviewed the chart and examined the patient. I agree with the Advanced Practitioner's note, impression and recommendations, and have recorded additional findings, impressions and recommendations below. I performed a substantive portion of this encounter (>50% time spent), including a complete performance of the medical decision making.  I also personally reviewed the CT abdomen and pelvis images on this patient.  My additional thoughts are as follows:  Acute on chronic pancreatitis, though cause unclear and patient who has not had alcohol in 5 years, something which his wife who is at the bedside confirms. Given the chronic pancreatitis changes seen on CT scan, it is possible he has a pancreatic duct stone that could precipitate such an episode.  If so, it could then lead to significant adjacent inflammation in the duodenal C-loop to account for the symptoms and imaging findings.  On the other hand, it could also primarily be severe duodenal ulcer disease since the patient also is a heavy NSAID user for his arthritis, and that could be causing adjacent pancreatitis.  Either way, current plan is at least 24 to 48 hours of bowel rest, IV PPI, IV fluids.  I believe he will need an upper endoscopy during this admission, timing TBD based on clinical progress as well as consult service volume and endoscopy/endoscopist availability over the weekend.  Fortunately, no signs of acute GI bleeding.  The  diffuse nature of his abdominal pain is somewhat puzzling and does not quite fit with the imaging findings, but he has also been constipated for about the last 6 weeks, and thus we will start him on some daily MiraLAX .   Dr. Belvie Just will be covering our service for the entire holiday weekend. _________________  This consultation required a high degree of medical decision making due to the nature and complexity of the acute condition(s) being evaluated as well as the patient's medical comorbidities.  Victory LITTIE Brand III [phone]

## 2023-10-23 NOTE — Telephone Encounter (Signed)
 Edwin Torres, patient is currently admitted to [facility], likely will be discharged tonight or this weekend. Pls contact patient and schedule her for a follow up appointment in 2 to 3 weeks with Dr. Leigh of [facility].

## 2023-10-23 NOTE — ED Notes (Signed)
 Taryn with cl called for transport

## 2023-10-23 NOTE — ED Provider Notes (Signed)
 [place] [facility]ERGENCY DEPARTMENT AT [facility] Provider Note   CSN: [id number] Arrival date & time: 10/23/23  9064     Patient presents with: Abdominal Pain   Edwin Torres is a 68 y.o. male.   Patient here with abdominal pain mostly left lower abdominal pain.  Edwin Torres is been constipated for the last several weeks.  Pain here worse recently despite using some MiraLAX  at home.  History of depression seizures alcohol use.  Denies any weakness numbness tingling.  Denies any nausea vomit diarrhea.  Nothing makes worse or better.  Denies chest pain shortness of breath.  The history is provided by the patient.       Prior to Admission medications   Medication Sig Start Date End Date Taking? Authorizing Provider  gabapentin  (NEURONTIN ) 400 MG capsule TAKE 1 CAPSULE (400 MG TOTAL) BY MOUTH 4 (FOUR) TIMES DAILY. 05/11/23 11/07/23  Job Lukes, PA  ibuprofen  (ADVIL ) 200 MG tablet Take 600 mg by mouth every 6 (six) hours as needed for headache or moderate pain.     [provider]  loratadine  (CLARITIN ) 10 MG tablet Take by mouth.    [provider]  pravastatin  (PRAVACHOL ) 10 MG tablet TAKE 1 TABLET BY MOUTH EVERY DAY 02/09/23   Job Lukes, PA  traZODone  (DESYREL ) 50 MG tablet Take 0.5-1 tablets (25-50 mg total) by mouth at bedtime as needed for sleep. 09/08/23   Job Lukes, PA    Allergies: Penicillins and Ativan  [lorazepam ]    Review of Systems  Updated Vital Signs BP (!) 164/84 (BP Location: Right Arm)   Pulse 66   Temp 98.1 F (36.7 C) (Oral)   Resp 18   Ht 5' 3 (1.6 m)   Wt 65.8 kg   SpO2 99%   BMI 25.69 kg/m   Physical Exam Vitals and nursing note reviewed.  Constitutional:      General: Edwin Torres is not in acute distress.    Appearance: Edwin Torres is well-developed.  HENT:     Head: Normocephalic and atraumatic.  Eyes:     Extraocular Movements: Extraocular movements intact.     Conjunctiva/sclera: Conjunctivae normal.     Pupils: Pupils  are equal, round, and reactive to light.  Cardiovascular:     Rate and Rhythm: Normal rate and regular rhythm.     Heart sounds: Normal heart sounds. No murmur heard. Pulmonary:     Effort: Pulmonary effort is normal. No respiratory distress.     Breath sounds: Normal breath sounds.  Abdominal:     Palpations: Abdomen is soft.     Tenderness: There is abdominal tenderness.  Musculoskeletal:        General: No swelling.     Cervical back: Neck supple.  Skin:    General: Skin is warm and dry.     Capillary Refill: Capillary refill takes less than 2 seconds.  Neurological:     Mental Status: Edwin Torres is alert.  Psychiatric:        Mood and Affect: Mood normal.     (all labs ordered are listed, but only abnormal results are displayed) Labs Reviewed  LIPASE, BLOOD - Abnormal; Notable for the following components:      Result Value   Lipase 1,148 (*)    All other components within normal limits  COMPREHENSIVE METABOLIC PANEL WITH GFR - Abnormal; Notable for the following components:   Sodium 128 (*)    Chloride 93 (*)    CO2 21 (*)    Glucose, Bld 181 (*)  BUN 6 (*)    All other components within normal limits  URINALYSIS, ROUTINE W REFLEX MICROSCOPIC - Abnormal; Notable for the following components:   Color, Urine COLORLESS (*)    Ketones, ur 15 (*)    All other components within normal limits  CBC - Abnormal; Notable for the following components:   MCHC 36.7 (*)    All other components within normal limits    EKG: None  Radiology: CT ABDOMEN PELVIS W CONTRAST Result Date: 10/23/2023 CLINICAL DATA:  Left lower quadrant abdominal pain for 6 weeks. EXAM: CT ABDOMEN AND PELVIS WITH CONTRAST TECHNIQUE: Multidetector CT imaging of the abdomen and pelvis was performed using the standard protocol following bolus administration of intravenous contrast. RADIATION DOSE REDUCTION: This exam was performed according to the departmental dose-optimization program which includes automated  exposure control, adjustment of the mA and/or kV according to patient size and/or use of iterative reconstruction technique. CONTRAST:  80mL OMNIPAQUE  IOHEXOL  300 MG/ML  SOLN COMPARISON:  None Available. FINDINGS: Lower chest: Mild right posterior basilar scarring is noted. Hepatobiliary: No focal liver abnormality is seen. No gallstones, gallbladder wall thickening, or biliary dilatation. Pancreas: Multiple calcifications are noted in pancreatic head with mild ductal dilatation. This may simply represent chronic pancreatitis, but ductal stones cannot be excluded. MRCP is recommended for further evaluation. Spleen: Normal in size without focal abnormality. Adrenals/Urinary Tract: Adrenal glands are unremarkable. Kidneys are normal, without renal calculi, focal lesion, or hydronephrosis. Bladder is unremarkable. Stomach/Bowel: There appears to be mild wall thickening and possible inflammation involving gastric antrum and proximal duodenum suggesting possible peptic ulcer disease. Endoscopy may be performed for further evaluation. 2.3 cm cystic density is noted in this area which may simply represent duodenal diverticulum. There is no evidence of bowel obstruction. The appendix appears normal. Vascular/Lymphatic: Aortic atherosclerosis. No enlarged abdominal or pelvic lymph nodes. Reproductive: Prostate is unremarkable. Other: Small fat containing left inguinal hernia is noted. No ascites is noted. Musculoskeletal: No acute or significant osseous findings. IMPRESSION: 1. Multiple calcifications are noted in pancreatic head with mild ductal dilatation. This may simply represent chronic pancreatitis, but ductal stones cannot be excluded. MRCP is recommended for further evaluation. 2. There appears to be mild wall thickening and possible inflammation involving gastric antrum and proximal duodenum suggesting possible peptic ulcer disease. Endoscopy may be performed for further evaluation. 2.3 cm cystic density is noted in  this area which may simply represent duodenal diverticulum. 3. Small fat containing left inguinal hernia. 4. Aortic atherosclerosis. Aortic Atherosclerosis (ICD10-I70.0). Electronically Signed   By: Lynwood Landy Raddle M.D.   On: 10/23/2023 11:22     Procedures   Medications Ordered in the ED  iohexol  (OMNIPAQUE ) 300 MG/ML solution 100 mL (80 mLs Intravenous Contrast Given 10/23/23 1101)                                    Medical Decision Making Amount and/or Complexity of Data Reviewed Labs: ordered. Radiology: ordered.  Risk Prescription drug management. Decision regarding hospitalization.   Edwin Torres is here with abdominal pain.  History of seizure depression and alcohol addiction but has not drank alcohol in 5 years.  Unremarkable vitals.  Abdominal pain worse with eating here the last few weeks worse here recently.  Mostly lower abdominal pain.  Differential diagnosis diverticulitis versus bowel obstruction versus less likely constipation, pancreatitis cholecystitis.  CBC CMP lipase CT abdomen and pelvis ordered.  Pain is well-controlled now.  Edwin Torres has not had anything to eat or drink so pain seems better.  Overall lipase is 1200.  Gallbladder and liver enzymes are normal however.  Pancreatitis versus ductal stones on CT scan abdomen pelvis.  Overall I have talked with Dr. Saintclair with gastroenterology.  Will order MRCP and will admit to hospitalist for further care.  Edwin Torres is hemodynamically stable at this time.  Edwin Torres has no significant leukocytosis anemia or electrolyte abnormality otherwise.  Edwin Torres is not requesting any pain medicine.  Will admit to medicine for further care.  This chart was dictated using voice recognition software.  Despite best efforts to proofread,  errors can occur which can change the documentation meaning.      Final diagnoses:  Acute pancreatitis, unspecified complication status, unspecified pancreatitis type    ED Discharge Orders     None           Ruthe Cornet, DO 10/23/23 1229

## 2023-10-23 NOTE — Plan of Care (Signed)
 Patient is a 68 year old male with history of past alcohol abuse , hyperlipidemia who presented at [facility] with complaint of abdominal pain.  Abdominal pain has been going on for last 6 weeks but started worsening.  No nausea or vomiting.  Also reported constipation for last several days.  Patient states his last alcohol drink was 5 years ago. On condition, he was hemodynamically stable.  Lab work showed sodium of 128, lipase of 1148, liver enzymes are normal.  CT abdomen/pelvis showed multiple calcifications  in pancreatic head with mild ductal dilatation.  Likely features of chronic pancreatitis,but ductal stones cannot be excluded. MRCP  recommended.  CT also showed mild wall thickening and possible inflammation involving gastric antrum and proximal duodenum suggesting possible peptic ulcer disease. Patient being admitted for further management of acute on chronic pancreatitis.  ED physician planning to consult GI as well.  Might need EGD for above finding on CT

## 2023-10-23 NOTE — Plan of Care (Signed)
   Problem: Education: Goal: Knowledge of General Education information will improve Description Including pain rating scale, medication(s)/side effects and non-pharmacologic comfort measures Outcome: Progressing

## 2023-10-23 NOTE — Telephone Encounter (Signed)
 I scheduled patient to see Dr Leigh on 11/16/23 at 2:30 pm.

## 2023-10-23 NOTE — ED Triage Notes (Signed)
 Patient reports abdominal pain for about 6 weeks. He states he went to his PCP and they thought it was constipation and started him on a bowel regimen. He reports it is lower abdominal pain and it is slightly worse on the left side. Denies nausea, vomiting, or diarrhea.

## 2023-10-23 NOTE — H&P (Signed)
 History and Physical    Edwin Torres FMW:[id number] DOB: Apr 18, 1955 DOA: 10/23/2023  PCP: Job Lukes, PA   Patient coming from: Home    Chief Complaint: Abdominal pain  HPI: Edwin Torres is a 68 y.o. male with medical history significant of past alcohol use, hyperlipidemia who presented to [facility] with complaint of abdominal pain.  Abdominal pain has been going on for last 6 weeks but started worsening few days ago.  He reports abdominal pain as a vague discomfort which is generalized but sometimes worse on the left lower quadrant .He denied any no nausea or vomiting.  Also reported constipation for last several days but had 2 small bowel movements yesterday and today.  Reported poor oral intake at home.  He is scared that eating solid food would hurt his abdomen so were just taking clear liquid diet at home. Patient states his last alcohol drink was 5 years ago.  He was adamant that he has not drank any kind of alcohol since last 5 years.  He lives with his wife.  He ambulates without any assistance.  He follows with his PCP on regular basis.  He smokes half packs of cigarettes a day. On presentation, he was hemodynamically stable.  Lab work showed sodium of 128, lipase of 1148, liver enzymes are normal.  CT abdomen/pelvis showed multiple calcifications  in pancreatic head with mild ductal dilatation.  Likely features of chronic pancreatitis,but ductal stones could not be be excluded. MRCP ordered.  CT also showed mild wall thickening and possible inflammation involving gastric antrum and proximal duodenum suggesting possible peptic ulcer disease.  GI consulted. Patient seen and examined at the bedside on the floor.  During my evaluation, he was hemodynamically stable.  Overall comfortable.  Does not look in any kind of distress.  Speaking in full sentences.  No nausea or vomiting during my evaluation.  Denies any fever, chills, chest pain, shortness of breath, cough,  dysuria, hematochezia or melena  ED Course: Remained hemodynamically stable.  Imaging findings as above.  MRCP ordered.  Review of Systems: As per HPI otherwise 10 point review of systems negative.    Past Medical History:  Diagnosis Date   Alcohol addiction (HCC)    Allergy    Cancer (HCC)    Melanoma on face was removed   Depression    Herpes genitalis in men    History of chicken pox    Seizures (HCC)     Past Surgical History:  Procedure Laterality Date   FOOT FRACTURE SURGERY Bilateral    NOSE SURGERY     VASECTOMY       reports that he has been smoking cigarettes. He started smoking about 45 years ago. He has a 22.8 pack-year smoking history. He has never used smokeless tobacco. He reports that he does not currently use alcohol. He reports that he does not use drugs.  Allergies  Allergen Reactions   Penicillins Anaphylaxis    Has patient had a PCN reaction causing immediate rash, facial/tongue/throat swelling, SOB or lightheadedness with hypotension: Yes Has patient had a PCN reaction causing severe rash involving mucus membranes or skin necrosis: No Has patient had a PCN reaction that required hospitalization: Yes Has patient had a PCN reaction occurring within the last 10 years: No If all of the above answers are NO, then may proceed with Cephalosporin use.    Ativan  [Lorazepam ] Other (See Comments)    Made him crazy    Family History  Problem Relation Age of Onset   Cancer Mother        bladder   Diabetes Mother    Arthritis Mother    Alcohol abuse Father    Parkinson's disease Sister    Healthy Brother      Prior to Admission medications   Medication Sig Start Date End Date Taking? Authorizing Provider  gabapentin  (NEURONTIN ) 400 MG capsule TAKE 1 CAPSULE (400 MG TOTAL) BY MOUTH 4 (FOUR) TIMES DAILY. 05/11/23 11/07/23  Job Lukes, PA  loratadine  (CLARITIN ) 10 MG tablet Take by mouth.    [provider]  pravastatin  (PRAVACHOL ) 10 MG  tablet TAKE 1 TABLET BY MOUTH EVERY DAY 02/09/23   Job Lukes, PA  traZODone  (DESYREL ) 50 MG tablet Take 0.5-1 tablets (25-50 mg total) by mouth at bedtime as needed for sleep. 09/08/23   Job Lukes, PA    Physical Exam: Vitals:   10/23/23 0940 10/23/23 0941 10/23/23 1100 10/23/23 1402  BP: (!) 164/84   136/74  Pulse: 94  66 65  Resp: 18   16  Temp: 98.1 F (36.7 C)   97.9 F (36.6 C)  TempSrc: Oral   Oral  SpO2: 100%  99% 98%  Weight:  65.8 kg    Height:  5' 3 (1.6 m)      Constitutional: NAD, calm, comfortable Vitals:   10/23/23 0940 10/23/23 0941 10/23/23 1100 10/23/23 1402  BP: (!) 164/84   136/74  Pulse: 94  66 65  Resp: 18   16  Temp: 98.1 F (36.7 C)   97.9 F (36.6 C)  TempSrc: Oral   Oral  SpO2: 100%  99% 98%  Weight:  65.8 kg    Height:  5' 3 (1.6 m)     Eyes: PERRL, lids and conjunctivae normal ENMT: Mucous membranes are moist.  Neck: normal, supple, no masses, no thyromegaly Respiratory: clear to auscultation bilaterally, no wheezing, no crackles. Normal respiratory effort. No accessory muscle use.  Cardiovascular: Regular rate and rhythm, no murmurs / rubs / gallops. No extremity edema.  Abdomen: soft,non distended, no masses palpated. No hepatosplenomegaly. Bowel sounds positive.  Very mild generalized tenderness Musculoskeletal: no clubbing / cyanosis. No joint deformity upper and lower extremities.  Skin: no rashes, lesions, ulcers. No induration Neurologic: CN 2-12 grossly intact.  Strength 5/5 in all 4.  Psychiatric: Normal judgment and insight. Alert and oriented x 3. Normal mood.   Foley Catheter:None  Labs on Admission: I have personally reviewed following labs and imaging studies  CBC: Recent Labs  Lab 10/23/23 1058  WBC 10.3  HGB 14.8  HCT 40.3  MCV 89.2  PLT 290   Basic Metabolic Panel: Recent Labs  Lab 10/23/23 0943  NA 128*  K 4.4  CL 93*  CO2 21*  GLUCOSE 181*  BUN 6*  CREATININE 0.66  CALCIUM  9.5    GFR: Estimated Creatinine Clearance: 70.1 mL/min (by C-G formula based on SCr of 0.66 mg/dL). Liver Function Tests: Recent Labs  Lab 10/23/23 0943  AST 23  ALT 13  ALKPHOS 123  BILITOT 0.7  PROT 7.0  ALBUMIN 4.2   Recent Labs  Lab 10/23/23 0943  LIPASE 1,148*   No results for input(s): AMMONIA in the last 168 hours. Coagulation Profile: No results for input(s): INR, PROTIME in the last 168 hours. Cardiac Enzymes: No results for input(s): CKTOTAL, CKMB, CKMBINDEX, TROPONINI in the last 168 hours. BNP (last 3 results) No results for input(s): PROBNP in the last 8760 hours. HbA1C: No  results for input(s): HGBA1C in the last 72 hours. CBG: No results for input(s): GLUCAP in the last 168 hours. Lipid Profile: No results for input(s): CHOL, HDL, LDLCALC, TRIG, CHOLHDL, LDLDIRECT in the last 72 hours. Thyroid  Function Tests: No results for input(s): TSH, T4TOTAL, FREET4, T3FREE, THYROIDAB in the last 72 hours. Anemia Panel: No results for input(s): VITAMINB12, FOLATE, FERRITIN, TIBC, IRON, RETICCTPCT in the last 72 hours. Urine analysis:    Component Value Date/Time   COLORURINE COLORLESS (A) 10/23/2023 1130   APPEARANCEUR CLEAR 10/23/2023 1130   LABSPEC 1.010 10/23/2023 1130   PHURINE 6.5 10/23/2023 1130   GLUCOSEU NEGATIVE 10/23/2023 1130   HGBUR NEGATIVE 10/23/2023 1130   BILIRUBINUR NEGATIVE 10/23/2023 1130   KETONESUR 15 (A) 10/23/2023 1130   PROTEINUR NEGATIVE 10/23/2023 1130   NITRITE NEGATIVE 10/23/2023 1130   LEUKOCYTESUR NEGATIVE 10/23/2023 1130    Radiological Exams on Admission: CT ABDOMEN PELVIS W CONTRAST Result Date: 10/23/2023 CLINICAL DATA:  Left lower quadrant abdominal pain for 6 weeks. EXAM: CT ABDOMEN AND PELVIS WITH CONTRAST TECHNIQUE: Multidetector CT imaging of the abdomen and pelvis was performed using the standard protocol following bolus administration of intravenous contrast. RADIATION  DOSE REDUCTION: This exam was performed according to the departmental dose-optimization program which includes automated exposure control, adjustment of the mA and/or kV according to patient size and/or use of iterative reconstruction technique. CONTRAST:  80mL OMNIPAQUE  IOHEXOL  300 MG/ML  SOLN COMPARISON:  None Available. FINDINGS: Lower chest: Mild right posterior basilar scarring is noted. Hepatobiliary: No focal liver abnormality is seen. No gallstones, gallbladder wall thickening, or biliary dilatation. Pancreas: Multiple calcifications are noted in pancreatic head with mild ductal dilatation. This may simply represent chronic pancreatitis, but ductal stones cannot be excluded. MRCP is recommended for further evaluation. Spleen: Normal in size without focal abnormality. Adrenals/Urinary Tract: Adrenal glands are unremarkable. Kidneys are normal, without renal calculi, focal lesion, or hydronephrosis. Bladder is unremarkable. Stomach/Bowel: There appears to be mild wall thickening and possible inflammation involving gastric antrum and proximal duodenum suggesting possible peptic ulcer disease. Endoscopy may be performed for further evaluation. 2.3 cm cystic density is noted in this area which may simply represent duodenal diverticulum. There is no evidence of bowel obstruction. The appendix appears normal. Vascular/Lymphatic: Aortic atherosclerosis. No enlarged abdominal or pelvic lymph nodes. Reproductive: Prostate is unremarkable. Other: Small fat containing left inguinal hernia is noted. No ascites is noted. Musculoskeletal: No acute or significant osseous findings. IMPRESSION: 1. Multiple calcifications are noted in pancreatic head with mild ductal dilatation. This may simply represent chronic pancreatitis, but ductal stones cannot be excluded. MRCP is recommended for further evaluation. 2. There appears to be mild wall thickening and possible inflammation involving gastric antrum and proximal duodenum  suggesting possible peptic ulcer disease. Endoscopy may be performed for further evaluation. 2.3 cm cystic density is noted in this area which may simply represent duodenal diverticulum. 3. Small fat containing left inguinal hernia. 4. Aortic atherosclerosis. Aortic Atherosclerosis (ICD10-I70.0). Electronically Signed   By: Lynwood Landy Raddle M.D.   On: 10/23/2023 11:22     Assessment/Plan Principal Problem:   Acute on chronic pancreatitis [facility]) Active Problems:   Tobacco abuse   Abdominal pain   Elevated lipase   Hyponatremia  Acute on chronic pancreatitis/abdominal pain/elevated lipase: CT abdomen/pelvis showed features of chronic pancreatitis with calcifications.  History of partial alcohol abuse, adamantly declines current alcohol use. Complains of vague abdominal discomfort.  No localized tenderness. Continue clear liquid diet for now.  Continue IV fluid.  Elevated lipase. MRCP ordered for further because ductal stones could not be excluded.  Liver enzymes are normal along with alkaline phosphatase. [facility] will follow. Continue clear liquid diet for now.  Abdomen not distended, has good bowel sounds  Hyponatremia: Likely from poor oral intake.  Patient says he was just taking liquid diet at home.  Continue IV fluid  Tobacco abuse: Smokes half pack a day.  Counseled cessation.  Ordered nicotine  patch  History of hyperlipidemia: On pravastatin  at home          Severity of Illness: The appropriate patient status for this patient is OBSERVATION.  DVT prophylaxis: Lovenox  Code Status: Full Family Communication: None at bedside Consults called: GI     Ivonne Mustache MD [facility]  10/23/2023, 2:22 PM

## 2023-10-24 ENCOUNTER — Other Ambulatory Visit (HOSPITAL_COMMUNITY): Payer: Self-pay

## 2023-10-24 DIAGNOSIS — K861 Other chronic pancreatitis: Secondary | ICD-10-CM | POA: Diagnosis not present

## 2023-10-24 DIAGNOSIS — K859 Acute pancreatitis without necrosis or infection, unspecified: Secondary | ICD-10-CM | POA: Diagnosis not present

## 2023-10-24 LAB — COMPREHENSIVE METABOLIC PANEL WITH GFR
ALT: 9 U/L (ref 0–44)
AST: 13 U/L — ABNORMAL LOW (ref 15–41)
Albumin: 3.7 g/dL (ref 3.5–5.0)
Alkaline Phosphatase: 103 U/L (ref 38–126)
Anion gap: 11 (ref 5–15)
BUN: <5 mg/dL — ABNORMAL LOW (ref 8–23)
CO2: 22 mmol/L (ref 22–32)
Calcium: 8.8 mg/dL — ABNORMAL LOW (ref 8.9–10.3)
Chloride: 102 mmol/L (ref 98–111)
Creatinine, Ser: 0.56 mg/dL — ABNORMAL LOW (ref 0.61–1.24)
GFR, Estimated: >60 mL/min (ref >60)
Glucose, Bld: 76 mg/dL (ref 70–99)
Potassium: 4.7 mmol/L (ref 3.5–5.1)
Sodium: 135 mmol/L (ref 135–145)
Total Bilirubin: 0.7 mg/dL (ref 0.0–1.2)
Total Protein: 6.1 g/dL — ABNORMAL LOW (ref 6.5–8.1)

## 2023-10-24 LAB — CBC
HCT: 45.2 % (ref 39.0–52.0)
Hemoglobin: 15.5 g/dL (ref 13.0–17.0)
MCH: 32 pg (ref 26.0–34.0)
MCHC: 34.3 g/dL (ref 30.0–36.0)
MCV: 93.4 fL (ref 80.0–100.0)
Platelets: 324 K/uL (ref 150–400)
RBC: 4.84 MIL/uL (ref 4.22–5.81)
RDW: 12.7 % (ref 11.5–15.5)
WBC: 9.4 K/uL (ref 4.0–10.5)
nRBC: 0 % (ref 0.0–0.2)

## 2023-10-24 LAB — TRIGLYCERIDES: Triglycerides: 90 mg/dL (ref <150)

## 2023-10-24 LAB — LIPASE, BLOOD: Lipase: 724 U/L — ABNORMAL HIGH (ref 11–51)

## 2023-10-24 MED ORDER — POLYETHYLENE GLYCOL 3350 17 G PO PACK: 17.0000 g | PACK | Freq: Every day | ORAL | Status: AC

## 2023-10-24 MED ORDER — PANTOPRAZOLE SODIUM 40 MG PO TBEC
40.0000 mg | DELAYED_RELEASE_TABLET | Freq: Every day | ORAL | 1 refills | Status: DC
  Filled 2023-10-24: qty 30, 30d supply, fill #0

## 2023-10-24 MED ORDER — IBUPROFEN 400 MG PO TABS
400.0000 mg | ORAL_TABLET | Freq: Once | ORAL | Status: AC
  Administered 2023-10-24: 400 mg via ORAL

## 2023-10-24 NOTE — Plan of Care (Signed)
  Problem: Clinical Measurements: Goal: Will remain free from infection Outcome: Progressing   Problem: Coping: Goal: Level of anxiety will decrease Outcome: Progressing   

## 2023-10-24 NOTE — Discharge Summary (Addendum)
 Physician Discharge Summary  Edwin Torres FMW:[id number] DOB: 05-07-1955 DOA: 10/23/2023  PCP: Job Lukes, PA  Admit date: 10/23/2023 Discharge date: 10/24/2023  Admitted From: Home  Discharge disposition: Home   Recommendations for Outpatient Follow-Up:   Follow up with your primary care provider in one week.  Check CBC, BMP, magnesium  in the next visit Follow-up with GI as outpatient, has been scheduled.  Discharge Diagnosis:   Principal Problem:   Acute on chronic pancreatitis (HCC) Active Problems:   Tobacco abuse   Abdominal pain   Elevated lipase   Hyponatremia  Discharge Condition: Improved.  Diet recommendation: Low-fat diet.  Wound care: None.  Code status: Full.   History of Present Illness:    Edwin Torres is a 68 y.o. male with medical history significant of past alcohol use, hyperlipidemia presented to hospital with abdominal pain for 6 weeks, vague discomfort which is generalized but sometimes worse on the left lower quadrant .In the ED, patient was hemodynamically stable.  Labs were notable for sodium of 128, lipase of 1148.    CT abdomen/pelvis showed multiple calcifications  in pancreatic head with mild ductal dilatation.  Likely features of chronic pancreatitis,but ductal stones could not be be excluded.  There was also wall thickening and possible inflammation involving gastric antrum and proximal duodenum suggesting possible peptic ulcer disease.  GI was consulted and patient was admitted hospital for further evaluation and treatment.   Hospital Course:   Following conditions were addressed during hospitalization as listed below,  Acute on chronic pancreatitis  CT abdomen/pelvis showed features of chronic pancreatitis with calcifications.  Of alcohol abuse in the past.  Has been advanced to regular diet at this time.  Continue IV fluids.  MRCP has been ordered.  LFTs normal.  Patient was seen by GI during hospitalization and was continued  on IV PPI bowel rest.  Patient was subsequently advanced on regular diet which he has tolerated.  At this time patient wishes to go home.  Lipase has trended down.  Has clinically felt better.  I have advised him to stay away from fatty fried greasy food.  Constipation.  Has been started on MiraLAX .  Will continue on discharge   Hyponatremia: Sodium level of 135 from initial 128.  Has improved at this time.  Monitor as outpatient.   Tobacco abuse: Received nicotine  patch.  Does not wish for nicotine  patch on discharge.   History of hyperlipidemia: On pravastatin  at home.  Will be resumed on discharge.    Disposition.  At this time, patient is stable for disposition. Spoke with wife at bedside  Medical Consultants:   GI  Procedures:    MRCP Subjective:   Today, patient was seen and examined at bedside.  Patient's spouse at bedside.  Denies any nausea vomiting abdominal pain fever or chills.  Has had bowel movements.  Was able to tolerate regular food without any problems and wishes to go home.  Discussed about low-fat diet and alcohol  Discharge Exam:   Vitals:   10/24/23 0145 10/24/23 0525  BP: 119/76 (!) 152/88  Pulse: 67 93  Resp: 18 18  Temp: 98.7 F (37.1 C) 98.5 F (36.9 C)  SpO2: 90% 97%   Vitals:   10/23/23 1754 10/23/23 2025 10/24/23 0145 10/24/23 0525  BP: (!) 120/58 134/65 119/76 (!) 152/88  Pulse: 65 67 67 93  Resp: 16 16 18 18   Temp: 98.3 F (36.8 C) 98.3 F (36.8 C) 98.7 F (37.1 C) 98.5 F (  36.9 C)  TempSrc: Oral Oral Oral Oral  SpO2: 98% 99% 90% 97%  Weight:      Height:       Body mass index is 25.69 kg/m.   General: Alert awake, not in obvious distress HENT: pupils equally reacting to light,  No scleral pallor or icterus noted. Oral mucosa is moist.  Chest:  Clear breath sounds.  Diminished breath sounds bilaterally. No crackles or wheezes.  CVS: S1 &S2 heard. No murmur.  Regular rate and rhythm. Abdomen: Soft, nontender, nondistended.  Bowel  sounds are heard.   Extremities: No cyanosis, clubbing or edema.  Peripheral pulses are palpable. Psych: Alert, awake and oriented, normal mood CNS:  No cranial nerve deficits.  Power equal in all extremities.   Skin: Warm and dry.  No rashes noted.  The results of significant diagnostics from this hospitalization (including imaging, microbiology, ancillary and laboratory) are listed below for reference.     Diagnostic Studies:   MR ABDOMEN MRCP W WO CONTAST Result Date: 10/23/2023 CLINICAL DATA:  Severe pancreatitis. EXAM: MRI ABDOMEN WITHOUT AND WITH CONTRAST (INCLUDING MRCP) TECHNIQUE: Multiplanar multisequence MR imaging of the abdomen was performed both before and after the administration of intravenous contrast. Heavily T2-weighted images of the biliary and pancreatic ducts were obtained, and three-dimensional MRCP images were rendered by post processing. CONTRAST:  6mL GADAVIST  GADOBUTROL  1 MMOL/ML IV SOLN COMPARISON:  CT 10/23/2023 FINDINGS: Lower chest: Mild atelectasis at the RIGHT lung base. No pleural fluid. Hepatobiliary: No intrahepatic biliary duct dilatation. The common hepatic duct and common bile duct are normal caliber with the common hepatic duct measuring 6 mm in the common bile duct measuring 5 mm (series 3) no filling defects are evident within the common bile duct. The common bile duct tapers through the pancreatic head difficult to follow to the ampulla. The gallbladder is normal. No gallstones noted. No intrahepatic duct dilatation. Pancreas: Mild duct dilatation in the body and tail the pancreas. There is type branch ductal ectasia. Findings are similar to comparison CT. Calcifications in the head of the pancreas are not well depicted on MRI. Extensive inflammation along the second portion of the duodenum adjacent the pancreatic head. No organized fluid collections. Spleen: Normal spleen. Adrenals/urinary tract: Adrenal glands and kidneys are normal. Stomach/Bowel: Stomach is  normal. There is inflammation along the second portion the duodenum. There is a cystic structure along the medial wall of the first and second portion duodenum measuring 12 mm image 13/3. This is also seen on comparison CT. This may represent do diverticulum of the duodenum or a mural abscess. Additional second and third portion duodenum normal. Vascular/Lymphatic: Abdominal aortic normal caliber. No retroperitoneal periportal lymphadenopathy. Musculoskeletal: No aggressive osseous lesion IMPRESSION: 1. No cholelithiasis or choledocholithiasis. 2. No biliary duct dilatation. 3. Inflammation along the second portion of the duodenum and pancreatic head consistent with acute pancreatitis versus duodenitis. 4. Cystic structure along the medial wall of the first and second portion of the duodenum may represent a duodenal diverticulum, duodenum mural abscess, or pancreatic cyst 5. Mild duct dilatation in the body and tail the pancreas with type branch ductal ectasia. Findings are similar to comparison CT. Calcifications in the head of the pancreas are not well depicted on MRI. Findings consistent chronic pancreatitis. Electronically Signed   By: Jackquline Boxer M.D.   On: 10/23/2023 15:13   MR 3D Recon At Scanner Result Date: 10/23/2023 CLINICAL DATA:  Severe pancreatitis. EXAM: MRI ABDOMEN WITHOUT AND WITH CONTRAST (INCLUDING MRCP) TECHNIQUE: Multiplanar  multisequence MR imaging of the abdomen was performed both before and after the administration of intravenous contrast. Heavily T2-weighted images of the biliary and pancreatic ducts were obtained, and three-dimensional MRCP images were rendered by post processing. CONTRAST:  6mL GADAVIST  GADOBUTROL  1 MMOL/ML IV SOLN COMPARISON:  CT 10/23/2023 FINDINGS: Lower chest: Mild atelectasis at the RIGHT lung base. No pleural fluid. Hepatobiliary: No intrahepatic biliary duct dilatation. The common hepatic duct and common bile duct are normal caliber with the common hepatic  duct measuring 6 mm in the common bile duct measuring 5 mm (series 3) no filling defects are evident within the common bile duct. The common bile duct tapers through the pancreatic head difficult to follow to the ampulla. The gallbladder is normal. No gallstones noted. No intrahepatic duct dilatation. Pancreas: Mild duct dilatation in the body and tail the pancreas. There is type branch ductal ectasia. Findings are similar to comparison CT. Calcifications in the head of the pancreas are not well depicted on MRI. Extensive inflammation along the second portion of the duodenum adjacent the pancreatic head. No organized fluid collections. Spleen: Normal spleen. Adrenals/urinary tract: Adrenal glands and kidneys are normal. Stomach/Bowel: Stomach is normal. There is inflammation along the second portion the duodenum. There is a cystic structure along the medial wall of the first and second portion duodenum measuring 12 mm image 13/3. This is also seen on comparison CT. This may represent do diverticulum of the duodenum or a mural abscess. Additional second and third portion duodenum normal. Vascular/Lymphatic: Abdominal aortic normal caliber. No retroperitoneal periportal lymphadenopathy. Musculoskeletal: No aggressive osseous lesion IMPRESSION: 1. No cholelithiasis or choledocholithiasis. 2. No biliary duct dilatation. 3. Inflammation along the second portion of the duodenum and pancreatic head consistent with acute pancreatitis versus duodenitis. 4. Cystic structure along the medial wall of the first and second portion of the duodenum may represent a duodenal diverticulum, duodenum mural abscess, or pancreatic cyst 5. Mild duct dilatation in the body and tail the pancreas with type branch ductal ectasia. Findings are similar to comparison CT. Calcifications in the head of the pancreas are not well depicted on MRI. Findings consistent chronic pancreatitis. Electronically Signed   By: Jackquline Boxer M.D.   On:  10/23/2023 15:13   CT ABDOMEN PELVIS W CONTRAST Result Date: 10/23/2023 CLINICAL DATA:  Left lower quadrant abdominal pain for 6 weeks. EXAM: CT ABDOMEN AND PELVIS WITH CONTRAST TECHNIQUE: Multidetector CT imaging of the abdomen and pelvis was performed using the standard protocol following bolus administration of intravenous contrast. RADIATION DOSE REDUCTION: This exam was performed according to the departmental dose-optimization program which includes automated exposure control, adjustment of the mA and/or kV according to patient size and/or use of iterative reconstruction technique. CONTRAST:  80mL OMNIPAQUE  IOHEXOL  300 MG/ML  SOLN COMPARISON:  None Available. FINDINGS: Lower chest: Mild right posterior basilar scarring is noted. Hepatobiliary: No focal liver abnormality is seen. No gallstones, gallbladder wall thickening, or biliary dilatation. Pancreas: Multiple calcifications are noted in pancreatic head with mild ductal dilatation. This may simply represent chronic pancreatitis, but ductal stones cannot be excluded. MRCP is recommended for further evaluation. Spleen: Normal in size without focal abnormality. Adrenals/Urinary Tract: Adrenal glands are unremarkable. Kidneys are normal, without renal calculi, focal lesion, or hydronephrosis. Bladder is unremarkable. Stomach/Bowel: There appears to be mild wall thickening and possible inflammation involving gastric antrum and proximal duodenum suggesting possible peptic ulcer disease. Endoscopy may be performed for further evaluation. 2.3 cm cystic density is noted in this area which may  simply represent duodenal diverticulum. There is no evidence of bowel obstruction. The appendix appears normal. Vascular/Lymphatic: Aortic atherosclerosis. No enlarged abdominal or pelvic lymph nodes. Reproductive: Prostate is unremarkable. Other: Small fat containing left inguinal hernia is noted. No ascites is noted. Musculoskeletal: No acute or significant osseous  findings. IMPRESSION: 1. Multiple calcifications are noted in pancreatic head with mild ductal dilatation. This may simply represent chronic pancreatitis, but ductal stones cannot be excluded. MRCP is recommended for further evaluation. 2. There appears to be mild wall thickening and possible inflammation involving gastric antrum and proximal duodenum suggesting possible peptic ulcer disease. Endoscopy may be performed for further evaluation. 2.3 cm cystic density is noted in this area which may simply represent duodenal diverticulum. 3. Small fat containing left inguinal hernia. 4. Aortic atherosclerosis. Aortic Atherosclerosis (ICD10-I70.0). Electronically Signed   By: Lynwood Landy Raddle M.D.   On: 10/23/2023 11:22     Labs:   Basic Metabolic Panel: Recent Labs  Lab 10/23/23 0943 10/24/23 0517  NA 128* 135  K 4.4 4.7  CL 93* 102  CO2 21* 22  GLUCOSE 181* 76  BUN 6* <5*  CREATININE 0.66 0.56*  CALCIUM  9.5 8.8*   GFR Estimated Creatinine Clearance: 70.1 mL/min (A) (by C-G formula based on SCr of 0.56 mg/dL (L)). Liver Function Tests: Recent Labs  Lab 10/23/23 0943 10/24/23 0517  AST 23 13*  ALT 13 9  ALKPHOS 123 103  BILITOT 0.7 0.7  PROT 7.0 6.1*  ALBUMIN 4.2 3.7   Recent Labs  Lab 10/23/23 0943 10/24/23 0517  LIPASE 1,148* 724*   No results for input(s): AMMONIA in the last 168 hours. Coagulation profile No results for input(s): INR, PROTIME in the last 168 hours.  CBC: Recent Labs  Lab 10/23/23 1058 10/24/23 0517  WBC 10.3 9.4  HGB 14.8 15.5  HCT 40.3 45.2  MCV 89.2 93.4  PLT 290 324   Cardiac Enzymes: No results for input(s): CKTOTAL, CKMB, CKMBINDEX, TROPONINI in the last 168 hours. BNP: Invalid input(s): POCBNP CBG: No results for input(s): GLUCAP in the last 168 hours. D-Dimer No results for input(s): DDIMER in the last 72 hours. Hgb A1c No results for input(s): HGBA1C in the last 72 hours. Lipid Profile Recent Labs     10/24/23 0517  TRIG 90   Thyroid  function studies No results for input(s): TSH, T4TOTAL, T3FREE, THYROIDAB in the last 72 hours.  Invalid input(s): FREET3 Anemia work up No results for input(s): VITAMINB12, FOLATE, FERRITIN, TIBC, IRON, RETICCTPCT in the last 72 hours. Microbiology No results found for this or any previous visit (from the past 240 hours).   Discharge Instructions:   Discharge Instructions     Call MD for:  persistant nausea and vomiting   Complete by: As directed    Call MD for:  severe uncontrolled pain   Complete by: As directed    Diet general   Complete by: As directed    Avoid fatty fried cheesy greasy foods if possible.   Discharge instructions   Complete by: As directed    Follow-up with your primary care provider in 1 week.  Follow-up with GI Dr. Ambruster as has been scheduled.  Avoid alcohol.  Seek medical attention for worsening symptoms.   Increase activity slowly   Complete by: As directed       Allergies as of 10/24/2023       Reactions   Penicillins Anaphylaxis   Has patient had a PCN reaction causing immediate rash, facial/tongue/throat swelling,  SOB or lightheadedness with hypotension: Yes Has patient had a PCN reaction causing severe rash involving mucus membranes or skin necrosis: No Has patient had a PCN reaction that required hospitalization: Yes Has patient had a PCN reaction occurring within the last 10 years: No If all of the above answers are NO, then may proceed with Cephalosporin use.   Ativan  [lorazepam ] Other (See Comments)   Made him crazy        Medication List     TAKE these medications    Clear Eyes Triple Action 0.05-0.5-0.6 % Soln Generic drug: Tetrahydroz-Polyvinyl Al-Povid Place 1 drop into both eyes 4 (four) times daily as needed (for dryness).   gabapentin  400 MG capsule Commonly known as: NEURONTIN  TAKE 1 CAPSULE (400 MG TOTAL) BY MOUTH 4 (FOUR) TIMES DAILY.   Ibuprofen  200  MG Caps Take 600 mg by mouth every 8 (eight) hours as needed (for pain or headaches).   loratadine  10 MG tablet Commonly known as: CLARITIN  Take 10 mg by mouth daily as needed for allergies or rhinitis.   pantoprazole  40 MG tablet Commonly known as: Protonix  Take 1 tablet (40 mg total) by mouth daily.   polyethylene glycol 17 g packet Commonly known as: MIRALAX  / GLYCOLAX  Take 17 g by mouth daily. Start taking on: October 25, 2023   pravastatin  10 MG tablet Commonly known as: PRAVACHOL  TAKE 1 TABLET BY MOUTH EVERY DAY What changed:  when to take this additional instructions   traZODone  50 MG tablet Commonly known as: DESYREL  Take 0.5-1 tablets (25-50 mg total) by mouth at bedtime as needed for sleep.        Follow-up Information     Job Lukes, [place] Follow up in 1 week(s).   Specialty: Physician Assistant Contact information: [address] [place] [address] [phone]         Leigh Elspeth SQUIBB, MD Follow up on 11/16/2023.   Specialty: Gastroenterology Why: 11/16/23 at 2:30 pm. Contact information: [address] [place] [address] [phone]                  Time coordinating discharge: 39 minutes  Signed:  Gwynneth Fabio  [facility] 10/24/2023, 2:31 PM

## 2023-10-24 NOTE — Progress Notes (Signed)
   10/24/23 1144  TOC Brief Assessment  Insurance and Status Reviewed  Patient has primary care physician Yes  Home environment has been reviewed Resides in single family home with spouse  Prior level of function: Independent with ADLs at baseline  Prior/Current Home Services No current home services  Social Drivers of Health Review SDOH reviewed no interventions necessary  Readmission risk has been reviewed Yes  Transition of care needs no transition of care needs at this time

## 2023-10-24 NOTE — Progress Notes (Signed)
 Subjective: No pain this AM, but it tends to wax and wane.  Objective: Vital signs in last 24 hours: Temp:  [97.9 F (36.6 C)-98.7 F (37.1 C)] 98.5 F (36.9 C) (08/30 0525) Pulse Rate:  [65-93] 93 (08/30 0525) Resp:  [16-18] 18 (08/30 0525) BP: (119-152)/(58-88) 152/88 (08/30 0525) SpO2:  [90 %-99 %] 97 % (08/30 0525) Last BM Date : 10/23/23  Intake/Output from previous day: 08/29 0701 - 08/30 0700 In: 902.5 [P.O.:720; I.V.:182.5] Out: 350 [Urine:350] Intake/Output this shift: Total I/O In: 360 [P.O.:360] Out: -   General appearance: alert and no distress GI: soft, non-tender; bowel sounds normal; no masses,  no organomegaly  Lab Results: Recent Labs    10/23/23 1058 10/24/23 0517  WBC 10.3 9.4  HGB 14.8 15.5  HCT 40.3 45.2  PLT 290 324   BMET Recent Labs    10/23/23 0943 10/24/23 0517  NA 128* 135  K 4.4 4.7  CL 93* 102  CO2 21* 22  GLUCOSE 181* 76  BUN 6* <5*  CREATININE 0.66 0.56*  CALCIUM  9.5 8.8*   LFT Recent Labs    10/24/23 0517  PROT 6.1*  ALBUMIN 3.7  AST 13*  ALT 9  ALKPHOS 103  BILITOT 0.7   PT/INR No results for input(s): LABPROT, INR in the last 72 hours. Hepatitis Panel No results for input(s): HEPBSAG, HCVAB, HEPAIGM, HEPBIGM in the last 72 hours. C-Diff No results for input(s): CDIFFTOX in the last 72 hours. Fecal Lactopherrin No results for input(s): FECLLACTOFRN in the last 72 hours.  Studies/Results: MR ABDOMEN MRCP W WO CONTAST Result Date: 10/23/2023 CLINICAL DATA:  Severe pancreatitis. EXAM: MRI ABDOMEN WITHOUT AND WITH CONTRAST (INCLUDING MRCP) TECHNIQUE: Multiplanar multisequence MR imaging of the abdomen was performed both before and after the administration of intravenous contrast. Heavily T2-weighted images of the biliary and pancreatic ducts were obtained, and three-dimensional MRCP images were rendered by post processing. CONTRAST:  6mL GADAVIST  GADOBUTROL  1 MMOL/ML IV SOLN COMPARISON:  CT  10/23/2023 FINDINGS: Lower chest: Mild atelectasis at the RIGHT lung base. No pleural fluid. Hepatobiliary: No intrahepatic biliary duct dilatation. The common hepatic duct and common bile duct are normal caliber with the common hepatic duct measuring 6 mm in the common bile duct measuring 5 mm (series 3) no filling defects are evident within the common bile duct. The common bile duct tapers through the pancreatic head difficult to follow to the ampulla. The gallbladder is normal. No gallstones noted. No intrahepatic duct dilatation. Pancreas: Mild duct dilatation in the body and tail the pancreas. There is type branch ductal ectasia. Findings are similar to comparison CT. Calcifications in the head of the pancreas are not well depicted on MRI. Extensive inflammation along the second portion of the duodenum adjacent the pancreatic head. No organized fluid collections. Spleen: Normal spleen. Adrenals/urinary tract: Adrenal glands and kidneys are normal. Stomach/Bowel: Stomach is normal. There is inflammation along the second portion the duodenum. There is a cystic structure along the medial wall of the first and second portion duodenum measuring 12 mm image 13/3. This is also seen on comparison CT. This may represent do diverticulum of the duodenum or a mural abscess. Additional second and third portion duodenum normal. Vascular/Lymphatic: Abdominal aortic normal caliber. No retroperitoneal periportal lymphadenopathy. Musculoskeletal: No aggressive osseous lesion IMPRESSION: 1. No cholelithiasis or choledocholithiasis. 2. No biliary duct dilatation. 3. Inflammation along the second portion of the duodenum and pancreatic head consistent with acute pancreatitis versus duodenitis. 4. Cystic structure along the medial  wall of the first and second portion of the duodenum may represent a duodenal diverticulum, duodenum mural abscess, or pancreatic cyst 5. Mild duct dilatation in the body and tail the pancreas with type  branch ductal ectasia. Findings are similar to comparison CT. Calcifications in the head of the pancreas are not well depicted on MRI. Findings consistent chronic pancreatitis. Electronically Signed   By: Jackquline Boxer M.D.   On: 10/23/2023 15:13   MR 3D Recon At Scanner Result Date: 10/23/2023 CLINICAL DATA:  Severe pancreatitis. EXAM: MRI ABDOMEN WITHOUT AND WITH CONTRAST (INCLUDING MRCP) TECHNIQUE: Multiplanar multisequence MR imaging of the abdomen was performed both before and after the administration of intravenous contrast. Heavily T2-weighted images of the biliary and pancreatic ducts were obtained, and three-dimensional MRCP images were rendered by post processing. CONTRAST:  6mL GADAVIST  GADOBUTROL  1 MMOL/ML IV SOLN COMPARISON:  CT 10/23/2023 FINDINGS: Lower chest: Mild atelectasis at the RIGHT lung base. No pleural fluid. Hepatobiliary: No intrahepatic biliary duct dilatation. The common hepatic duct and common bile duct are normal caliber with the common hepatic duct measuring 6 mm in the common bile duct measuring 5 mm (series 3) no filling defects are evident within the common bile duct. The common bile duct tapers through the pancreatic head difficult to follow to the ampulla. The gallbladder is normal. No gallstones noted. No intrahepatic duct dilatation. Pancreas: Mild duct dilatation in the body and tail the pancreas. There is type branch ductal ectasia. Findings are similar to comparison CT. Calcifications in the head of the pancreas are not well depicted on MRI. Extensive inflammation along the second portion of the duodenum adjacent the pancreatic head. No organized fluid collections. Spleen: Normal spleen. Adrenals/urinary tract: Adrenal glands and kidneys are normal. Stomach/Bowel: Stomach is normal. There is inflammation along the second portion the duodenum. There is a cystic structure along the medial wall of the first and second portion duodenum measuring 12 mm image 13/3. This is  also seen on comparison CT. This may represent do diverticulum of the duodenum or a mural abscess. Additional second and third portion duodenum normal. Vascular/Lymphatic: Abdominal aortic normal caliber. No retroperitoneal periportal lymphadenopathy. Musculoskeletal: No aggressive osseous lesion IMPRESSION: 1. No cholelithiasis or choledocholithiasis. 2. No biliary duct dilatation. 3. Inflammation along the second portion of the duodenum and pancreatic head consistent with acute pancreatitis versus duodenitis. 4. Cystic structure along the medial wall of the first and second portion of the duodenum may represent a duodenal diverticulum, duodenum mural abscess, or pancreatic cyst 5. Mild duct dilatation in the body and tail the pancreas with type branch ductal ectasia. Findings are similar to comparison CT. Calcifications in the head of the pancreas are not well depicted on MRI. Findings consistent chronic pancreatitis. Electronically Signed   By: Jackquline Boxer M.D.   On: 10/23/2023 15:13   CT ABDOMEN PELVIS W CONTRAST Result Date: 10/23/2023 CLINICAL DATA:  Left lower quadrant abdominal pain for 6 weeks. EXAM: CT ABDOMEN AND PELVIS WITH CONTRAST TECHNIQUE: Multidetector CT imaging of the abdomen and pelvis was performed using the standard protocol following bolus administration of intravenous contrast. RADIATION DOSE REDUCTION: This exam was performed according to the departmental dose-optimization program which includes automated exposure control, adjustment of the mA and/or kV according to patient size and/or use of iterative reconstruction technique. CONTRAST:  80mL OMNIPAQUE  IOHEXOL  300 MG/ML  SOLN COMPARISON:  None Available. FINDINGS: Lower chest: Mild right posterior basilar scarring is noted. Hepatobiliary: No focal liver abnormality is seen. No gallstones, gallbladder wall  thickening, or biliary dilatation. Pancreas: Multiple calcifications are noted in pancreatic head with mild ductal dilatation.  This may simply represent chronic pancreatitis, but ductal stones cannot be excluded. MRCP is recommended for further evaluation. Spleen: Normal in size without focal abnormality. Adrenals/Urinary Tract: Adrenal glands are unremarkable. Kidneys are normal, without renal calculi, focal lesion, or hydronephrosis. Bladder is unremarkable. Stomach/Bowel: There appears to be mild wall thickening and possible inflammation involving gastric antrum and proximal duodenum suggesting possible peptic ulcer disease. Endoscopy may be performed for further evaluation. 2.3 cm cystic density is noted in this area which may simply represent duodenal diverticulum. There is no evidence of bowel obstruction. The appendix appears normal. Vascular/Lymphatic: Aortic atherosclerosis. No enlarged abdominal or pelvic lymph nodes. Reproductive: Prostate is unremarkable. Other: Small fat containing left inguinal hernia is noted. No ascites is noted. Musculoskeletal: No acute or significant osseous findings. IMPRESSION: 1. Multiple calcifications are noted in pancreatic head with mild ductal dilatation. This may simply represent chronic pancreatitis, but ductal stones cannot be excluded. MRCP is recommended for further evaluation. 2. There appears to be mild wall thickening and possible inflammation involving gastric antrum and proximal duodenum suggesting possible peptic ulcer disease. Endoscopy may be performed for further evaluation. 2.3 cm cystic density is noted in this area which may simply represent duodenal diverticulum. 3. Small fat containing left inguinal hernia. 4. Aortic atherosclerosis. Aortic Atherosclerosis (ICD10-I70.0). Electronically Signed   By: Lynwood Landy Raddle M.D.   On: 10/23/2023 11:22    Medications: Scheduled:  enoxaparin  (LOVENOX ) injection  40 mg Subcutaneous Q24H   gabapentin   400 mg Oral QID   nicotine   14 mg Transdermal Daily   pantoprazole  (PROTONIX ) IV  40 mg Intravenous Q12H   polyethylene glycol  17 g  Oral Daily   pravastatin   10 mg Oral Daily   Continuous:  Assessment/Plan: 1) Chronic Calcific Pancreatitis. 2) Abdominal pain.   The patient is well this AM, but PO intake will tend to worsen his symptoms.  He knows to avoid certain trigger foods such as cereal.  At this time he desires to try solid food.  The MRCP did not show any pancreatic ductal stones.  The CT scan was positive for intrapancreatic stones.  Plan: 1) Advance to a regular diet. 2) Pain control and supportive care. 3) D/C pantoprazole .  LOS: 0 days   Lasean Gorniak D 10/24/2023, 11:07 AM

## 2023-10-24 NOTE — Final Progress Note (Signed)
 AVS reviewed with patient whom verbalized understanding and denies further needs. IV removed. Belongings gathered and returned with patient.

## 2023-10-25 ENCOUNTER — Other Ambulatory Visit: Payer: Self-pay

## 2023-10-27 ENCOUNTER — Other Ambulatory Visit: Payer: Self-pay

## 2023-10-27 ENCOUNTER — Encounter: Payer: Self-pay | Admitting: Pharmacist

## 2023-10-27 ENCOUNTER — Other Ambulatory Visit (HOSPITAL_COMMUNITY): Payer: Self-pay

## 2023-10-27 ENCOUNTER — Encounter: Payer: Self-pay | Admitting: Gastroenterology

## 2023-10-27 NOTE — Telephone Encounter (Signed)
 Patient notified and paperwork mailed

## 2023-10-27 NOTE — Telephone Encounter (Signed)
 PT wife is calling to find out if there is another availability for the 9/22 appt. She wants to be there so it needs to be rescheduled. Please advise.

## 2023-10-27 NOTE — Telephone Encounter (Signed)
 Spoke to [place], patient's wife. Patient has been rescheduled to 11/19/23 at 230 pm. She verbalizes understanding.

## 2023-10-29 ENCOUNTER — Other Ambulatory Visit (HOSPITAL_COMMUNITY): Payer: Self-pay

## 2023-11-09 ENCOUNTER — Other Ambulatory Visit: Payer: Self-pay | Admitting: Physician Assistant

## 2023-11-09 ENCOUNTER — Encounter: Payer: Self-pay | Admitting: Physician Assistant

## 2023-11-09 ENCOUNTER — Other Ambulatory Visit (HOSPITAL_BASED_OUTPATIENT_CLINIC_OR_DEPARTMENT_OTHER): Payer: Self-pay

## 2023-11-09 ENCOUNTER — Other Ambulatory Visit (HOSPITAL_COMMUNITY): Payer: Self-pay

## 2023-11-09 ENCOUNTER — Encounter (HOSPITAL_COMMUNITY): Payer: Self-pay

## 2023-11-09 MED ORDER — GABAPENTIN 400 MG PO CAPS
400.0000 mg | ORAL_CAPSULE | Freq: Four times a day (QID) | ORAL | 1 refills | Status: AC
  Filled 2023-11-09: qty 360, 90d supply, fill #0
  Filled 2024-02-03: qty 360, 90d supply, fill #1

## 2023-11-09 NOTE — Telephone Encounter (Signed)
 Copied from CRM #[id number]. Topic: Clinical - Medication Refill >> Nov 09, 2023  9:36 AM Rosina BIRCH wrote: Medication: gabapentin  (NEURONTIN ) 400 MG capsule Would like it mailed to him and the doctor told him to call when he is on his last bottle  Has the patient contacted their pharmacy? No (Agent: If no, request that the patient contact the pharmacy for the refill. If patient does not wish to contact the pharmacy document the reason why and proceed with request.) (Agent: If yes, when and what did the pharmacy advise?)  This is the patient's preferred pharmacy:  [facility] [address] [place] [address] Phone: [phone] Fax: [phone]  Is this the correct pharmacy for this prescription? Yes If no, delete pharmacy and type the correct one.   Has the prescription been filled recently? No  Is the patient out of the medication? No  Has the patient been seen for an appointment in the last year OR does the patient have an upcoming appointment? Yes  Can we respond through MyChart? Yes  Agent: Please be advised that Rx refills may take up to 3 business days. We ask that you follow-up with your pharmacy.

## 2023-11-10 ENCOUNTER — Other Ambulatory Visit: Payer: Self-pay | Admitting: Physician Assistant

## 2023-11-16 ENCOUNTER — Ambulatory Visit: Admitting: Gastroenterology

## 2023-11-19 ENCOUNTER — Other Ambulatory Visit (HOSPITAL_COMMUNITY): Payer: Self-pay

## 2023-11-19 ENCOUNTER — Ambulatory Visit (INDEPENDENT_AMBULATORY_CARE_PROVIDER_SITE_OTHER): Payer: Self-pay | Admitting: Gastroenterology

## 2023-11-19 ENCOUNTER — Encounter: Payer: Self-pay | Admitting: Gastroenterology

## 2023-11-19 VITALS — BP 132/78 | HR 97 | Ht 63.0 in | Wt 135.1 lb

## 2023-11-19 DIAGNOSIS — Z1211 Encounter for screening for malignant neoplasm of colon: Secondary | ICD-10-CM

## 2023-11-19 DIAGNOSIS — R933 Abnormal findings on diagnostic imaging of other parts of digestive tract: Secondary | ICD-10-CM | POA: Diagnosis not present

## 2023-11-19 DIAGNOSIS — F17210 Nicotine dependence, cigarettes, uncomplicated: Secondary | ICD-10-CM

## 2023-11-19 DIAGNOSIS — F1010 Alcohol abuse, uncomplicated: Secondary | ICD-10-CM

## 2023-11-19 DIAGNOSIS — K859 Acute pancreatitis without necrosis or infection, unspecified: Secondary | ICD-10-CM

## 2023-11-19 MED ORDER — PANTOPRAZOLE SODIUM 40 MG PO TBEC
40.0000 mg | DELAYED_RELEASE_TABLET | Freq: Every day | ORAL | 3 refills | Status: AC
  Filled 2023-11-19: qty 90, 90d supply, fill #0

## 2023-11-19 NOTE — Progress Notes (Signed)
 HPI :  68 year old male here for a follow-up visit after hospitalization this past August.  He is a new patient to me, previously seen by Dr. Legrand during inpatient stay.  See inpatient notes for full consultation report.  In short he was admitted to the hospital in late August with progressive abdominal pain.  He was found to have a lipase of over thousand, with normal liver enzymes.  He had a CT scan showing calcifications in the pancreas with mild pancreatic ductal dilation, changes consistent with chronic pancreatitis.  He also had mild thickening of the antrum and proximal duodenum, concerning for possible PUD and a cystic structure in the duodenum, likely diverticulum.  He had a follow-up MRCP which showed no evidence of gallstones or choledocholithiasis, he had some inflammatory changes around the pancreas and again in the bowel, he also had some mild pancreatic ductal dilation as well.  During his hospitalization he had normal triglyceride levels.  He was given PPI and was in the hospital about 24 hours and was discharged in stable condition.  The plan was to do an EGD at the hospital however he improved significantly and they decided to pursue this as outpatient.  At the time of admission he states he was using ibuprofen  daily for joint pains/arthritis.  He has since stopped using all NSAIDs and has been on Protonix  40 mg once daily.  Since the hospitalizations he has been feeling much better.  He states his abdominal pain is completely resolved.  He has no nausea or vomiting.  Denies any reflux symptoms or dysphagia.  He reports he moves his bowels regularly.  He was having constipation before but has since been on MiraLAX  occasionally which works for him.  No blood in his stools.  In regards to his risk factors for chronic pancreatitis, he endorses heavy alcohol use in the past.  When asked specifically how much she drank, he states more than he should have.  He states he has been sober for  more than 5 years, has not had any alcohol during this time.  He does smoke about half a pack of cigarettes per day at baseline.  He denies any cardiopulmonary symptoms.  Denies any history of CAD.  He was hospitalized with pneumonia and empyema with a chest tube placement in 2021.  He states he has recovered from that.  He denies any family history of pancreatic cancer, no family history of colon cancer.  He is never had a colonoscopy before.  He is accompanied by his wife today.   Past Medical History:  Diagnosis Date   Alcohol addiction (HCC)    Allergy    Cancer (HCC)    Melanoma on face was removed   Depression    Herpes genitalis in men    History of basal cell carcinoma of skin    History of chicken pox    Pancreatitis 09/2023   Seizures (HCC)      Past Surgical History:  Procedure Laterality Date   FOOT FRACTURE SURGERY Bilateral    NOSE SURGERY     VASECTOMY     Family History  Problem Relation Age of Onset   Cancer Mother        bladder   Diabetes Mother    Arthritis Mother    Alcohol abuse Father    Parkinson's disease Sister    Healthy Brother    Social History   Tobacco Use   Smoking status: Every Day    Current packs/day:  0.50    Average packs/day: 0.5 packs/day for 45.7 years (22.9 ttl pk-yrs)    Types: Cigarettes    Start date: 10   Smokeless tobacco: Never  Vaping Use   Vaping status: Never Used  Substance Use Topics   Alcohol use: Not Currently   Drug use: No   Current Outpatient Medications  Medication Sig Dispense Refill   CLEAR EYES TRIPLE ACTION 0.05-0.5-0.6 % SOLN Place 1 drop into both eyes 4 (four) times daily as needed (for dryness).     gabapentin  (NEURONTIN ) 400 MG capsule Take 1 capsule (400 mg total) by mouth 4 (four) times daily. 360 capsule 1   Ibuprofen  200 MG CAPS Take 600 mg by mouth every 8 (eight) hours as needed (for pain or headaches).     loratadine  (CLARITIN ) 10 MG tablet Take 10 mg by mouth daily as needed for  allergies or rhinitis.     pantoprazole  (PROTONIX ) 40 MG tablet Take 1 tablet (40 mg total) by mouth daily. 30 tablet 1   polyethylene glycol (MIRALAX  / GLYCOLAX ) 17 g packet Take 17 g by mouth daily.     pravastatin  (PRAVACHOL ) 10 MG tablet TAKE 1 TABLET BY MOUTH EVERY DAY (Patient taking differently: Take 10 mg by mouth See admin instructions. Take 10 mg by mouth at bedtime on Sundays and Thursdays only) 90 tablet 0   traZODone  (DESYREL ) 50 MG tablet Take 0.5-1 tablets (25-50 mg total) by mouth at bedtime as needed for sleep. 30 tablet 3   No current facility-administered medications for this visit.   Allergies  Allergen Reactions   Penicillins Anaphylaxis    Has patient had a PCN reaction causing immediate rash, facial/tongue/throat swelling, SOB or lightheadedness with hypotension: Yes Has patient had a PCN reaction causing severe rash involving mucus membranes or skin necrosis: No Has patient had a PCN reaction that required hospitalization: Yes Has patient had a PCN reaction occurring within the last 10 years: No If all of the above answers are NO, then may proceed with Cephalosporin use.    Ativan  [Lorazepam ] Other (See Comments)    Made him crazy     Review of Systems: All systems reviewed and negative except where noted in HPI.    MR ABDOMEN MRCP W WO CONTAST Result Date: 10/23/2023 CLINICAL DATA:  Severe pancreatitis. EXAM: MRI ABDOMEN WITHOUT AND WITH CONTRAST (INCLUDING MRCP) TECHNIQUE: Multiplanar multisequence MR imaging of the abdomen was performed both before and after the administration of intravenous contrast. Heavily T2-weighted images of the biliary and pancreatic ducts were obtained, and three-dimensional MRCP images were rendered by post processing. CONTRAST:  6mL GADAVIST  GADOBUTROL  1 MMOL/ML IV SOLN COMPARISON:  CT 10/23/2023 FINDINGS: Lower chest: Mild atelectasis at the RIGHT lung base. No pleural fluid. Hepatobiliary: No intrahepatic biliary duct dilatation.  The common hepatic duct and common bile duct are normal caliber with the common hepatic duct measuring 6 mm in the common bile duct measuring 5 mm (series 3) no filling defects are evident within the common bile duct. The common bile duct tapers through the pancreatic head difficult to follow to the ampulla. The gallbladder is normal. No gallstones noted. No intrahepatic duct dilatation. Pancreas: Mild duct dilatation in the body and tail the pancreas. There is type branch ductal ectasia. Findings are similar to comparison CT. Calcifications in the head of the pancreas are not well depicted on MRI. Extensive inflammation along the second portion of the duodenum adjacent the pancreatic head. No organized fluid collections. Spleen: Normal spleen. Adrenals/urinary  tract: Adrenal glands and kidneys are normal. Stomach/Bowel: Stomach is normal. There is inflammation along the second portion the duodenum. There is a cystic structure along the medial wall of the first and second portion duodenum measuring 12 mm image 13/3. This is also seen on comparison CT. This may represent do diverticulum of the duodenum or a mural abscess. Additional second and third portion duodenum normal. Vascular/Lymphatic: Abdominal aortic normal caliber. No retroperitoneal periportal lymphadenopathy. Musculoskeletal: No aggressive osseous lesion IMPRESSION: 1. No cholelithiasis or choledocholithiasis. 2. No biliary duct dilatation. 3. Inflammation along the second portion of the duodenum and pancreatic head consistent with acute pancreatitis versus duodenitis. 4. Cystic structure along the medial wall of the first and second portion of the duodenum may represent a duodenal diverticulum, duodenum mural abscess, or pancreatic cyst 5. Mild duct dilatation in the body and tail the pancreas with type branch ductal ectasia. Findings are similar to comparison CT. Calcifications in the head of the pancreas are not well depicted on MRI. Findings  consistent chronic pancreatitis. Electronically Signed   By: Jackquline Boxer M.D.   On: 10/23/2023 15:13   MR 3D Recon At Scanner Result Date: 10/23/2023 CLINICAL DATA:  Severe pancreatitis. EXAM: MRI ABDOMEN WITHOUT AND WITH CONTRAST (INCLUDING MRCP) TECHNIQUE: Multiplanar multisequence MR imaging of the abdomen was performed both before and after the administration of intravenous contrast. Heavily T2-weighted images of the biliary and pancreatic ducts were obtained, and three-dimensional MRCP images were rendered by post processing. CONTRAST:  6mL GADAVIST  GADOBUTROL  1 MMOL/ML IV SOLN COMPARISON:  CT 10/23/2023 FINDINGS: Lower chest: Mild atelectasis at the RIGHT lung base. No pleural fluid. Hepatobiliary: No intrahepatic biliary duct dilatation. The common hepatic duct and common bile duct are normal caliber with the common hepatic duct measuring 6 mm in the common bile duct measuring 5 mm (series 3) no filling defects are evident within the common bile duct. The common bile duct tapers through the pancreatic head difficult to follow to the ampulla. The gallbladder is normal. No gallstones noted. No intrahepatic duct dilatation. Pancreas: Mild duct dilatation in the body and tail the pancreas. There is type branch ductal ectasia. Findings are similar to comparison CT. Calcifications in the head of the pancreas are not well depicted on MRI. Extensive inflammation along the second portion of the duodenum adjacent the pancreatic head. No organized fluid collections. Spleen: Normal spleen. Adrenals/urinary tract: Adrenal glands and kidneys are normal. Stomach/Bowel: Stomach is normal. There is inflammation along the second portion the duodenum. There is a cystic structure along the medial wall of the first and second portion duodenum measuring 12 mm image 13/3. This is also seen on comparison CT. This may represent do diverticulum of the duodenum or a mural abscess. Additional second and third portion duodenum  normal. Vascular/Lymphatic: Abdominal aortic normal caliber. No retroperitoneal periportal lymphadenopathy. Musculoskeletal: No aggressive osseous lesion IMPRESSION: 1. No cholelithiasis or choledocholithiasis. 2. No biliary duct dilatation. 3. Inflammation along the second portion of the duodenum and pancreatic head consistent with acute pancreatitis versus duodenitis. 4. Cystic structure along the medial wall of the first and second portion of the duodenum may represent a duodenal diverticulum, duodenum mural abscess, or pancreatic cyst 5. Mild duct dilatation in the body and tail the pancreas with type branch ductal ectasia. Findings are similar to comparison CT. Calcifications in the head of the pancreas are not well depicted on MRI. Findings consistent chronic pancreatitis. Electronically Signed   By: Jackquline Boxer M.D.   On: 10/23/2023 15:13  CT ABDOMEN PELVIS W CONTRAST Result Date: 10/23/2023 CLINICAL DATA:  Left lower quadrant abdominal pain for 6 weeks. EXAM: CT ABDOMEN AND PELVIS WITH CONTRAST TECHNIQUE: Multidetector CT imaging of the abdomen and pelvis was performed using the standard protocol following bolus administration of intravenous contrast. RADIATION DOSE REDUCTION: This exam was performed according to the departmental dose-optimization program which includes automated exposure control, adjustment of the mA and/or kV according to patient size and/or use of iterative reconstruction technique. CONTRAST:  80mL OMNIPAQUE  IOHEXOL  300 MG/ML  SOLN COMPARISON:  None Available. FINDINGS: Lower chest: Mild right posterior basilar scarring is noted. Hepatobiliary: No focal liver abnormality is seen. No gallstones, gallbladder wall thickening, or biliary dilatation. Pancreas: Multiple calcifications are noted in pancreatic head with mild ductal dilatation. This may simply represent chronic pancreatitis, but ductal stones cannot be excluded. MRCP is recommended for further evaluation. Spleen: Normal  in size without focal abnormality. Adrenals/Urinary Tract: Adrenal glands are unremarkable. Kidneys are normal, without renal calculi, focal lesion, or hydronephrosis. Bladder is unremarkable. Stomach/Bowel: There appears to be mild wall thickening and possible inflammation involving gastric antrum and proximal duodenum suggesting possible peptic ulcer disease. Endoscopy may be performed for further evaluation. 2.3 cm cystic density is noted in this area which may simply represent duodenal diverticulum. There is no evidence of bowel obstruction. The appendix appears normal. Vascular/Lymphatic: Aortic atherosclerosis. No enlarged abdominal or pelvic lymph nodes. Reproductive: Prostate is unremarkable. Other: Small fat containing left inguinal hernia is noted. No ascites is noted. Musculoskeletal: No acute or significant osseous findings. IMPRESSION: 1. Multiple calcifications are noted in pancreatic head with mild ductal dilatation. This may simply represent chronic pancreatitis, but ductal stones cannot be excluded. MRCP is recommended for further evaluation. 2. There appears to be mild wall thickening and possible inflammation involving gastric antrum and proximal duodenum suggesting possible peptic ulcer disease. Endoscopy may be performed for further evaluation. 2.3 cm cystic density is noted in this area which may simply represent duodenal diverticulum. 3. Small fat containing left inguinal hernia. 4. Aortic atherosclerosis. Aortic Atherosclerosis (ICD10-I70.0). Electronically Signed   By: Lynwood Landy Raddle M.D.   On: 10/23/2023 11:22    Physical Exam: BP 132/78   Pulse 97   Ht 5' 3 (1.6 m)   Wt 135 lb 2 oz (61.3 kg)   BMI 23.94 kg/m  Constitutional: Pleasant,well-developed, male in no acute distress. Neurological: Alert and oriented to person place and time. Psychiatric: Normal mood and affect. Behavior is normal.  Lab Results  Component Value Date   WBC 9.4 10/24/2023   HGB 15.5 10/24/2023    HCT 45.2 10/24/2023   MCV 93.4 10/24/2023   PLT 324 10/24/2023    Lab Results  Component Value Date   NA 135 10/24/2023   CL 102 10/24/2023   K 4.7 10/24/2023   CO2 22 10/24/2023   BUN <5 (L) 10/24/2023   CREATININE 0.56 (L) 10/24/2023   GFRNONAA >60 10/24/2023   CALCIUM  8.8 (L) 10/24/2023   PHOS 4.4 09/22/2019   ALBUMIN 3.7 10/24/2023   GLUCOSE 76 10/24/2023    Lab Results  Component Value Date   ALT 9 10/24/2023   AST 13 (L) 10/24/2023   ALKPHOS 103 10/24/2023   BILITOT 0.7 10/24/2023     ASSESSMENT: 68 y.o. male here for assessment of the following  1. Acute on chronic pancreatitis (HCC)   2. Abnormal magnetic resonance cholangiopancreatography (MRCP)   3. Colon cancer screening    Admitted in late August with suspected acute  on chronic pancreatitis based on labs and imaging.  He was also using NSAIDs daily and had some changes in his bowel and CT scan that raise question of PUD as well.  He was placed on PPI and treated for pancreatitis, he did remarkably well after 24 hours and was discharged to follow-up as outpatient.  He has been compliant with PPI since discharge, he states his symptoms have completely abated.  We discussed his course to date regarding these issues.  He likely has chronic pancreatitis from history of alcohol abuse and counseled him that tobacco use can increase the risk for this as well.  Significant calcifications noted on CT, with some pancreatic ductal dilation on imaging as well.  It is unclear why he had a flare of acute pancreatitis that led to his hospitalization given he denies any alcohol use or other triggers.  He had no gallstones, LFTs were normal.  I offered him an evaluation with a EUS given pancreatic ductal dilation, and sure no small growth there etc.  We discussed what this is at length and he strongly states he did not want to pursue this.  His wife was more interested in having him have this done but the patient did not want to  pursue it.  I also discussed to follow-up MRCP at some point to reassess his pancreas which he also declined during today's visit.  I explained reasoning for recommending this, he understands and still declines.  I asked him to think about it and let me know if they change their mind.  Otherwise recommend he continue to abstain from alcohol and tobacco cessation over time would also be best for his health.  If he has recurrent symptoms that bother him he needs to let us  know.  Again would ideally recommend follow-up imaging in someway of his pancreas in the next 6 months or so which he declines.  Regarding his imaging findings and NSAID use, certainly at risk for PUD, recommend EGD to evaluate for this and ensure no mucosal abnormalities.  Discussed risks and benefits of this.  He wants to think about the EGD, he is not sure if he wants to do it.  I recommend he completely avoid NSAIDs and continue his Protonix  for now until he makes a decision about this.  We did discuss colon cancer screening, he has never had a colonoscopy.  Offered him an optical colonoscopy which he does not wish to do.  He he needs to sort out his wife's work schedule in regards to when he will be able to do this, they would like to call us  in the next week or so for scheduling.  The patient's wife would like him to do the EGD as well and will see if he is willing to do that.  He denies any cardiopulmonary symptoms, no history of CAD known  PLAN: - discussed pancreatitis and imaging findings, chronic pancreatitis likely from EtOH - recommend continued alcohol abstinence - recommend tobacco cessation - recommended EUS - he declined. As an alterative discussed follow up MRCP at some point, he declines  - continue protonix  40mg  / day for now - recommend EGD to screen for PUD, ensure no mass lesions given CT findings, he will think about this and discuss with his wife. He did not want to schedule this today. - recommend colonoscopy  for CRC screening, he is willing to do this but wants to call back to schedule.   I spent 35 minutes of time, including  in depth chart review, face-to-face time with the patient, and documentation.  Marcey Naval, MD [facility]

## 2023-11-19 NOTE — Patient Instructions (Addendum)
 We have sent the following medications to your pharmacy for you to pick up at your convenience: Protonix  40 mg  It has been recommended to you by your physician that you have a(n) EGD/Colonoscopy completed. Per your request, we did not schedule the procedure(s) today. Please contact our office at [phone] should you decide to have the procedure completed. You will be scheduled for a pre-visit and procedure at that time.   Thank you for entrusting me with your care and for choosing [facility], Dr. Elspeth Naval    _______________________________________________________  If your blood pressure at your visit was 140/90 or greater, please contact your primary care physician to follow up on this.  _______________________________________________________  If you are age 69 or older, your body mass index should be between 23-30. Your Body mass index is 23.94 kg/m. If this is out of the aforementioned range listed, please consider follow up with your Primary Care Provider.  If you are age 69 or younger, your body mass index should be between 19-25. Your Body mass index is 23.94 kg/m. If this is out of the aformentioned range listed, please consider follow up with your Primary Care Provider.   ________________________________________________________  The [facility] providers would like to encourage you to use MYCHART to communicate with providers for non-urgent requests or questions.  Due to long hold times on the telephone, sending your provider a message by [facility] may be a faster and more efficient way to get a response.  Please allow 48 business hours for a response.  Please remember that this is for non-urgent requests.  _______________________________________________________  [facility] is using a team-based approach to care.  Your team is made up of your doctor and two to three APPS. Our APPS (Nurse Practitioners and Physician Assistants) work with your physician  to ensure care continuity for you. They are fully qualified to address your health concerns and develop a treatment plan. They communicate directly with your gastroenterologist to care for you. Seeing the Advanced Practice Practitioners on your physician's team can help you by facilitating care more promptly, often allowing for earlier appointments, access to diagnostic testing, procedures, and other specialty referrals.

## 2023-11-23 ENCOUNTER — Telehealth: Payer: Self-pay

## 2023-11-23 NOTE — Telephone Encounter (Signed)
Got it, thanks Jan

## 2023-11-23 NOTE — Telephone Encounter (Signed)
 Called patient's wife, Rosaline.  She indicated that she could not schedule the procedure until January due to work.  As January is not out yet, patient said she would call back in early November

## 2023-11-23 NOTE — Telephone Encounter (Signed)
-----   Message from [facility] sent at 11/19/2023  3:45 PM EDT ----- Regarding: call pt's wife to schedule procedure Call patient's wife to schedule EGD/Colonoscopy (or just colon if patient will not have EGD) with [facility] (abnormal MRCP, Colon cancer screening)

## 2023-12-02 ENCOUNTER — Other Ambulatory Visit (HOSPITAL_COMMUNITY): Payer: Self-pay

## 2023-12-03 ENCOUNTER — Other Ambulatory Visit: Payer: Self-pay

## 2023-12-03 ENCOUNTER — Other Ambulatory Visit (HOSPITAL_COMMUNITY): Payer: Self-pay

## 2023-12-29 ENCOUNTER — Other Ambulatory Visit (HOSPITAL_COMMUNITY): Payer: Self-pay

## 2024-01-07 ENCOUNTER — Ambulatory Visit: Admitting: Dermatology

## 2024-01-07 ENCOUNTER — Encounter: Payer: Self-pay | Admitting: Dermatology

## 2024-01-07 DIAGNOSIS — D485 Neoplasm of uncertain behavior of skin: Secondary | ICD-10-CM | POA: Diagnosis not present

## 2024-01-07 NOTE — Progress Notes (Signed)
   New Patient Visit   Subjective  Edwin Torres is a 68 y.o. male who presents for the following: spot on scalp  Pt has growth on the forehead for around 3 mths. Pt has no hx of skin cancer. He states that the lesion treated surgically several years ago but that it grew back. He is interested in having it removed. He is accompanied by his wife.  The following portions of the chart were reviewed this encounter and updated as appropriate: medications, allergies, medical history  Review of Systems:  No other skin or systemic complaints except as noted in HPI or Assessment and Plan.  Objective  Well appearing patient in no apparent distress; mood and affect are within normal limits.  A focused examination was performed of the following areas: forehead  Relevant exam findings are noted in the Assessment and Plan.  Right Forehead 2cm firm nodule   Assessment & Plan   Subcutaneous Nodule- Ddx Lipoma vs EIC vs other On examination, the patient has a small, well-circumscribed subcutaneous nodule. The differential diagnosis includes a lipoma, epidermal inclusion cyst (EIC), or other benign soft tissue lesion. These are most commonly benign and slow-growing, with a very low risk of malignancy. However, definitive diagnosis requires histopathologic evaluation following excision.  The treatment option of a wide local excision was discussed in detail, including removal of the lesion along with a small margin of surrounding normal tissue to ensure complete excision and minimize recurrence risk. The procedure can typically be performed under local anesthesia on an outpatient basis. Potential risks such as bleeding, infection, scarring, recurrence, or changes in sensation were reviewed. Depending on the size and location of the lesion, reconstructive options--including primary closure, local tissue advancement, or skin grafting--may be considered to optimize cosmetic and functional outcomes. The  patient expressed understanding and agreement with the proposed plan, and all questions were addressed.  NEOPLASM OF UNCERTAIN BEHAVIOR OF SKIN Right Forehead  Return for tbse with brenda, surgery for cyst right forehead.  I, Darice Smock, CMA, am acting as scribe for RUFUS CHRISTELLA HOLY, MD.   Documentation: I have reviewed the above documentation for accuracy and completeness, and I agree with the above.  RUFUS CHRISTELLA HOLY, MD

## 2024-01-07 NOTE — Patient Instructions (Signed)

## 2024-01-27 ENCOUNTER — Other Ambulatory Visit: Payer: Self-pay

## 2024-01-27 ENCOUNTER — Other Ambulatory Visit (HOSPITAL_COMMUNITY): Payer: Self-pay

## 2024-02-04 ENCOUNTER — Other Ambulatory Visit (HOSPITAL_COMMUNITY): Payer: Self-pay

## 2024-02-11 ENCOUNTER — Ambulatory Visit

## 2024-02-11 ENCOUNTER — Other Ambulatory Visit (HOSPITAL_COMMUNITY): Payer: Self-pay

## 2024-02-11 ENCOUNTER — Other Ambulatory Visit: Payer: Self-pay

## 2024-02-11 VITALS — Ht 63.0 in | Wt 145.0 lb

## 2024-02-11 DIAGNOSIS — Z1211 Encounter for screening for malignant neoplasm of colon: Secondary | ICD-10-CM

## 2024-02-11 DIAGNOSIS — R933 Abnormal findings on diagnostic imaging of other parts of digestive tract: Secondary | ICD-10-CM

## 2024-02-11 MED ORDER — NA SULFATE-K SULFATE-MG SULF 17.5-3.13-1.6 GM/177ML PO SOLN
1.0000 | Freq: Once | ORAL | 0 refills | Status: AC
  Filled 2024-02-11: qty 354, 1d supply, fill #0

## 2024-02-11 NOTE — Progress Notes (Signed)
 PCP MD at time of PV: Lucie Buttner, PA  __________________________________________________________________________________________________________________________________________  No egg allergy known to patient  No soy allergy known to patient No issues known to pt with past sedation with any surgeries or procedures Patient denies ever being told they had issues or difficulty with intubation  No FH of Malignant Hyperthermia Pt is not on diet pills Pt is not on  home 02  Pt is not on blood thinners  No A fib or A flutter Have any cardiac testing pending--no  LOA: independent  No Chew or Snuff tobacco __________________________________________________________________________________________________________________________________________  Constipation: yes OTC miralax  as needed  Prep: suprep  __________________________________________________________________________________________________________________________________________  PV completed with patient. Prep instructions reviewed and provided during apt. Rx sent to preferred pharmacy.  __________________________________________________________________________________________________________________________________________  Patient's chart reviewed by Norleen Schillings CNRA prior to previsit and patient appropriate for the [facility].  Previsit completed and red dot placed by patient's name on their procedure day (on provider's schedule).

## 2024-02-11 NOTE — Progress Notes (Unsigned)
 No issues known to pt with past sedation with any surgeries or procedures Patient denies ever being told they had issues or difficulty with intubation  No FH of Malignant Hyperthermia Pt is not on diet pills Pt is not on  home 02  Pt is not on blood thinners  Pt denies issues with constipation  No A fib or A flutter Have any cardiac testing pending--*** Pt instructed to use [employer] or GoodRx for a price reduction on prep

## 2024-02-21 ENCOUNTER — Other Ambulatory Visit: Payer: Self-pay | Admitting: Physician Assistant

## 2024-02-22 ENCOUNTER — Other Ambulatory Visit: Payer: Self-pay

## 2024-02-22 MED ORDER — TRAZODONE HCL 50 MG PO TABS
25.0000 mg | ORAL_TABLET | Freq: Every evening | ORAL | 3 refills | Status: AC | PRN
  Filled 2024-02-22: qty 30, 30d supply, fill #0
  Filled 2024-03-25: qty 30, 30d supply, fill #1

## 2024-02-26 ENCOUNTER — Ambulatory Visit: Admitting: Dermatology

## 2024-02-26 ENCOUNTER — Encounter: Payer: Self-pay | Admitting: Dermatology

## 2024-02-26 VITALS — BP 111/64 | HR 77 | Temp 98.6°F

## 2024-02-26 DIAGNOSIS — L720 Epidermal cyst: Secondary | ICD-10-CM | POA: Diagnosis not present

## 2024-02-26 DIAGNOSIS — L7211 Pilar cyst: Secondary | ICD-10-CM

## 2024-02-26 NOTE — Progress Notes (Signed)
 "  Follow-Up Visit   Subjective  Edwin Torres is a 69 y.o. male who presents for the following: Excision of a Subcutaneous nodule on the right forehead.  The following portions of the chart were reviewed this encounter and updated as appropriate: medications, allergies, medical history  Review of Systems:  No other skin or systemic complaints except as noted in HPI or Assessment and Plan.  Objective  Well appearing patient in no apparent distress; mood and affect are within normal limits.  A focused examination was performed of the following areas: face  Relevant physical exam findings are noted in the Assessment and Plan.   Right Forehead Subcutaneous nodule   Assessment & Plan   PILAR CYST Right Forehead - Skin excision - Right Forehead  Excision method:  elliptical Lesion length (cm):  2.3 Lesion width (cm):  2 Margin per side (cm):  0.1 Total excision diameter (cm):  2.5 Informed consent: discussed and consent obtained   Timeout: patient name, date of birth, surgical site, and procedure verified   Procedure prep:  Patient was prepped and draped in usual sterile fashion Prep type:  Chlorhexidine  Anesthesia: the lesion was anesthetized in a standard fashion   Anesthetic:  1% lidocaine  w/ epinephrine  1-100,000 buffered w/ 8.4% NaHCO3 Instrument used: #15 blade   Hemostasis achieved with: suture, pressure and electrodesiccation   Outcome: patient tolerated procedure well with no complications   Post-procedure details: wound care instructions given    - Skin repair - Right Forehead Complexity:  Complex Final length (cm):  4 Informed consent: discussed and consent obtained   Timeout: patient name, date of birth, surgical site, and procedure verified   Procedure prep:  Patient was prepped and draped in usual sterile fashion Prep type:  Chlorhexidine  Anesthesia: the lesion was anesthetized in a standard fashion   Anesthetic:  1% lidocaine  w/ epinephrine  1-100,000  buffered w/ 8.4% NaHCO3 Reason for type of repair: reduce tension to allow closure, allow closure of the large defect, preserve normal anatomy and avoid adjacent structures   Undermining: area extensively undermined   Subcutaneous layers (deep stitches):  Suture size:  5-0 Suture type: Monocryl (poliglecaprone 25)   Stitches:  Buried vertical mattress Fine/surface layer approximation (top stitches):  Suture size:  6-0 Suture type: fast-absorbing plain gut   Stitches: vertical mattress   Hemostasis achieved with: suture, pressure and electrodesiccation Outcome: patient tolerated procedure well with no complications   Post-procedure details: sterile dressing applied and wound care instructions given   Dressing type: bandage and pressure dressing    Specimen 1 - Surgical pathology Differential Diagnosis: cyst vs other  Check Margins: No  The surgical wound was then cleaned, prepped, and re-anesthetized as above. Wound edges were undermined extensively along at least one entire edge and at a distance equal to or greater than the width of the defect (see wound defect size above) in order to achieve closure and decrease wound tension and anatomic distortion. Redundant tissue repair including standing cone removal was performed. Hemostasis was achieved with electrocautery. Subcutaneous and epidermal tissues were approximated with the above sutures. The surgical site was then lightly scrubbed with sterile, saline-soaked gauze. The area was then bandaged using Vaseline ointment, non-adherent gauze, gauze pads, and tape to provide an adequate pressure dressing. The patient tolerated the procedure well, was given detailed written and verbal wound care instructions, and was discharged in good condition.   The patient will follow-up: PRN.  Return if symptoms worsen or fail to improve.  I,  Doyce Pan, CMA, am acting as scribe for RUFUS CHRISTELLA HOLY, MD.   Documentation: I have reviewed the above  documentation for accuracy and completeness, and I agree with the above.  RUFUS CHRISTELLA HOLY, MD  "

## 2024-02-26 NOTE — Patient Instructions (Signed)

## 2024-02-29 ENCOUNTER — Encounter: Payer: Self-pay | Admitting: Gastroenterology

## 2024-02-29 LAB — SURGICAL PATHOLOGY

## 2024-03-01 ENCOUNTER — Ambulatory Visit: Payer: Self-pay | Admitting: Dermatology

## 2024-03-03 ENCOUNTER — Encounter: Payer: Self-pay | Admitting: Gastroenterology

## 2024-03-03 ENCOUNTER — Ambulatory Visit: Admitting: Gastroenterology

## 2024-03-03 VITALS — BP 138/79 | HR 72 | Temp 98.2°F | Resp 17 | Ht 63.0 in | Wt 145.0 lb

## 2024-03-03 DIAGNOSIS — Z1211 Encounter for screening for malignant neoplasm of colon: Secondary | ICD-10-CM

## 2024-03-03 DIAGNOSIS — K2951 Unspecified chronic gastritis with bleeding: Secondary | ICD-10-CM | POA: Diagnosis not present

## 2024-03-03 DIAGNOSIS — K5730 Diverticulosis of large intestine without perforation or abscess without bleeding: Secondary | ICD-10-CM | POA: Diagnosis not present

## 2024-03-03 DIAGNOSIS — K2960 Other gastritis without bleeding: Secondary | ICD-10-CM

## 2024-03-03 DIAGNOSIS — K6289 Other specified diseases of anus and rectum: Secondary | ICD-10-CM

## 2024-03-03 DIAGNOSIS — K2940 Chronic atrophic gastritis without bleeding: Secondary | ICD-10-CM

## 2024-03-03 DIAGNOSIS — G8929 Other chronic pain: Secondary | ICD-10-CM

## 2024-03-03 DIAGNOSIS — K2950 Unspecified chronic gastritis without bleeding: Secondary | ICD-10-CM

## 2024-03-03 DIAGNOSIS — D122 Benign neoplasm of ascending colon: Secondary | ICD-10-CM | POA: Diagnosis not present

## 2024-03-03 DIAGNOSIS — R933 Abnormal findings on diagnostic imaging of other parts of digestive tract: Secondary | ICD-10-CM

## 2024-03-03 MED ORDER — SODIUM CHLORIDE 0.9 % IV SOLN: 500.0000 mL | INTRAVENOUS | Status: DC

## 2024-03-03 NOTE — Op Note (Signed)
 [facility] Patient Name: Edwin Torres Procedure Date: 03/03/2024 9:01 AM MRN: [id number] Endoscopist: Victory L. Legrand , MD, [id number] Age: 69 Referring MD:  Date of Birth: 28-Feb-1955 Gender: Male Account #: [id number] Procedure:                Upper GI endoscopy Indications:              Epigastric abdominal pain, Abnormal MRI of the GI                            tract                           Chronic pancreatitis (further clinical details in                            Dr. Hassan 11/19/2023 office note)                           Subsequent MRI abdomen/MRCP reporting possible                            inflammation of duodenum and cystic structure-?                            Diverticulum Medicines:                Monitored Anesthesia Care Procedure:                Pre-Anesthesia Assessment:                           - Prior to the procedure, a History and Physical                            was performed, and patient medications and                            allergies were reviewed. The patient's tolerance of                            previous anesthesia was also reviewed. The risks                            and benefits of the procedure and the sedation                            options and risks were discussed with the patient.                            All questions were answered, and informed consent                            was obtained. Prior Anticoagulants: The patient has                            taken no  anticoagulant or antiplatelet agents. ASA                            Grade Assessment: III - A patient with severe                            systemic disease. After reviewing the risks and                            benefits, the patient was deemed in satisfactory                            condition to undergo the procedure.                           After obtaining informed consent, the endoscope was                            passed under direct  vision. Throughout the                            procedure, the patient's blood pressure, pulse, and                            oxygen  saturations were monitored continuously. The                            GIF [id number] #[id number] was introduced through the                            mouth, and advanced to the third part of duodenum.                            The upper GI endoscopy was accomplished without                            difficulty. The patient tolerated the procedure                            well. Scope In: Scope Out: Findings:                 The larynx was normal.                           The esophagus was normal.                           Patchy inflammation characterized by adherent                            blood, congestion (edema), erosions, atrophy,                            erythema and nodularity was found in the gastric  antrum. Several biopsies were obtained in the                            gastric body and in the gastric antrum with cold                            forceps for histology. (Specimens in one pathology                            jar to rule out H. pylori and IHC requested due to                            antral mucosal atrophy)                           There was also patchy mucosal cobblestoning/edema                            in the gastric fundus and body.                           The exam of the stomach was otherwise normal,                            including on retroflexion.                           The examined duodenum was normal. No diverticulum,                            inflammation or other mucosal abnormality seen. Complications:            No immediate complications. Estimated Blood Loss:     Estimated blood loss was minimal. Impression:               - Normal larynx.                           - Normal esophagus.                           - Gastritis.                           - Normal examined  duodenum. Duodenal findings on                            MRI/MRCP likely sequela of pancreatitis                           - Several biopsies were obtained in the gastric                            body and in the gastric antrum. Recommendation:           - Patient has a contact number available for  emergencies. The signs and symptoms of potential                            delayed complications were discussed with the                            patient. Return to normal activities tomorrow.                            Written discharge instructions were provided to the                            patient.                           - Resume previous diet.                           - Continue present medications.                           - Await pathology results.                           - See the other procedure note for documentation of                            additional recommendations.                           - Reports will be sent to Dr. Leigh for                            further advice regarding management and follow-up. Leonardo Plaia L. Legrand, MD 03/03/2024 9:57:25 AM This report has been signed electronically.

## 2024-03-03 NOTE — Progress Notes (Signed)
 Pt's states no medical or surgical changes since previsit or office visit.

## 2024-03-03 NOTE — Progress Notes (Signed)
 History and Physical:  This patient presents for endoscopic testing for: Encounter Diagnoses  Name Primary?   Colon cancer screening Yes   Abdominal pain, chronic, epigastric    Abnormal finding on GI tract imaging     69 year old man well-known to Dr. Leigh and last seen in clinic 11/19/2023 regarding chronic pancreatitis and abdominal (epigastric) pain. Patient had a subsequent MRI abdomen/MRCP showing changes of chronic pancreatitis as expected from her prior CT scan as well as apparent inflammation of the second portion of the duodenum and a cystic structure in the duodenum that is possibly a diverticulum. He is also having his first screening colonoscopy.  Patient is otherwise without complaints or active issues today.   Past Medical History: Past Medical History:  Diagnosis Date   Alcohol addiction (HCC)    Allergy    Cancer (HCC)    Melanoma on face was removed   Depression    Herpes genitalis in men    History of basal cell carcinoma of skin    History of chicken pox    Pancreatitis 09/2023   Seizures (HCC)      Past Surgical History: Past Surgical History:  Procedure Laterality Date   FOOT FRACTURE SURGERY Bilateral    NOSE SURGERY     VASECTOMY      Allergies: Allergies[1]  Outpatient Meds: Current Outpatient Medications  Medication Sig Dispense Refill   CLEAR EYES TRIPLE ACTION 0.05-0.5-0.6 % SOLN Place 1 drop into both eyes 4 (four) times daily as needed (for dryness).     gabapentin  (NEURONTIN ) 400 MG capsule Take 1 capsule (400 mg total) by mouth 4 (four) times daily. (Patient not taking: Reported on 03/03/2024) 360 capsule 1   Ibuprofen  200 MG CAPS Take 600 mg by mouth every 8 (eight) hours as needed (for pain or headaches).     loratadine  (CLARITIN ) 10 MG tablet Take 10 mg by mouth daily as needed for allergies or rhinitis. (Patient not taking: No sig reported)     pantoprazole  (PROTONIX ) 40 MG tablet Take 1 tablet (40 mg total) by mouth daily.  (Patient not taking: No sig reported) 90 tablet 3   polyethylene glycol (MIRALAX  / GLYCOLAX ) 17 g packet Take 17 g by mouth daily. (Patient taking differently: Take 17 g by mouth daily as needed.)     pravastatin  (PRAVACHOL ) 10 MG tablet TAKE 1 TABLET BY MOUTH EVERY DAY (Patient not taking: No sig reported) 90 tablet 0   traZODone  (DESYREL ) 50 MG tablet Take 0.5-1 tablets (25-50 mg total) by mouth at bedtime as needed for sleep. 30 tablet 3   Current Facility-Administered Medications  Medication Dose Route Frequency Provider Last Rate Last Admin   0.9 %  sodium chloride  infusion  500 mL Intravenous Continuous Danis, Victory CROME III, MD          ___________________________________________________________________ Objective   Exam:  BP (!) 143/80   Pulse 97   Temp 98.2 F (36.8 C) (Temporal)   Ht 5' 3 (1.6 m)   Wt 145 lb (65.8 kg)   SpO2 97%   BMI 25.69 kg/m   CV: regular , S1/S2 Resp: clear to auscultation bilaterally, normal RR and effort noted GI: soft, no tenderness, with active bowel sounds.   Assessment: Encounter Diagnoses  Name Primary?   Colon cancer screening Yes   Abdominal pain, chronic, epigastric    Abnormal finding on GI tract imaging    MRI/MRCP report on file and reviewed today  Plan: Colonoscopy EGD  The benefits and  risks of the planned procedure(s) were described in detail with the patient or (when appropriate) their health care proxy.  Risks were outlined as including, but not limited to, bleeding, infection, perforation, adverse medication reaction leading to cardiac or pulmonary decompensation, pancreatitis (if ERCP).  The limitation of incomplete mucosal visualization was also discussed.  No guarantees or warranties were given.  The patient was provided an opportunity to ask questions and all were answered. The patient agreed with the plan.   The patient is appropriate for an endoscopic procedure in the ambulatory setting.   - Victory Brand,  MD        [1]  Allergies Allergen Reactions   Penicillins Anaphylaxis    Has patient had a PCN reaction causing immediate rash, facial/tongue/throat swelling, SOB or lightheadedness with hypotension: Yes Has patient had a PCN reaction causing severe rash involving mucus membranes or skin necrosis: No Has patient had a PCN reaction that required hospitalization: Yes Has patient had a PCN reaction occurring within the last 10 years: No If all of the above answers are NO, then may proceed with Cephalosporin use.    Ativan  [Lorazepam ] Other (See Comments)    Made him crazy

## 2024-03-03 NOTE — Progress Notes (Signed)
 Called to room to assist during endoscopic procedure.  Patient ID and intended procedure confirmed with present staff. Received instructions for my participation in the procedure from the performing physician.

## 2024-03-03 NOTE — Op Note (Signed)
 [facility] Patient Name: Edwin Torres Procedure Date: 03/03/2024 9:02 AM MRN: [id number] Endoscopist: Victory L. Legrand , MD, [id number] Age: 69 Referring MD:  Date of Birth: Jan 18, 1956 Gender: Male Account #: [id number] Procedure:                Colonoscopy Indications:              Screening for colorectal malignant neoplasm, This                            is the patient's first colonoscopy Medicines:                Monitored Anesthesia Care Procedure:                Pre-Anesthesia Assessment:                           - Prior to the procedure, a History and Physical                            was performed, and patient medications and                            allergies were reviewed. The patient's tolerance of                            previous anesthesia was also reviewed. The risks                            and benefits of the procedure and the sedation                            options and risks were discussed with the patient.                            All questions were answered, and informed consent                            was obtained. Prior Anticoagulants: The patient has                            taken no anticoagulant or antiplatelet agents. ASA                            Grade Assessment: III - A patient with severe                            systemic disease. After reviewing the risks and                            benefits, the patient was deemed in satisfactory                            condition to undergo the procedure.  After obtaining informed consent, the colonoscope                            was passed under direct vision. Throughout the                            procedure, the patient's blood pressure, pulse, and                            oxygen  saturations were monitored continuously. The                            CF [id number] #[id number] was introduced through the anus                            and advanced to the  the cecum, identified by                            appendiceal orifice and ileocecal valve. The                            colonoscopy was somewhat difficult due to poor                            bowel prep. Successful completion of the procedure                            was aided by lavage. The patient tolerated the                            procedure well. The quality of the bowel                            preparation was poor (see details below-remained                            fair throughout despite extensive lavage). The                            ileocecal valve, appendiceal orifice, and rectum                            were photographed. The bowel preparation used was                            SUPREP. Scope In: 9:13:56 AM Scope Out: 9:35:16 AM Scope Withdrawal Time: 0 hours 14 minutes 31 seconds  Total Procedure Duration: 0 hours 21 minutes 20 seconds  Findings:                 The digital rectal exam findings include decreased                            sphincter tone.  Repeat examination of right colon under NBI                            performed.                           A large amount of semi-liquid stool was found in                            the entire colon, interfering with visualization.                            Lavage of the area was performed using a large                            amount, resulting in incomplete clearance with fair                            visualization.                           A diminutive polyp was found in the distal                            ascending colon. The polyp was sessile. The polyp                            was removed with a cold snare. Resection and                            retrieval were complete.                           A few small-mouthed diverticula were found in the                            right colon.                           The exam was otherwise without abnormality on                             direct and retroflexion views. Complications:            No immediate complications. Estimated Blood Loss:     Estimated blood loss was minimal. Impression:               - Preparation of the colon was poor.                           - Decreased sphincter tone found on digital rectal                            exam.                           -  Stool in the entire examined colon.                           - One diminutive polyp in the distal ascending                            colon, removed with a cold snare. Resected and                            retrieved.                           - Diverticulosis in the right colon.                           - The examination was otherwise normal on direct                            and retroflexion views. Recommendation:           - Patient has a contact number available for                            emergencies. The signs and symptoms of potential                            delayed complications were discussed with the                            patient. Return to normal activities tomorrow.                            Written discharge instructions were provided to the                            patient.                           - Resume previous diet.                           - Continue present medications.                           - Await pathology results.                           - Repeat colonoscopy in 1 year because the bowel                            preparation was poor and for screening purposes.                            (Set recall with Dr. Leigh, patient's primary  GI physician at this practice. GoLytely  prep for                            next exam)                           - Await pathology results. Amal Saiki L. Legrand, MD 03/03/2024 9:41:26 AM This report has been signed electronically.

## 2024-03-03 NOTE — Patient Instructions (Signed)
 Please read handouts provided. Continue present medications. Resume previous diet. Await pathology results. Repeat colonoscopy in 1 year.   YOU HAD AN ENDOSCOPIC PROCEDURE TODAY AT THE [facility]:   Refer to the procedure report that was given to you for any specific questions about what was found during the examination.  If the procedure report does not answer your questions, please call your gastroenterologist to clarify.  If you requested that your care partner not be given the details of your procedure findings, then the procedure report has been included in a sealed envelope for you to review at your convenience later.  YOU SHOULD EXPECT: Some feelings of bloating in the abdomen. Passage of more gas than usual.  Walking can help get rid of the air that was put into your GI tract during the procedure and reduce the bloating. If you had a lower endoscopy (such as a colonoscopy or flexible sigmoidoscopy) you may notice spotting of blood in your stool or on the toilet paper. If you underwent a bowel prep for your procedure, you may not have a normal bowel movement for a few days.  Please Note:  You might notice some irritation and congestion in your nose or some drainage.  This is from the oxygen  used during your procedure.  There is no need for concern and it should clear up in a day or so.  SYMPTOMS TO REPORT IMMEDIATELY:  Following lower endoscopy (colonoscopy or flexible sigmoidoscopy):  Excessive amounts of blood in the stool  Significant tenderness or worsening of abdominal pains  Swelling of the abdomen that is new, acute  Fever of 100F or higher  Following upper endoscopy (EGD)  Vomiting of blood or coffee ground material  New chest pain or pain under the shoulder blades  Painful or persistently difficult swallowing  New shortness of breath  Fever of 100F or higher  Black, tarry-looking stools  For urgent or emergent issues, a gastroenterologist can be reached at  any hour by calling [phone]. Do not use MyChart messaging for urgent concerns.    DIET:  We do recommend a small meal at first, but then you may proceed to your regular diet.  Drink plenty of fluids but you should avoid alcoholic beverages for 24 hours.  ACTIVITY:  You should plan to take it easy for the rest of today and you should NOT DRIVE or use heavy machinery until tomorrow (because of the sedation medicines used during the test).    FOLLOW UP: Our staff will call the number listed on your records the next business day following your procedure.  We will call around 7:15- 8:00 am to check on you and address any questions or concerns that you may have regarding the information given to you following your procedure. If we do not reach you, we will leave a message.     If any biopsies were taken you will be contacted by phone or by letter within the next 1-3 weeks.  Please call us  at [phone] if you have not heard about the biopsies in 3 weeks.    SIGNATURES/CONFIDENTIALITY: You and/or your care partner have signed paperwork which will be entered into your electronic medical record.  These signatures attest to the fact that that the information above on your After Visit Summary has been reviewed and is understood.  Full responsibility of the confidentiality of this discharge information lies with you and/or your care-partner.

## 2024-03-03 NOTE — Progress Notes (Signed)
 Vss nad trans to pacu

## 2024-03-04 ENCOUNTER — Telehealth: Payer: Self-pay

## 2024-03-04 NOTE — Telephone Encounter (Signed)
" °  Follow up Call-     03/03/2024    8:04 AM  Call back number  Post procedure Call Back phone  # [phone]  Permission to leave phone message Yes     Patient questions:  Do you have a fever, pain , or abdominal swelling? No. Pain Score  0 *  Have you tolerated food without any problems? Yes.    Have you been able to return to your normal activities? Yes.    Do you have any questions about your discharge instructions: Diet   No. Medications  No. Follow up visit  No.  Do you have questions or concerns about your Care? No.  Actions: * If pain score is 4 or above: No action needed, pain <4.   "

## 2024-03-07 ENCOUNTER — Ambulatory Visit: Payer: Self-pay | Admitting: Gastroenterology

## 2024-03-07 LAB — SURGICAL PATHOLOGY

## 2024-03-25 ENCOUNTER — Other Ambulatory Visit (HOSPITAL_COMMUNITY): Payer: Self-pay

## 2024-03-25 ENCOUNTER — Other Ambulatory Visit: Payer: Self-pay

## 2024-04-06 ENCOUNTER — Encounter: Payer: PPO | Admitting: Physician Assistant

## 2024-04-20 ENCOUNTER — Ambulatory Visit: Admitting: Physician Assistant

## 2024-07-28 ENCOUNTER — Ambulatory Visit
